# Patient Record
Sex: Female | Born: 1955 | Race: White | Hispanic: No | Marital: Single | Smoking: Current every day smoker
Health system: Southern US, Community
[De-identification: ages and names within clinical notes are randomized; demographics above are authoritative.]

## PROBLEM LIST (undated history)

## (undated) DIAGNOSIS — F32A Depression, unspecified: Secondary | ICD-10-CM

## (undated) DIAGNOSIS — Z5189 Encounter for other specified aftercare: Secondary | ICD-10-CM

## (undated) DIAGNOSIS — F4310 Post-traumatic stress disorder, unspecified: Secondary | ICD-10-CM

## (undated) DIAGNOSIS — C801 Malignant (primary) neoplasm, unspecified: Secondary | ICD-10-CM

## (undated) DIAGNOSIS — G4700 Insomnia, unspecified: Secondary | ICD-10-CM

## (undated) DIAGNOSIS — I739 Peripheral vascular disease, unspecified: Secondary | ICD-10-CM

## (undated) DIAGNOSIS — F419 Anxiety disorder, unspecified: Secondary | ICD-10-CM

## (undated) DIAGNOSIS — D219 Benign neoplasm of connective and other soft tissue, unspecified: Secondary | ICD-10-CM

## (undated) DIAGNOSIS — K219 Gastro-esophageal reflux disease without esophagitis: Secondary | ICD-10-CM

## (undated) DIAGNOSIS — E785 Hyperlipidemia, unspecified: Secondary | ICD-10-CM

## (undated) DIAGNOSIS — IMO0002 Reserved for concepts with insufficient information to code with codable children: Secondary | ICD-10-CM

## (undated) DIAGNOSIS — D649 Anemia, unspecified: Secondary | ICD-10-CM

## (undated) DIAGNOSIS — R7303 Prediabetes: Secondary | ICD-10-CM

## (undated) DIAGNOSIS — F329 Major depressive disorder, single episode, unspecified: Secondary | ICD-10-CM

## (undated) DIAGNOSIS — T7840XA Allergy, unspecified, initial encounter: Secondary | ICD-10-CM

## (undated) DIAGNOSIS — Z9289 Personal history of other medical treatment: Secondary | ICD-10-CM

## (undated) DIAGNOSIS — I10 Essential (primary) hypertension: Secondary | ICD-10-CM

## (undated) HISTORY — DX: Allergy, unspecified, initial encounter: T78.40XA

## (undated) HISTORY — PX: KNEE ARTHROSCOPY: SUR90

## (undated) HISTORY — DX: Anemia, unspecified: D64.9

## (undated) HISTORY — DX: Benign neoplasm of connective and other soft tissue, unspecified: D21.9

## (undated) HISTORY — DX: Prediabetes: R73.03

## (undated) HISTORY — DX: Anxiety disorder, unspecified: F41.9

## (undated) HISTORY — DX: Encounter for other specified aftercare: Z51.89

## (undated) HISTORY — PX: COLONOSCOPY: SHX174

---

## 1980-02-11 DIAGNOSIS — Z5189 Encounter for other specified aftercare: Secondary | ICD-10-CM

## 1980-02-11 HISTORY — DX: Encounter for other specified aftercare: Z51.89

## 1980-02-11 HISTORY — PX: OTHER SURGICAL HISTORY: SHX169

## 1997-06-02 ENCOUNTER — Observation Stay (HOSPITAL_COMMUNITY): Admission: EM | Admit: 1997-06-02 | Discharge: 1997-06-03 | Payer: Self-pay | Admitting: Emergency Medicine

## 1999-09-17 ENCOUNTER — Encounter: Admission: RE | Admit: 1999-09-17 | Discharge: 1999-09-17 | Payer: Self-pay | Admitting: Family Medicine

## 1999-09-17 ENCOUNTER — Encounter: Payer: Self-pay | Admitting: Family Medicine

## 2001-04-11 ENCOUNTER — Encounter: Payer: Self-pay | Admitting: Emergency Medicine

## 2001-04-11 ENCOUNTER — Emergency Department (HOSPITAL_COMMUNITY): Admission: EM | Admit: 2001-04-11 | Discharge: 2001-04-11 | Payer: Self-pay | Admitting: Emergency Medicine

## 2001-06-30 ENCOUNTER — Encounter: Payer: Self-pay | Admitting: Orthopaedic Surgery

## 2001-06-30 ENCOUNTER — Ambulatory Visit (HOSPITAL_COMMUNITY): Admission: RE | Admit: 2001-06-30 | Discharge: 2001-06-30 | Payer: Self-pay | Admitting: Orthopaedic Surgery

## 2001-09-08 ENCOUNTER — Encounter: Payer: Self-pay | Admitting: Internal Medicine

## 2001-09-08 ENCOUNTER — Other Ambulatory Visit: Admission: RE | Admit: 2001-09-08 | Discharge: 2001-09-08 | Payer: Self-pay | Admitting: Internal Medicine

## 2001-09-08 ENCOUNTER — Ambulatory Visit (HOSPITAL_COMMUNITY): Admission: RE | Admit: 2001-09-08 | Discharge: 2001-09-08 | Payer: Self-pay | Admitting: Internal Medicine

## 2001-12-17 ENCOUNTER — Encounter: Admission: RE | Admit: 2001-12-17 | Discharge: 2001-12-17 | Payer: Self-pay | Admitting: Family Medicine

## 2002-02-15 ENCOUNTER — Encounter: Admission: RE | Admit: 2002-02-15 | Discharge: 2002-02-15 | Payer: Self-pay | Admitting: Sports Medicine

## 2002-03-15 ENCOUNTER — Encounter: Admission: RE | Admit: 2002-03-15 | Discharge: 2002-03-15 | Payer: Self-pay | Admitting: Sports Medicine

## 2003-07-18 ENCOUNTER — Encounter: Admission: RE | Admit: 2003-07-18 | Discharge: 2003-07-18 | Payer: Self-pay | Admitting: Sports Medicine

## 2003-12-29 ENCOUNTER — Ambulatory Visit: Payer: Self-pay | Admitting: Sports Medicine

## 2004-03-05 ENCOUNTER — Ambulatory Visit: Payer: Self-pay | Admitting: Sports Medicine

## 2004-09-13 ENCOUNTER — Other Ambulatory Visit: Admission: RE | Admit: 2004-09-13 | Discharge: 2004-09-13 | Payer: Self-pay | Admitting: Internal Medicine

## 2004-11-20 ENCOUNTER — Ambulatory Visit: Payer: Self-pay | Admitting: Sports Medicine

## 2004-11-20 ENCOUNTER — Encounter: Admission: RE | Admit: 2004-11-20 | Discharge: 2004-11-20 | Payer: Self-pay | Admitting: Sports Medicine

## 2004-11-23 ENCOUNTER — Encounter: Admission: RE | Admit: 2004-11-23 | Discharge: 2004-11-23 | Payer: Self-pay | Admitting: Sports Medicine

## 2004-12-03 ENCOUNTER — Ambulatory Visit: Payer: Self-pay | Admitting: Sports Medicine

## 2004-12-06 ENCOUNTER — Emergency Department (HOSPITAL_COMMUNITY): Admission: EM | Admit: 2004-12-06 | Discharge: 2004-12-06 | Payer: Self-pay | Admitting: Emergency Medicine

## 2006-04-07 ENCOUNTER — Ambulatory Visit: Payer: Self-pay | Admitting: Sports Medicine

## 2006-11-05 ENCOUNTER — Other Ambulatory Visit: Admission: RE | Admit: 2006-11-05 | Discharge: 2006-11-05 | Payer: Self-pay | Admitting: Internal Medicine

## 2007-03-31 ENCOUNTER — Ambulatory Visit: Payer: Self-pay | Admitting: Sports Medicine

## 2007-03-31 DIAGNOSIS — S339XXA Sprain of unspecified parts of lumbar spine and pelvis, initial encounter: Secondary | ICD-10-CM | POA: Insufficient documentation

## 2007-03-31 DIAGNOSIS — S335XXA Sprain of ligaments of lumbar spine, initial encounter: Secondary | ICD-10-CM

## 2007-04-23 ENCOUNTER — Telehealth: Payer: Self-pay | Admitting: *Deleted

## 2007-12-07 ENCOUNTER — Ambulatory Visit: Payer: Self-pay | Admitting: Sports Medicine

## 2007-12-07 DIAGNOSIS — IMO0002 Reserved for concepts with insufficient information to code with codable children: Secondary | ICD-10-CM | POA: Insufficient documentation

## 2007-12-30 ENCOUNTER — Ambulatory Visit: Payer: Self-pay | Admitting: Sports Medicine

## 2008-06-28 ENCOUNTER — Ambulatory Visit: Payer: Self-pay | Admitting: Sports Medicine

## 2008-06-28 DIAGNOSIS — M25539 Pain in unspecified wrist: Secondary | ICD-10-CM | POA: Insufficient documentation

## 2009-05-29 ENCOUNTER — Encounter: Admission: RE | Admit: 2009-05-29 | Discharge: 2009-05-29 | Payer: Self-pay | Admitting: Internal Medicine

## 2009-08-21 ENCOUNTER — Ambulatory Visit: Payer: Self-pay | Admitting: Family Medicine

## 2009-08-21 DIAGNOSIS — M5412 Radiculopathy, cervical region: Secondary | ICD-10-CM | POA: Insufficient documentation

## 2009-12-25 ENCOUNTER — Telehealth: Payer: Self-pay | Admitting: Sports Medicine

## 2010-01-29 ENCOUNTER — Ambulatory Visit: Payer: Self-pay | Admitting: Sports Medicine

## 2010-03-14 NOTE — Assessment & Plan Note (Signed)
Summary: [facility]   Vital Signs:  Patient profile:   55 year old female Height:      66 inches Weight:      165 pounds Pulse rate:   70 / minute Resp:     16 per minute  History of Present Illness: pleasant 55 year old patient of Dr. Jettie Booze who has a long-standing history of cervical spine issues with radiculopathy and chronic pain. This is been well controlled with the gabapentin which she ran out of her on, she started having more pain and neuropathic symptoms. She also takes him Vicoprofen intermittently, but typically take about half a tablet of this irregularly.  She also does do some self mechanical traction with 2 different devices and occasionally does some manipulation by chiropractic.  Review of systems as above. No fevers or chills.  GEN: Well-developed,well-nourished,in no acute distress; alert,appropriate and cooperative throughout examination HEENT: Normocephalic and atraumatic without obvious abnormalities. No apparent alopecia or balding. Ears, externally no deformities PULM: Breathing comfortably in no respiratory distress EXT: No clubbing, cyanosis, or edema PSYCH: Normally interactive. Cooperative during the interview. Pleasant. Friendly and conversant. Not anxious or depressed appearing. Normal, full affect.   Range of motion cervical spine is actually fairly good and well preserved, however the patient does have a positive Spurling's maneuver the causes radiculopathy. Some pain and tenderness in the paracervical region around C4. Some trapezius tightness.  Allergies: No Known Drug Allergies  Past History:  Past medical, surgical, family and social histories (including risk factors) reviewed, and no changes noted (except as noted below).  Past Medical History: Reviewed history from 03/31/2007 and no changes required. anxiety on xanax 0.5 mg hs  Family History: Reviewed history and no changes required.  Social History: Reviewed history from  12/30/2007 and no changes required. teaches dance at [employer]   Impression & Recommendations:  Problem # 1:  CERVICAL RADICULOPATHY (ICD-723.4) reasonable to continue with chronic Neurontin, and occasional Vicoprofen use is reasonable as well.  I did review the patient's plain x-rays as well as her cervical spine MRI, there clearly is a large diffuse disc bulge.  Complete Medication List: 1)  Vicoprofen 7.5-200 Mg Tabs (Hydrocodone-ibuprofen) .... Take 1 tablet by mouth four times a day 2)  Neurontin 300 Mg Caps (Gabapentin) .... Take one capsule three times a day 3)  Voltaren 1 % Gel (Diclofenac sodium) .... Apply 4g to affected area qid Prescriptions: NEURONTIN 300 MG  CAPS (GABAPENTIN) take one capsule three times a day  #90 x 11   Entered and Authorized by:   Hannah Beat MD   Signed by:   Hannah Beat MD on 08/21/2009   Method used:   Print then Give to Patient   RxID:   [id number] VICOPROFEN 7.5-200 MG TABS (HYDROCODONE-IBUPROFEN) Take 1 tablet by mouth four times a day  #100 x 0   Entered and Authorized by:   Hannah Beat MD   Signed by:   Hannah Beat MD on 08/21/2009   Method used:   Print then Give to Patient   RxID:   [id number]

## 2010-03-14 NOTE — Progress Notes (Signed)
Summary: hydrocodone  Phone Note Refill Request Message from:  Fax from Pharmacy on December 25, 2009 1:53 PM  Refills Requested: Medication #1:  VICOPROFEN 7.5-200 MG TABS Take 1 tablet by mouth four times a day   Last Refilled: 08/21/2009 Refill request from [facility] [address] [phone].   Initial call taken by: Melody Comas,  December 25, 2009 2:20 PM  Follow-up for Phone Call        Call received at [facility], but patient of Dr. Darrick Penna - ?PCP , will forward to him. Hannah Beat MD  December 25, 2009 2:51 PM   Additional Follow-up for Phone Call Additional follow up Details #1::        Called in per Dr. Darrick Penna. Additional Follow-up by: Rochele Pages RN,  December 25, 2009 5:05 PM    Prescriptions: VICOPROFEN 7.5-200 MG TABS (HYDROCODONE-IBUPROFEN) Take 1 tablet by mouth four times a day  #100 x 1   Entered by:   Rochele Pages RN   Authorized by:   Enid Baas MD   Signed by:   Rochele Pages RN on 12/25/2009   Method used:   Telephoned to ...       [facility] # [phone]* (retail)       [address]       [place], [place]  [address]       Ph: [phone]       Fax: [phone]   RxID:   [id number]

## 2010-04-22 ENCOUNTER — Encounter: Payer: Self-pay | Admitting: *Deleted

## 2010-08-12 ENCOUNTER — Emergency Department (HOSPITAL_BASED_OUTPATIENT_CLINIC_OR_DEPARTMENT_OTHER)
Admission: EM | Admit: 2010-08-12 | Discharge: 2010-08-12 | Disposition: A | Discharge status: Home / Self Care | Payer: BC Managed Care – PPO | Attending: Emergency Medicine | Admitting: Emergency Medicine

## 2010-08-12 DIAGNOSIS — T63461A Toxic effect of venom of wasps, accidental (unintentional), initial encounter: Secondary | ICD-10-CM | POA: Insufficient documentation

## 2010-08-12 DIAGNOSIS — T6391XA Toxic effect of contact with unspecified venomous animal, accidental (unintentional), initial encounter: Secondary | ICD-10-CM | POA: Insufficient documentation

## 2010-08-12 DIAGNOSIS — Z79899 Other long term (current) drug therapy: Secondary | ICD-10-CM | POA: Insufficient documentation

## 2010-08-28 ENCOUNTER — Other Ambulatory Visit: Payer: Self-pay | Admitting: Family Medicine

## 2010-08-28 NOTE — Telephone Encounter (Signed)
Refused Rx.  Notified pharmacy that patient should contact Dr. Darrick Penna for refills.

## 2010-08-28 NOTE — Telephone Encounter (Signed)
I refilled last time, but Dr. Darrick Penna has seen for years. I would rather pass on to [facility].

## 2010-08-29 ENCOUNTER — Other Ambulatory Visit: Payer: Self-pay | Admitting: *Deleted

## 2010-08-29 DIAGNOSIS — M5412 Radiculopathy, cervical region: Secondary | ICD-10-CM

## 2010-08-29 MED ORDER — GABAPENTIN 300 MG PO CAPS: 300.0000 mg | ORAL_CAPSULE | Freq: Three times a day (TID) | ORAL | Status: DC

## 2010-09-12 ENCOUNTER — Other Ambulatory Visit (HOSPITAL_COMMUNITY)
Admission: RE | Admit: 2010-09-12 | Discharge: 2010-09-12 | Disposition: A | Discharge status: Home / Self Care | Payer: BC Managed Care – PPO | Source: Ambulatory Visit | Attending: Internal Medicine | Admitting: Internal Medicine

## 2010-09-12 ENCOUNTER — Encounter: Payer: Self-pay | Admitting: Sports Medicine

## 2010-09-12 ENCOUNTER — Other Ambulatory Visit: Payer: Self-pay | Admitting: Internal Medicine

## 2010-09-12 ENCOUNTER — Ambulatory Visit (INDEPENDENT_AMBULATORY_CARE_PROVIDER_SITE_OTHER): Payer: BC Managed Care – PPO | Admitting: Sports Medicine

## 2010-09-12 VITALS — BP 125/87 | HR 94 | Ht 66.0 in | Wt 166.0 lb

## 2010-09-12 DIAGNOSIS — Z01419 Encounter for gynecological examination (general) (routine) without abnormal findings: Secondary | ICD-10-CM | POA: Insufficient documentation

## 2010-09-12 DIAGNOSIS — S339XXA Sprain of unspecified parts of lumbar spine and pelvis, initial encounter: Secondary | ICD-10-CM

## 2010-09-12 DIAGNOSIS — M5412 Radiculopathy, cervical region: Secondary | ICD-10-CM

## 2010-09-12 DIAGNOSIS — F4323 Adjustment disorder with mixed anxiety and depressed mood: Secondary | ICD-10-CM | POA: Insufficient documentation

## 2010-09-12 NOTE — Progress Notes (Signed)
  Subjective:    Patient ID: Gabriela [name], female    DOB: April 26, 1955, 55 y.o.   MRN: [id number]  HPI Patient enters with several concerns. We have treated her for cervical radiculopathy and also for low back pain in the past. If she does not take her gabapentin she has had more difficulty with cervical radiculopathy primarily but also noticed that he's she gets more low back tightness.  During the past 2 years she's had a significant increase in personal stressors and job stresses. This has culminated in the breakup of long-term marriage. Since the stress has increased in the last 3 months she has had a number of panic attacks. Her primary care physician has done a good job of managing days in helping her titrate and lessen the amount of Xanax that she uses. Patient however has declined using antidepressant medicine but she feels that most of the problems are situational. She has agreed to go to counseling and has a Office manager in [place]. Most recently she has also seen Daiva Eves is an MSW therapist and he works with traumatic stress situations.  Counseling does seem to be helping. However she continues to have some frequent irregular heartbeats symptoms of anxiety and sometimes chest tightness. EKGs and evaluation by her primary care physician and the emergency department on 1 visit have been unremarkable. She now is having more difficulty sleeping through the like without waking up. This increases the musculoskeletal symptoms that she has both in her low back and neck.  On a secondary note she has some abdominal strain while she was working in her garden. She wants some advice regarding this.  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   Review of Systems     Objective:   Physical Exam  Tearful but appropriate and she does show appropriate affect during the course of her  discussion  Coronary exam is repeated today and there are very rare irregular beats but no significant abnormalities noted  Neck motion is good and gross neurological evaluation is unremarkable  Neck motion is good  Abdominal exam shows some tightness and even some mild tenderness along the lateral rectus sheath to the left at the insertion of the oblique muscles      Assessment & Plan:

## 2010-09-12 NOTE — Assessment & Plan Note (Signed)
Her acute injury has resolved but she now has some abdominal strain. Thank urged in a series of abdominal and low back exercises to help prevent these type of injuries.

## 2010-09-12 NOTE — Assessment & Plan Note (Signed)
She seemed to respond very well to gabapentin so I am hoping the increased dose of this will allow her to get better rest. If this is the case the counseling may be enough to get her through the situational problems. She can also try weaning the Xanax if the gabapentin does not help her rest. If she is not responding adequately she should return to her primary care physician for further assessment of this.

## 2010-09-12 NOTE — Patient Instructions (Signed)
Check out DASH diet at [facility] website  Start with Gabapentin 300 in AM and 600 PM for 1 week  If improving Then 300 AM/ 300 Noon and 600 PM for 2 weeks  If needed you can titrate up to 600 three times a day increasing dose by no more than 300 per week  Xanax After the Gabapentin has been stable for 1 week Then Xanax .5 at night alternate with .25 (half tab)  That is for 2 weeks  If stable then Xanax 0.25 nightly for 1 month  Abdominal exercises as described Don't push through soreness Keep up back program

## 2010-09-12 NOTE — Assessment & Plan Note (Signed)
I would like to increase her dose of gabapentin as documented in the patient instructions  If she gets better sleep and more muscle relaxation I think she will have less radicular symptoms

## 2010-09-19 ENCOUNTER — Ambulatory Visit (INDEPENDENT_AMBULATORY_CARE_PROVIDER_SITE_OTHER): Payer: BC Managed Care – PPO | Admitting: Sports Medicine

## 2010-09-19 VITALS — BP 133/83

## 2010-09-19 DIAGNOSIS — S300XXA Contusion of lower back and pelvis, initial encounter: Secondary | ICD-10-CM

## 2010-09-19 DIAGNOSIS — M533 Sacrococcygeal disorders, not elsewhere classified: Secondary | ICD-10-CM

## 2010-09-21 DIAGNOSIS — S300XXA Contusion of lower back and pelvis, initial encounter: Secondary | ICD-10-CM | POA: Insufficient documentation

## 2010-09-21 NOTE — Progress Notes (Signed)
  Subjective:    Patient ID: Gabriela [name], female    DOB: 1955-05-27, 55 y.o.   MRN: [id number]  HPI  Gabriela [name] is a pleasant 55 yo patient coming today complaining of coccyx pain after falling on her bottom while walking down steps in the pool on Monday 09/16/10. 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 Her pain is localized in the coccyx area, 4/10 intensity, improved with rest, ice, motrin  and not putting pressure in her coccyx, worsen by sitting position and by certain movements. No numbness or tingling on her legs.  She is using a shoe design pillow to take pressure from her coccyx while sitting.  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 She starts school in 2 weeks.  No past medical history on file. Current Outpatient Prescriptions on File Prior to Visit  Medication Sig Dispense Refill  . ALPRAZolam (XANAX) 0.5 MG tablet Take 0.5 mg by mouth as needed.      Marland Kitchen EPIPEN 2-PAK 0.3 MG/0.3ML DEVI as needed.      . gabapentin (NEURONTIN) 300 MG capsule Take 1 capsule (300 mg total) by mouth 3 (three) times daily.  90 capsule  0  . HYDROcodone-ibuprofen (VICOPROFEN) 7.5-200 MG per tablet Take 1 tablet by mouth 4 (four) times daily.         Allergies  Allergen Reactions  . Prednisone Other (See Comments)    Gets very manic     Review of Systems  Musculoskeletal: Negative for gait problem.       Coccyx pain with HPI  Skin: Negative for color change, pallor and rash.       Objective:   Physical Exam  Constitutional: She appears well-developed and well-nourished.       BP 133/83   Eyes: EOM are normal.  Pulmonary/Chest: Effort normal.  Musculoskeletal:       Sacral area with no swelling, no hematoma. There is TTP in the coccyx at the level of the tip and second coccyx vertebrae. No crepitus. There is increase anterior curvature of the coccyx. Sensation intact distally. LB w/ no pain,  no TTP. Normal gait  Skin: No rash noted. No erythema. No pallor.  Psychiatric: She has a normal mood and affect. Judgment normal.    MSK U/S: with hypoechoic image in between 1st and 2nd coccyx vertebrae, insinuating post traumatic swelling in that area. Coincide w/ area of TTP       Assessment & Plan:   1. Coccyx contusion   2. Coccyxdynia    Recommended to continue using the shoe horse pillow. Recommended to continue motrin for pain control. Recommended to avoid direct pressure on the sacral area. Physical activity as tolerated. Avoid hihg impact activity F/U in 4 weeks

## 2010-09-26 ENCOUNTER — Encounter: Payer: Self-pay | Admitting: Family Medicine

## 2010-09-26 ENCOUNTER — Ambulatory Visit (INDEPENDENT_AMBULATORY_CARE_PROVIDER_SITE_OTHER): Payer: BC Managed Care – PPO | Admitting: Family Medicine

## 2010-09-26 VITALS — BP 113/77 | HR 73 | Ht 66.0 in | Wt 157.0 lb

## 2010-09-26 DIAGNOSIS — S300XXA Contusion of lower back and pelvis, initial encounter: Secondary | ICD-10-CM

## 2010-09-26 NOTE — Progress Notes (Signed)
  Subjective:    Patient ID: Gabriela [name], female    DOB: 16-Nov-1955, 55 y.o.   MRN: [id number]  HPI Gabriela [name] is coming today to F/U on her coccyx fracture. She had her initial injury on 09/16/10. She states that her pain is the same, dull ache, 4/10, in the coccyx area, worsen by sitting and putting pressure on her coccyx, improved by  Removing pressure from her coccyx. She has been able to do exercises and stretches with mild discomfort. No numbness or tingling. No limping. She is able to defecate.  Patient Active Problem List  Diagnoses  . WRIST PAIN, LEFT  . CERVICAL RADICULOPATHY  . PES ANSERINUS TENDINITIS OR BURSITIS  . LUMBOSACRAL STRAIN, ACUTE  . Adjustment disorder with mixed anxiety and depressed mood  . Coccyx contusion     Current Outpatient Prescriptions on File Prior to Visit  Medication Sig Dispense Refill  . ALPRAZolam (XANAX) 0.5 MG tablet Take 0.5 mg by mouth as needed.      Marland Kitchen EPIPEN 2-PAK 0.3 MG/0.3ML DEVI as needed.      . gabapentin (NEURONTIN) 300 MG capsule Take 1 capsule (300 mg total) by mouth 3 (three) times daily.  90 capsule  0  . HYDROcodone-ibuprofen (VICOPROFEN) 7.5-200 MG per tablet Take 1 tablet by mouth 4 (four) times daily.         Allergies  Allergen Reactions  . Prednisone Other (See Comments)    Gets very manic     Review of Systems  Constitutional: Negative for fever, chills, diaphoresis and fatigue.  Gastrointestinal: Negative for constipation.  Genitourinary: Negative for vaginal bleeding.  Musculoskeletal:       Coccyx pain with hpi  Skin: Negative for color change, pallor and rash.  Neurological: Negative for weakness and numbness.       Objective:   Physical Exam  Constitutional: She appears well-developed and well-nourished.       BP 113/77  Pulse 73  Ht 5\' 6"  (1.676 m)  Wt 157 lb (71.215 kg)  BMI 25.34 kg/m2   Pulmonary/Chest: Breath sounds normal.  Musculoskeletal:       Sacral area with intact skin, no  swelling, no hematomas. TTP at the distal coccyx, improved compare with previous visit exam. Sensation intact distally.   Neurological: She is alert.  Skin: Skin is warm and dry. No rash noted. No erythema. No pallor.  Psychiatric: She has a normal mood and affect. Judgment normal.       Assessment & Plan:   1. Coccyx contusion    Cont. Using pillow to decrease coccyx pressure. Motrin prn pain. Cryotherapy. Physical activity with moderation. expectation resolution by six weeks. F/u in 4 weeks if still symptomatic

## 2010-10-22 ENCOUNTER — Ambulatory Visit: Payer: BC Managed Care – PPO | Admitting: Sports Medicine

## 2010-10-31 ENCOUNTER — Ambulatory Visit (INDEPENDENT_AMBULATORY_CARE_PROVIDER_SITE_OTHER): Payer: BC Managed Care – PPO | Admitting: Sports Medicine

## 2010-10-31 VITALS — BP 110/70

## 2010-10-31 DIAGNOSIS — M533 Sacrococcygeal disorders, not elsewhere classified: Secondary | ICD-10-CM

## 2010-10-31 DIAGNOSIS — G8929 Other chronic pain: Secondary | ICD-10-CM

## 2010-10-31 DIAGNOSIS — S300XXA Contusion of lower back and pelvis, initial encounter: Secondary | ICD-10-CM | POA: Insufficient documentation

## 2010-10-31 NOTE — Progress Notes (Signed)
  Subjective:    Patient ID: Gabriela [name], female    DOB: [birth year], 55 y.o.   MRN: [id number]  HPI Coming to F/u on her coccyx contusion. She is doing 95% better. 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 She has mild pain with one or two activities where she is putting diretc pressure on her coccyx area. She denies any pain other way, walking without a limp. No numbness or tingling distally.  She states that she has been having R SI joint pain in the past and was treated by a chiropractor which she does not see anymore for economic reasons. She has tried her stretches but would like to know what else can she do.  Patient Active Problem List  Diagnoses  . WRIST PAIN, LEFT  . CERVICAL RADICULOPATHY  . PES ANSERINUS TENDINITIS OR BURSITIS  . LUMBOSACRAL STRAIN, ACUTE  . Adjustment disorder with mixed anxiety and depressed mood  . Coccyx contusion  . Coccyx contusion   Current Outpatient Prescriptions on File Prior to Visit  Medication Sig Dispense Refill  . ALPRAZolam (XANAX) 0.5 MG tablet Take 0.5 mg by mouth as needed.      Marland Kitchen EPIPEN 2-PAK 0.3 MG/0.3ML DEVI as needed.      . gabapentin (NEURONTIN) 300 MG capsule Take 1 capsule (300 mg total) by mouth 3 (three) times daily.  90 capsule  0  . HYDROcodone-ibuprofen (VICOPROFEN) 7.5-200 MG per tablet Take 1 tablet by mouth 4 (four) times daily.         Allergies  Allergen Reactions  . Prednisone Other (See Comments)    Gets very manic      Review of Systems  Constitutional: Negative for fever, chills and fatigue.  HENT: Negative for neck pain.   Musculoskeletal: Negative for back pain, joint swelling and gait problem.  Neurological: Negative for tremors, weakness and numbness.       Objective:   Physical Exam  Constitutional: She appears well-developed and well-nourished.       BP 110/70   Eyes: EOM are normal.  Neck: Normal range of motion. Neck supple.  Pulmonary/Chest: Effort normal.    Musculoskeletal:       Coccix no TTP Hips and LB with FROM. SI joint with good mobility, mild positive faber test in the right . Sensation intact distally.   Neurological: She is alert.  Skin: Skin is warm. No rash noted. No erythema. No pallor.  Psychiatric: She has a normal mood and affect.          Assessment & Plan:   1. Coccyx contusion   2. Chronic SI joint pain    SI joint stretches. Low back strengthening  Exercises. Avoid direct pressure on her coccyx. F/U PRN

## 2010-11-12 ENCOUNTER — Other Ambulatory Visit: Payer: Self-pay | Admitting: Sports Medicine

## 2010-12-02 ENCOUNTER — Ambulatory Visit (INDEPENDENT_AMBULATORY_CARE_PROVIDER_SITE_OTHER): Payer: BC Managed Care – PPO | Admitting: Family Medicine

## 2010-12-02 ENCOUNTER — Encounter: Payer: Self-pay | Admitting: Family Medicine

## 2010-12-02 VITALS — BP 114/78 | HR 87

## 2010-12-02 DIAGNOSIS — S2239XA Fracture of one rib, unspecified side, initial encounter for closed fracture: Secondary | ICD-10-CM

## 2010-12-02 NOTE — Patient Instructions (Signed)
You had some right anterior rib fractures. I would recommend ibuprofen. Taking the over-the-counter dose, each pill is 200 mg. The maximum dose she can have in one 24-hour period is 2400 mg. You may take for over-the-counter tablets every 8 hours for he may take 3 over-the-counter tablets every 6 hrs. watch out for stomach upset and if you get that to stop the medication. If you have any problems please feel free to give [place] a call. I suspect it will take about 6 weeks to heal.

## 2010-12-02 NOTE — Progress Notes (Signed)
  Subjective:    Patient ID: Gabriela [name], female    DOB: 1956/01/18, 55 y.o.   MRN: [id number]  HPI  Date of injury 12/01/2010 Patient onto her right ribs last night. Got tangled up with her dog. He right rib pain. No shortness of breath. No loss of consciousness. 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 She did take one Vicoprofen prior to her class. She's quite concerned that she has rib fractures.  Review of Systems Denies hemoptysis, denies shortness of breath. Denies fever.    Objective:   Physical Exam   Vital signs reviewed. GENERAL: Well developed, well nourished, no acute distress LUNGS: Clear to auscultation bilaterally MSK: Tenderness to palpation in the right lower costal margin. SKIN: Ecchymoses right lower costal margin. No deformity.     Assessment & Plan:  Rib contusion/fracture. Long discussion with her regarding x-rays. Eventually talked her out of the need for this. I would treat her as if she has rib fractures. NSAIDs. She continues rib binder for activities. The patient instructions.

## 2011-01-27 ENCOUNTER — Other Ambulatory Visit: Payer: Self-pay | Admitting: Family Medicine

## 2011-01-27 NOTE — Telephone Encounter (Signed)
I saw her for rib fractures in October. Unclear why I would be giving her #100 vicodin--I think she has seen Dr Aggie Cosier multiple times---maybe he has done this in past. Previous rx of vicodin say only "historicalprovider". Please ask Dr Darrick Penna

## 2011-01-27 NOTE — Telephone Encounter (Signed)
This is not a patient at our office °

## 2011-01-27 NOTE — Telephone Encounter (Signed)
Given just saw Dr. Jennette Kettle, will send to her.

## 2011-01-28 ENCOUNTER — Other Ambulatory Visit: Payer: Self-pay | Admitting: *Deleted

## 2011-01-28 MED ORDER — HYDROCODONE-IBUPROFEN 7.5-200 MG PO TABS: 1.0000 | ORAL_TABLET | Freq: Four times a day (QID) | ORAL | Status: DC

## 2011-02-06 ENCOUNTER — Other Ambulatory Visit: Payer: Self-pay | Admitting: Sports Medicine

## 2011-03-09 ENCOUNTER — Emergency Department (HOSPITAL_COMMUNITY)
Admission: EM | Admit: 2011-03-09 | Discharge: 2011-03-09 | Disposition: A | Discharge status: Other Acute Inpatient Hospital | Payer: BC Managed Care – PPO | Attending: Emergency Medicine | Admitting: Emergency Medicine

## 2011-03-09 ENCOUNTER — Inpatient Hospital Stay (HOSPITAL_COMMUNITY)
Admission: RE | Admit: 2011-03-09 | Discharge: 2011-03-12 | DRG: 430 | Disposition: A | Discharge status: Home / Self Care | Payer: BC Managed Care – PPO | Source: Ambulatory Visit | Attending: Psychiatry | Admitting: Psychiatry

## 2011-03-09 ENCOUNTER — Encounter (HOSPITAL_COMMUNITY): Payer: Self-pay | Admitting: *Deleted

## 2011-03-09 ENCOUNTER — Encounter (HOSPITAL_COMMUNITY): Payer: Self-pay | Admitting: Behavioral Health

## 2011-03-09 DIAGNOSIS — F3289 Other specified depressive episodes: Secondary | ICD-10-CM | POA: Insufficient documentation

## 2011-03-09 DIAGNOSIS — T50901A Poisoning by unspecified drugs, medicaments and biological substances, accidental (unintentional), initial encounter: Secondary | ICD-10-CM | POA: Insufficient documentation

## 2011-03-09 DIAGNOSIS — Z79899 Other long term (current) drug therapy: Secondary | ICD-10-CM

## 2011-03-09 DIAGNOSIS — F411 Generalized anxiety disorder: Secondary | ICD-10-CM

## 2011-03-09 DIAGNOSIS — T50992A Poisoning by other drugs, medicaments and biological substances, intentional self-harm, initial encounter: Secondary | ICD-10-CM | POA: Insufficient documentation

## 2011-03-09 DIAGNOSIS — F329 Major depressive disorder, single episode, unspecified: Secondary | ICD-10-CM | POA: Insufficient documentation

## 2011-03-09 DIAGNOSIS — F32A Depression, unspecified: Secondary | ICD-10-CM

## 2011-03-09 DIAGNOSIS — T50902A Poisoning by unspecified drugs, medicaments and biological substances, intentional self-harm, initial encounter: Secondary | ICD-10-CM

## 2011-03-09 DIAGNOSIS — IMO0002 Reserved for concepts with insufficient information to code with codable children: Secondary | ICD-10-CM

## 2011-03-09 DIAGNOSIS — F339 Major depressive disorder, recurrent, unspecified: Principal | ICD-10-CM

## 2011-03-09 DIAGNOSIS — R45851 Suicidal ideations: Secondary | ICD-10-CM | POA: Insufficient documentation

## 2011-03-09 DIAGNOSIS — G4700 Insomnia, unspecified: Secondary | ICD-10-CM

## 2011-03-09 DIAGNOSIS — Z888 Allergy status to other drugs, medicaments and biological substances status: Secondary | ICD-10-CM

## 2011-03-09 DIAGNOSIS — F4310 Post-traumatic stress disorder, unspecified: Secondary | ICD-10-CM

## 2011-03-09 DIAGNOSIS — T50991A Poisoning by other drugs, medicaments and biological substances, accidental (unintentional), initial encounter: Secondary | ICD-10-CM

## 2011-03-09 HISTORY — DX: Post-traumatic stress disorder, unspecified: F43.10

## 2011-03-09 HISTORY — DX: Depression, unspecified: F32.A

## 2011-03-09 HISTORY — DX: Insomnia, unspecified: G47.00

## 2011-03-09 HISTORY — DX: Reserved for concepts with insufficient information to code with codable children: IMO0002

## 2011-03-09 HISTORY — DX: Major depressive disorder, single episode, unspecified: F32.9

## 2011-03-09 LAB — CBC
HCT: 42.3 % (ref 36.0–46.0)
MCH: 31.2 pg (ref 26.0–34.0)
MCHC: 34.3 g/dL (ref 30.0–36.0)
Platelets: 276 10*3/uL (ref 150–400)
RDW: 12.6 % (ref 11.5–15.5)
WBC: 5.9 10*3/uL (ref 4.0–10.5)

## 2011-03-09 LAB — COMPREHENSIVE METABOLIC PANEL
ALT: 20 U/L (ref 0–35)
AST: 22 U/L (ref 0–37)
CO2: 28 mEq/L (ref 19–32)
Calcium: 11 mg/dL — ABNORMAL HIGH (ref 8.4–10.5)
Chloride: 103 mEq/L (ref 96–112)
GFR calc non Af Amer: >90 mL/min (ref >90)
Glucose, Bld: 77 mg/dL (ref 70–99)
Sodium: 137 mEq/L (ref 135–145)
Total Bilirubin: 0.7 mg/dL (ref 0.3–1.2)
Total Protein: 7.7 g/dL (ref 6.0–8.3)

## 2011-03-09 LAB — URINALYSIS, ROUTINE W REFLEX MICROSCOPIC
Ketones, ur: NEGATIVE mg/dL
Nitrite: NEGATIVE
Protein, ur: NEGATIVE mg/dL

## 2011-03-09 LAB — RAPID URINE DRUG SCREEN, HOSP PERFORMED
Cocaine: NOT DETECTED
Tetrahydrocannabinol: NOT DETECTED

## 2011-03-09 LAB — DIFFERENTIAL
Basophils Relative: 1 % (ref 0–1)
Eosinophils Relative: 4 % (ref 0–5)
Lymphocytes Relative: 30 % (ref 12–46)
Neutro Abs: 3.3 10*3/uL (ref 1.7–7.7)
Neutrophils Relative %: 55 % (ref 43–77)

## 2011-03-09 LAB — ACETAMINOPHEN LEVEL: Acetaminophen (Tylenol), Serum: <15 ug/mL (ref 10–30)

## 2011-03-09 MED ORDER — ALUM & MAG HYDROXIDE-SIMETH 200-200-20 MG/5ML PO SUSP: 30.0000 mL | ORAL | Status: DC | PRN

## 2011-03-09 MED ORDER — GABAPENTIN 300 MG PO CAPS
300.0000 mg | ORAL_CAPSULE | Freq: Every day | ORAL | Status: DC
  Filled 2011-03-09: qty 1

## 2011-03-09 MED ORDER — DIAZEPAM 2 MG PO TABS: 4.0000 mg | ORAL_TABLET | Freq: Every day | ORAL | Status: DC

## 2011-03-09 MED ORDER — ZOLPIDEM TARTRATE 5 MG PO TABS: 5.0000 mg | ORAL_TABLET | Freq: Every evening | ORAL | Status: DC | PRN

## 2011-03-09 MED ORDER — MAGNESIUM HYDROXIDE 400 MG/5ML PO SUSP: 30.0000 mL | Freq: Every day | ORAL | Status: DC | PRN

## 2011-03-09 MED ORDER — ACETAMINOPHEN 325 MG PO TABS: 650.0000 mg | ORAL_TABLET | Freq: Four times a day (QID) | ORAL | Status: DC | PRN

## 2011-03-09 MED ORDER — ACETAMINOPHEN 325 MG PO TABS: 650.0000 mg | ORAL_TABLET | ORAL | Status: DC | PRN

## 2011-03-09 MED ORDER — CHLORDIAZEPOXIDE HCL 25 MG PO CAPS
25.0000 mg | ORAL_CAPSULE | Freq: Two times a day (BID) | ORAL | Status: DC | PRN
  Administered 2011-03-10: 25 mg via ORAL
  Filled 2011-03-09: qty 1

## 2011-03-09 MED ORDER — ONDANSETRON HCL 4 MG PO TABS: 4.0000 mg | ORAL_TABLET | Freq: Three times a day (TID) | ORAL | Status: DC | PRN

## 2011-03-09 MED ORDER — NICOTINE 21 MG/24HR TD PT24: 21.0000 mg | MEDICATED_PATCH | Freq: Every day | TRANSDERMAL | Status: DC | PRN

## 2011-03-09 MED ORDER — IBUPROFEN 200 MG PO TABS: 400.0000 mg | ORAL_TABLET | Freq: Three times a day (TID) | ORAL | Status: DC | PRN

## 2011-03-09 MED ORDER — LORAZEPAM 1 MG PO TABS: 1.0000 mg | ORAL_TABLET | Freq: Three times a day (TID) | ORAL | Status: DC | PRN

## 2011-03-09 NOTE — [facility] Counselor (Signed)
Spoke to [name] and she reports the following:  She was partner with pt for 20 yrs and they have been separated for 18 mths Pt wrote an 8 page suicide letter Pt has belongings boxed up at house with individual names on each box of who belongs are willed to when she dies Pt has notes on various items in house noting to whom the items go to when she dies Ex-Partner believes pt to be serious about suicide and reports she has no prior hx of SI attempts Ex-Partner also states pt does not believe in medication for psychiatric needs  Gabriela [name] completed assessment with patient.  Staffed with Tammy Sours after he assessed pt.

## 2011-03-09 NOTE — ED Notes (Signed)
Friend who found pt would like for someone to call her so she can provide information related to the Suicide attempt last night. Idelle Leech   [phone]

## 2011-03-09 NOTE — Progress Notes (Signed)
Pt has been accepted to [facility] by Dr. Elsie Saas to Dr. Orson Aloe to Bed 503-2. Support paperwork signed. EDP and pt's RN notified.

## 2011-03-09 NOTE — Tx Team (Signed)
Initial Interdisciplinary Treatment Plan  PATIENT STRENGTHS: (choose at least two) Average or above average intelligence Communication skills Motivation for treatment/growth  PATIENT STRESSORS: Financial difficulties Health problems Loss of Partner* Substance abuse Traumatic event   PROBLEM LIST: Problem List/Patient Goals Date to be addressed Date deferred Reason deferred Estimated date of resolution  Depression      Anxiety      Suicide Attempt      Substance Abuse                                     DISCHARGE CRITERIA:  Ability to meet basic life and health needs Adequate post-discharge living arrangements Improved stabilization in mood, thinking, and/or behavior Motivation to continue treatment in a less acute level of care Reduction of life-threatening or endangering symptoms to within safe limits Verbal commitment to aftercare and medication compliance   PRELIMINARY DISCHARGE PLAN:   PATIENT/FAMIILY INVOLVEMENT: This treatment plan has been presented to and reviewed with the patient, Gabriela [name], and/or family member, .  The patient and family have been given the opportunity to ask questions and make suggestions.  Rawad Bochicchio Shari Prows 03/09/2011, 11:16 PM

## 2011-03-09 NOTE — ED Provider Notes (Addendum)
  8:47 PM Patient accepted for transfer to behavioral health. Patient stable for transfer and EMTALA completed Gabriela Numbers, MD 03/09/11 1930  Gabriela Numbers, MD 03/09/11 2049

## 2011-03-09 NOTE — ED Notes (Signed)
Pt from home via EMS after former partner found a note on pt's front door and called 911, EMS found other suicide notes scattered around house. Pt reports taking 10 2 mg Valium,10 0.5mg  Alprazolam and 1 glass of wine at about 1130 pm last night, upon [facility] arrival found pt asleep in bed and pt awakened easily per their report.
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
## 2011-03-09 NOTE — ED Notes (Signed)
Pt denies SI/HI at present, suicide notes written yesterday and pills taken last night, pt currently awake and oriented, reports being exhausted and stated "I have been fighting insomnia for 20 years and I just wanted to put myself to sleep last night in a big way".
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
## 2011-03-09 NOTE — BHH Counselor (Signed)
Attempted to call [name] as requested but no response.  ACT left message to simply call ED and that we were returning her call.  No confidential or identifying information was left on the machine.    Ms Gabriela [name] responded.  Discussion will be elaborated on within assessment.

## 2011-03-09 NOTE — ED Notes (Signed)
Gave patient a sandwich and a cup of water
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
## 2011-03-10 ENCOUNTER — Encounter (HOSPITAL_COMMUNITY): Payer: Self-pay | Admitting: Psychiatry

## 2011-03-10 DIAGNOSIS — F329 Major depressive disorder, single episode, unspecified: Secondary | ICD-10-CM

## 2011-03-10 DIAGNOSIS — F339 Major depressive disorder, recurrent, unspecified: Secondary | ICD-10-CM

## 2011-03-10 MED ORDER — CHLORPROMAZINE HCL 25 MG PO TABS: 25.0000 mg | ORAL_TABLET | Freq: Three times a day (TID) | ORAL | Status: DC

## 2011-03-10 MED ORDER — GABAPENTIN 600 MG PO TABS
600.0000 mg | ORAL_TABLET | Freq: Every day | ORAL | Status: DC
  Administered 2011-03-10 – 2011-03-11 (×3): 600 mg via ORAL
  Filled 2011-03-10: qty 1
  Filled 2011-03-10: qty 2
  Filled 2011-03-10 (×2): qty 1
  Filled 2011-03-10: qty 2
  Filled 2011-03-10: qty 1

## 2011-03-10 MED ORDER — CHLORPROMAZINE HCL 10 MG PO TABS
10.0000 mg | ORAL_TABLET | Freq: Three times a day (TID) | ORAL | Status: DC
  Administered 2011-03-10 – 2011-03-11 (×2): 10 mg via ORAL
  Filled 2011-03-10 (×7): qty 1

## 2011-03-10 MED ORDER — TRAZODONE HCL 50 MG PO TABS
50.0000 mg | ORAL_TABLET | Freq: Every day | ORAL | Status: DC
  Administered 2011-03-10 – 2011-03-11 (×2): 50 mg via ORAL
  Filled 2011-03-10 (×3): qty 1

## 2011-03-10 MED ORDER — MUSCLE RUB 10-15 % EX CREA
TOPICAL_CREAM | CUTANEOUS | Status: DC | PRN
  Administered 2011-03-10 – 2011-03-12 (×2): via TOPICAL
  Filled 2011-03-10: qty 85

## 2011-03-10 MED ORDER — TROLAMINE SALICYLATE 10 % EX CREA
TOPICAL_CREAM | CUTANEOUS | Status: DC | PRN
  Filled 2011-03-10: qty 85

## 2011-03-10 NOTE — Tx Team (Signed)
Interdisciplinary Treatment Plan Update (Adult)  Date:  03/10/2011  Time Reviewed:  10:26 AM   Progress in Treatment: Attending groups:   Yes   Participating in groups:  Yes Taking medication as prescribed:  Yes Tolerating medication:  Yes Family/Significant othe contact made: No support system Patient understands diagnosis:  Yes Discussing patient identified problems/goals with staff: Yes Medical problems stabilized or resolved: Yes Denies suicidal/homicidal ideation:Yes Issues/concerns per patient self-inventory:  Other:  New problem(s) identified:  Reason for Continuation of Hospitalization: Depression Medication stabilization Anxiety  Interventions implemented related to continuation of hospitalization:  Medication Management; safety checks q 15 mins  Additional comments:  Estimated length of stay: 1-3 days  Discharge Plan:  Home with outpatient follow up  New goal(s):  Review of initial/current patient goals per problem list:   1.  Goal(s):  Eliminate SI  Met:  Yes  Target date:  d/c  As evidenced by: Lache is denies SI  2.  Goal (s):  Reduce Depression  Met:  Yes  Target date: d/c  As evidenced by:  Depression currently rated at eight/nine -rated at ten on admission  3.  Goal(s):  Stabilize on medications  Met:  No  Target date:  D/c  As evidenced by:  Cornie will report medications are working - less symptomatic  4.  Goal(s):  Refer for outpatient follow up  Met:  No  Target date: d/c  As evidenced by:  Follow up appointment to be in place  Attendees: Patient:     Family:     Physician:  Orson Aloe, MD 03/10/2011 10:26 AM   Nursing:    03/10/2011 10:26 AM   CaseManager:  Juline Patch, LCSW 03/10/2011 10:26 AM   Counselor:  Angus Palms, LCSW 03/10/2011 10:26 AM   Other:  Consuello Bossier, NP 03/10/2011 10:26 AM   Other:  Reyes Ivan, LCSWA 03/10/2011  10:26 AM   Other:     Other:      Scribe for Treatment Team:   Wynn Banker, LCSW,  03/10/2011 10:26 AM

## 2011-03-10 NOTE — [facility] Suicide Risk Assessment (Signed)
Suicide Risk Assessment  Admission Assessment     Demographic factors:  Assessment Details Time of Assessment: Admission Information Obtained From: Patient Current Mental Status:  Current Mental Status:  (pt denies all statements) Loss Factors:  Loss Factors: Loss of significant relationship;Financial problems / change in socioeconomic status Historical Factors:  Historical Factors: Family history of mental illness or substance abuse Risk Reduction Factors:  Risk Reduction Factors: Sense of responsibility to family;Employed  CLINICAL FACTORS:   Severe Anxiety and/or Agitation Depression:   Anhedonia Hopelessness Alcohol/Substance Abuse/Dependencies Previous Psychiatric Diagnoses and Treatments Medical Diagnoses and Treatments/Surgeries  COGNITIVE FEATURES THAT CONTRIBUTE TO RISK:  Closed-mindedness Thought constriction (tunnel vision)    SUICIDE RISK:   Moderate:  Frequent suicidal ideation with limited intensity, and duration, some specificity in terms of plans, no associated intent, good self-control, limited dysphoria/symptomatology, some risk factors present, and identifiable protective factors, including available and accessible social support.  Reason for hospitalization: .Suicidal thoughts after learning that she might lose her house that she remodeled and worked very hard on to make it aesthetically pleasing to her.  Diagnosis:  Axis I: Major Depression, single episode, Post Traumatic Stress Disorder and Substance Induced Mood Disorder  ADL's:  Intact  Sleep: Fair  Appetite:  Fair  Suicidal Ideation:  Had thoughts but sees very few options for herself. Homicidal Ideation:  Denies adamantly any homicidal thoughts.  Mental Status Examination/Evaluation: Objective:  Appearance: Casual  Eye Contact::  Good  Speech:  Clear and Coherent  Volume:  Normal  Mood:  Euthymic  Affect:  Congruent  Thought Process:  Coherent  Orientation:  Full  Thought Content:  WDL    Suicidal Thoughts:  Yes.  without intent/plan  Homicidal Thoughts:  No  Memory:  Immediate;   Good  Judgement:  Fair  Insight:  Shallow  Psychomotor Activity:  Normal  Concentration:  Fair  Recall:  Good  Akathisia:  No  AIMS (if indicated):     Assets:  Communication Skills Desire for Improvement Housing Intimacy Physical Health Talents/Skills Transportation Vocational/Educational  Sleep:  Number of Hours: 5     Vital Signs: Blood pressure 131/77, pulse 90, temperature 99.1 F (37.3 C), temperature source Oral, resp. rate 20, height 5\' 5"  (1.651 m), weight 76.658 kg (169 lb), last menstrual period 03/09/2011, SpO2 98.00%. Current Medications:  Current Facility-Administered Medications  Medication Dose Route Frequency Provider Last Rate Last Dose  . acetaminophen (TYLENOL) tablet 650 mg  650 mg Oral Q6H PRN Nehemiah Settle, MD      . alum & mag hydroxide-simeth (MAALOX/MYLANTA) 200-200-20 MG/5ML suspension 30 mL  30 mL Oral Q4H PRN Nehemiah Settle, MD      . chlordiazePOXIDE (LIBRIUM) capsule 25 mg  25 mg Oral BID PRN Nehemiah Settle, MD   25 mg at 03/10/11 0016  . chlorproMAZINE (THORAZINE) tablet 10 mg  10 mg Oral TID Orson Aloe, MD   10 mg at 03/11/11 0825  . gabapentin (NEURONTIN) tablet 600 mg  600 mg Oral QHS Nehemiah Settle, MD   600 mg at 03/10/11 2213  . magnesium hydroxide (MILK OF MAGNESIA) suspension 30 mL  30 mL Oral Daily PRN Nehemiah Settle, MD      . Muscle Rub CREA   Topical PRN Orson Aloe, MD      . traZODone (DESYREL) tablet 50 mg  50 mg Oral QHS Orson Aloe, MD   50 mg at 03/10/11 2213  . DISCONTD: chlorproMAZINE (THORAZINE) tablet 25 mg  25 mg  Oral TID Orson Aloe, MD      . DISCONTD: trolamine salicylate (ASPERCREME) 10 % cream   Topical PRN Orson Aloe, MD        Lab Results:  Results for orders placed during the hospital encounter of 03/09/11 (from the past 48 hour(s))  URINALYSIS, ROUTINE W  REFLEX MICROSCOPIC     Status: Normal   Collection Time   03/09/11 10:23 AM      Component Value Range Comment   Color, Urine YELLOW  YELLOW     APPearance CLEAR  CLEAR     Specific Gravity, Urine 1.011  1.005 - 1.030     pH 6.5  5.0 - 8.0     Glucose, UA NEGATIVE  NEGATIVE (mg/dL)    Hgb urine dipstick NEGATIVE  NEGATIVE     Bilirubin Urine NEGATIVE  NEGATIVE     Ketones, ur NEGATIVE  NEGATIVE (mg/dL)    Protein, ur NEGATIVE  NEGATIVE (mg/dL)    Urobilinogen, UA 0.2  0.0 - 1.0 (mg/dL)    Nitrite NEGATIVE  NEGATIVE     Leukocytes, UA NEGATIVE  NEGATIVE  MICROSCOPIC NOT DONE ON URINES WITH NEGATIVE PROTEIN, BLOOD, LEUKOCYTES, NITRITE, OR GLUCOSE <1000 mg/dL.  CBC     Status: Normal   Collection Time   03/09/11 10:23 AM      Component Value Range Comment   WBC 5.9  4.0 - 10.5 (K/uL)    RBC 4.65  3.87 - 5.11 (MIL/uL)    Hemoglobin 14.5  12.0 - 15.0 (g/dL)    HCT 16.1  09.6 - 04.5 (%)    MCV 91.0  78.0 - 100.0 (fL)    MCH 31.2  26.0 - 34.0 (pg)    MCHC 34.3  30.0 - 36.0 (g/dL)    RDW 40.9  81.1 - 91.4 (%)    Platelets 276  150 - 400 (K/uL)   DIFFERENTIAL     Status: Normal   Collection Time   03/09/11 10:23 AM      Component Value Range Comment   Neutrophils Relative 55  43 - 77 (%)    Neutro Abs 3.3  1.7 - 7.7 (K/uL)    Lymphocytes Relative 30  12 - 46 (%)    Lymphs Abs 1.8  0.7 - 4.0 (K/uL)    Monocytes Relative 10  3 - 12 (%)    Monocytes Absolute 0.6  0.1 - 1.0 (K/uL)    Eosinophils Relative 4  0 - 5 (%)    Eosinophils Absolute 0.3  0.0 - 0.7 (K/uL)    Basophils Relative 1  0 - 1 (%)    Basophils Absolute 0.0  0.0 - 0.1 (K/uL)   COMPREHENSIVE METABOLIC PANEL     Status: Abnormal   Collection Time   03/09/11 10:23 AM      Component Value Range Comment   Sodium 137  135 - 145 (mEq/L)    Potassium 4.0  3.5 - 5.1 (mEq/L)    Chloride 103  96 - 112 (mEq/L)    CO2 28  19 - 32 (mEq/L)    Glucose, Bld 77  70 - 99 (mg/dL)    BUN 14  6 - 23 (mg/dL)    Creatinine, Ser 7.82  0.50  - 1.10 (mg/dL)    Calcium 95.6 (*) 8.4 - 10.5 (mg/dL)    Total Protein 7.7  6.0 - 8.3 (g/dL)    Albumin 4.3  3.5 - 5.2 (g/dL)    AST 22  0 - 37 (  U/L)    ALT 20  0 - 35 (U/L)    Alkaline Phosphatase 56  39 - 117 (U/L)    Total Bilirubin 0.7  0.3 - 1.2 (mg/dL)    GFR calc non Af Amer >90  >90 (mL/min)    GFR calc Af Amer >90  >90 (mL/min)   ETHANOL     Status: Normal   Collection Time   03/09/11 10:23 AM      Component Value Range Comment   Alcohol, Ethyl (B) <11  0 - 11 (mg/dL)   ACETAMINOPHEN LEVEL     Status: Normal   Collection Time   03/09/11 10:23 AM      Component Value Range Comment   Acetaminophen (Tylenol), Serum <15.0  10 - 30 (ug/mL)   URINE RAPID DRUG SCREEN (HOSP PERFORMED)     Status: Abnormal   Collection Time   03/09/11 10:23 AM      Component Value Range Comment   Opiates NONE DETECTED  NONE DETECTED     Cocaine NONE DETECTED  NONE DETECTED     Benzodiazepines POSITIVE (*) NONE DETECTED     Amphetamines NONE DETECTED  NONE DETECTED     Tetrahydrocannabinol NONE DETECTED  NONE DETECTED     Barbiturates NONE DETECTED  NONE DETECTED    SALICYLATE LEVEL     Status: Abnormal   Collection Time   03/09/11 10:23 AM      Component Value Range Comment   Salicylate Lvl <2.0 (*) 2.8 - 20.0 (mg/dL)    Physical Findings: AIMS:   CIWA:     COWS:      Treatment Plan Summary: Daily contact with patient to assess and evaluate symptoms and progress in treatment Medication management  Risk of harm to self is elevated by her chronic use of benzos for sleep, but she has some coping skills and chose to come into the hospital instead of trying to harm herself.  Risk of harm to others is minimal in that she has not been involved in any fights.  Plan: We will admit the patient for crisis stabilization and treatment. I talked to pt about starting Thorazine for anxiety and Trazodone for insomnia. She is very reluctant to take medications for her mood despite having taken Xanax for  10 years for insomnia.  I explained the risks and benefits of medication in detail.  We will continue on q. 15 checks the unit protocol. At this time there is no clinical indication for one-to-one observation as patient contract for safety and presents little risk to harm themself and others.  We will increase collateral information. I encourage patient to participate in group milieu therapy. Pt will be seen in treatment team meeting soon for further treatment and appropriate discharge planning. Please see history and physical note for more detailed information ELOS: 3 to 5 days.   Gabriela [name] 03/10/2011, 7:44 PM

## 2011-03-10 NOTE — Progress Notes (Signed)
[facility] Group Notes: (Counselor/Nursing/MHT/Case Management/Adjunct) 03/10/2011   @  11:00am   Type of Therapy:  Group Therapy  Participation Level:  Limited  Participation Quality:  Attentive, Sharing  Affect:  Appropriate  Cognitive:  Appropriate  Insight:  Limited  Engagement in Group: Limited  Engagement in Therapy:  Limited  Modes of Intervention:  Support and Exploration  Summary of Progress/Problems: Gabriela [name] was mostly quiet but spoke up at times about relationship issues. She stated that she had made a discovery of infidelity that changed her idea of what wellness looks like for her.   (facilitated by Charlyne Mom, doctoral psych intern)  [name] 03/10/2011 4:10 PM
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
## 2011-03-10 NOTE — Progress Notes (Signed)
Pt at nurses' station after shower around 2400. She is asking for her bedtime meds and is quite anxious when told they were not continued. Pt fears she will not be able to sleep, is tearful, anxious. Asking for benadryl if valium is not an option. MD paged, orders received to increase neurontin to 600mg  nightly. No other orders.  Also offered pt librium per orders and med teaching completed. Pt appreciative stating, "That's good. I will be missing my red wine that I have at night." On reassessment pt is asleep. Denies SI/HI and remains safe. Lawrence Marseilles
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
## 2011-03-10 NOTE — Progress Notes (Signed)
[facility] Group Notes: (Counselor/Nursing/MHT/Case Management/Adjunct) 03/10/2011   @1 :15pm  Type of Therapy:  Group Therapy  Participation Level:  Attentive  Participation Quality:  Attentive, Supportive  Affect:  Blunted  Cognitive:  Appropriate  Insight:  Limited  Engagement in Group: Limited  Engagement in Therapy:    Modes of Intervention:  Support and Exploration  Summary of Progress/Problems: Gabriela [name]  was attentive but not engaged in group process    [name] 03/10/2011 4:09 PM

## 2011-03-11 DIAGNOSIS — F411 Generalized anxiety disorder: Secondary | ICD-10-CM

## 2011-03-11 LAB — VITAMIN D 25 HYDROXY (VIT D DEFICIENCY, FRACTURES): Vit D, 25-Hydroxy: 38 ng/mL (ref 30–89)

## 2011-03-11 MED ORDER — DOCUSATE SODIUM 100 MG PO CAPS
100.0000 mg | ORAL_CAPSULE | Freq: Every day | ORAL | Status: DC
  Administered 2011-03-11: 100 mg via ORAL
  Filled 2011-03-11 (×4): qty 1

## 2011-03-11 MED ORDER — MAGNESIUM CITRATE PO SOLN
1.0000 | Freq: Once | ORAL | Status: AC
  Administered 2011-03-11: 1 via ORAL

## 2011-03-11 MED ORDER — MAGNESIUM CITRATE PO SOLN: 1.0000 | Freq: Once | ORAL | Status: DC

## 2011-03-11 MED ORDER — CHLORPROMAZINE HCL 10 MG PO TABS: 5.0000 mg | ORAL_TABLET | Freq: Three times a day (TID) | ORAL | Status: DC | PRN

## 2011-03-11 NOTE — Progress Notes (Signed)
Patient ID: Gabriela [name], female   DOB: 1955/05/06, 56 y.o.   MRN: [id number] Pt seen in consult room.  She describes that the Thorazine made her feel drugged up and askes if she could reduce the dose to only 5 mg.  Will order that that way for her and change to PRN as she agrees to, giving her more power over the dosing.  She looks brighter tonight and not as hopeless and discouraged as she did last night.  She has a smile on her face and seems to have some contentment.  She still describes totally confusion about her entire world crumbling at her feet.  She was challenged to consider what she could do if she could build her life from this moment.  She started thinking that she could take up for herself as no one else seems to be offering to do that.  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  She said that like a rug her life is on the dance floor. I challenged her to consider that in some cultures rugs are not passive and have a reputation of getting up and flying anywhere they want to go.  She said that she got the analogy of needing making her life what she wanted it to be and not fitting it around the lives of others.  She was reminded of the movie Abritrage where power is the best alibi and how her boss uses his power to be his alibi to do whatever he wants.  And the movie Under the Lelon Mast about how a divorced woman can put her life together any way she wants to.  Movies seemed to speak to her.  Pt then asked when she might be ready to leave.  I turned that back on to her and asked when did she think she would be ready.  She asked if she could leave tonight and I asked her to really focus on how she can put her life back together and discuss that with the case manager during the 0830 meeting in the AM.  She thanked me for the encouraging words.

## 2011-03-11 NOTE — Progress Notes (Signed)
Patient ID: Gabriela [name], female   DOB: 22-Jun-1955, 56 y.o.   MRN: [id number] Patient polite and appropriate this evening. Denies SI/HI/AV. Patient was concerned this evening because of possible interaction between trazodone and neurontin after reading information given to patient about trazodone. Reassured patient that many people on the unit receive these medications together at bedtime but expressed to patient to let staff know if 'things don't feel right.' Patient has been resting quietly in room. Patient given muscle rub this evening for pain in left thigh, states that medication was helpful.
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
## 2011-03-11 NOTE — Progress Notes (Signed)
[facility] Group Notes: (Counselor/Nursing/MHT/Case Management/Adjunct)  03/11/2011 @1 :15pm  Type of Therapy: Group Therapy   Participation Level: Active   Participation Quality: Attentive, Appropriate, Sharing   Affect: Appropriate   Cognitive: Appropriate   Insight: Good   Engagement in Group: Good   Engagement in Therapy: Limited   Modes of Intervention: Support and Exploration   Summary of Progress/Problems: Gabriela [name] participated in mindful deep breathing and progressive muscle relaxation exercises. She stated that the exercises were helpful and relaxing to her, but was called out of group and therefore unable to process her experience fully.   [name] 03/11/2011  3:48 PM
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
## 2011-03-11 NOTE — Progress Notes (Signed)
Patient seen during d/c planning group and individually.  Patient reports that she agreed to taking medication and that she is now feeling foggy and unclear.  She denies SI/HI.  She rates depression and anxiety at eight/nine today due to effects of the medications.  Patient requested that employer be contacted to notify of hospitalization.  Writer spoke with supervisor who advised patient can bring a letter with her at time of her return to work.  Options for outpatient follow up discussed.  Patient agreeable to referral to [facility] for outpatient services due to financial concerns.

## 2011-03-11 NOTE — Progress Notes (Signed)
[facility] Group Notes: (Counselor/Nursing/MHT/Case Management/Adjunct) 03/11/2011   @  11:00am   Type of Therapy:  Group Therapy  Participation Level:  Active  Participation Quality:  Attentive, Appropriate, Sharing  Affect:  Appropriate  Cognitive:  Appropriate  Insight:  Limited  Engagement in Group: Good  Engagement in Therapy:  Good  Modes of Intervention:  Support and Exploration  Summary of Progress/Problems: Gabriela [name] reflected on how much of her time and self she has given to holding onto unhelpful thoughts/feelings/events. She stated that 75% of her time and energy has been spent hiding her sadness and "ugliness" that she does not want others too see by cultivating a cheerful and stable appearance. Gabriela [name] received feedback from others that since she is able to pull off that appearance so well, she must have many of those qualities hidden beneath her sadness. They encouraged her to let go of some of the appearance and work through the sadness.   [name] 03/11/2011 2:35 PM

## 2011-03-11 NOTE — Progress Notes (Signed)
Pt c/o constipation. Received order for mag citrate and pt is to take dose at 1800. Pt c/o feeling "foggy headed" with thorazine. Sent note for MD. Pt refused 1500 medication dose and plans to discuss medication adjustments with MD. Memory Argue support, encouragement and 15 minute checks. Safety maintained on unit.

## 2011-03-12 DIAGNOSIS — F339 Major depressive disorder, recurrent, unspecified: Principal | ICD-10-CM

## 2011-03-12 MED ORDER — BIOTIN 2.5 MG PO TABS: 1.0000 | ORAL_TABLET | Freq: Every day | ORAL | Status: DC

## 2011-03-12 MED ORDER — VITAMIN D 1000 UNITS PO TABS: 1000.0000 [IU] | ORAL_TABLET | Freq: Every day | ORAL | Status: DC

## 2011-03-12 MED ORDER — EPINEPHRINE 0.3 MG/0.3ML IJ DEVI: 0.3000 mg | INTRAMUSCULAR | Status: AC | PRN

## 2011-03-12 MED ORDER — VITAMIN B 12 100 MCG PO LOZG: 100.0000 ug | LOZENGE | Freq: Every day | ORAL | Status: DC

## 2011-03-12 MED ORDER — VITAMIN C 500 MG PO TABS: 500.0000 mg | ORAL_TABLET | Freq: Every day | ORAL | Status: DC

## 2011-03-12 MED ORDER — GABAPENTIN 300 MG PO CAPS: 600.0000 mg | ORAL_CAPSULE | Freq: Every day | ORAL | Status: DC

## 2011-03-12 MED ORDER — FEXOFENADINE HCL 180 MG PO TABS: 180.0000 mg | ORAL_TABLET | Freq: Every day | ORAL | Status: DC

## 2011-03-12 MED ORDER — TRAZODONE HCL 50 MG PO TABS: 50.0000 mg | ORAL_TABLET | Freq: Every day | ORAL | Status: AC

## 2011-03-12 MED ORDER — CHLORPROMAZINE HCL 10 MG PO TABS: 5.0000 mg | ORAL_TABLET | Freq: Three times a day (TID) | ORAL | Status: AC | PRN

## 2011-03-12 MED ORDER — DSS 100 MG PO CAPS: 100.0000 mg | ORAL_CAPSULE | Freq: Every day | ORAL | Status: AC

## 2011-03-12 MED ORDER — ADULT MULTIVITAMIN W/MINERALS CH: 1.0000 | ORAL_TABLET | Freq: Every day | ORAL | Status: DC

## 2011-03-12 NOTE — Progress Notes (Signed)
Pt D/C home. Pt rx and follow-up visits were discussed and pt verbalized understanding. Pt denies SI/HI. Pt belongings were returned.  

## 2011-03-12 NOTE — Progress Notes (Signed)
[facility] Case Management Discharge Plan:  Will you be returning to the same living situation after discharge: Yes,  Laveta will be returning to her home at discahr[place]. At discharge, do you have transportation home?:Yes,  Patient will arrange transportation home. Do you have the ability to pay for your medications: No.  Demitra will be assisted with indigent medications Interagency Information:     Release of information consent forms completed and in the chart;  Patient's signature needed at discharge.  Patient to Follow up at:  Follow-up Information    Follow up with Dr. Garald Balding on 03/25/2011. (Your appointment with Dr. Duncan Dull is scheduled for Tuesday, March 25, 2011 at 1:00 pm.  Please arrive by 12:30)    Contact information:   [address] [place], [place]  [address]  [phone]      [facility] with [facility].   Contact information:   [address] [place], [place]  [address]  [phone]         Patient denies SI/HI:   Yes,  Sanaz denies SI    Safety Planning and Suicide Prevention discussed:  Yes,  Reviewed on 03/11/11  Barrier to discharge identified:Yes,  Limited support system  Summary and Recommendations:   Wynn Banker 03/12/2011, 9:33 AM
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
## 2011-03-12 NOTE — [facility] Suicide Risk Assessment (Signed)
Suicide Risk Assessment  Discharge Assessment     Demographic factors:  Assessment Details Time of Assessment: Admission Information Obtained From: Patient Current Mental Status:  Current Mental Status:  (pt denies all statements) Risk Reduction Factors:  Risk Reduction Factors: Sense of responsibility to family;Employed  CLINICAL FACTORS:   Severe Anxiety and/or Agitation Depression:   Anhedonia Comorbid alcohol abuse/dependence Previous Psychiatric Diagnoses and Treatments Medical Diagnoses and Treatments/Surgeries  COGNITIVE FEATURES THAT CONTRIBUTE TO RISK:  Thought constriction (tunnel vision)    SUICIDE RISK:   Minimal: No identifiable suicidal ideation.  Patients presenting with no risk factors but with morbid ruminations; may be classified as minimal risk based on the severity of the depressive symptoms  Diagnosis:  Axis I: Anxiety Disorder NOS and Mood Disorder NOS  ADL's:  Intact  Sleep: Fair  Appetite:  Good  Suicidal Ideation:  Denies adamantly any suicidal thoughts. Homicidal Ideation:  Denies adamantly any homicidal thoughts.  Mental Status Examination/Evaluation: Objective:  Appearance: Casual  Eye Contact::  Good  Speech:  Clear and Coherent  Volume:  Normal  Mood:  Euthymic  Affect:  Congruent  Thought Process:  Coherent  Orientation:  Full  Thought Content:  WDL  Suicidal Thoughts:  No  Homicidal Thoughts:  No  Memory:  Immediate;   Good Recent;   Good Remote;   Good  Judgement:  Good  Insight:  Good  Psychomotor Activity:  Normal  Concentration:  Good  Recall:  Good  Akathisia:  No  Handed:  Right  AIMS (if indicated):     Assets:  Communication Skills Desire for Improvement Intimacy Leisure Time Physical Health Resilience Social Support Talents/Skills Transportation Vocational/Educational  Sleep:  Number of Hours: 6.25     Vital Signs: Blood pressure 91/58, pulse 79, temperature 98 F (36.7 C), temperature source Oral, resp.  rate 16, height 5\' 5"  (1.651 m), weight 76.658 kg (169 lb), last menstrual period 03/09/2011, SpO2 98.00%. Current Medications:  Current Facility-Administered Medications  Medication Dose Route Frequency Provider Last Rate Last Dose  . acetaminophen (TYLENOL) tablet 650 mg  650 mg Oral Q6H PRN Nehemiah Settle, MD      . alum & mag hydroxide-simeth (MAALOX/MYLANTA) 200-200-20 MG/5ML suspension 30 mL  30 mL Oral Q4H PRN Nehemiah Settle, MD      . chlordiazePOXIDE (LIBRIUM) capsule 25 mg  25 mg Oral BID PRN Nehemiah Settle, MD   25 mg at 03/10/11 0016  . chlorproMAZINE (THORAZINE) tablet 5 mg  5 mg Oral TID PRN Orson Aloe, MD      . docusate sodium (COLACE) capsule 100 mg  100 mg Oral Daily Sanjuana Kava, NP   100 mg at 03/11/11 1058  . gabapentin (NEURONTIN) tablet 600 mg  600 mg Oral QHS Nehemiah Settle, MD   600 mg at 03/11/11 2107  . magnesium citrate solution 1 Bottle  1 Bottle Oral Once Orson Aloe, MD   1 Bottle at 03/11/11 2101  . magnesium hydroxide (MILK OF MAGNESIA) suspension 30 mL  30 mL Oral Daily PRN Nehemiah Settle, MD      . Muscle Rub CREA   Topical PRN Orson Aloe, MD      . traZODone (DESYREL) tablet 50 mg  50 mg Oral QHS Orson Aloe, MD   50 mg at 03/11/11 2103  . DISCONTD: chlorproMAZINE (THORAZINE) tablet 10 mg  10 mg Oral TID Orson Aloe, MD   10 mg at 03/11/11 0825  . DISCONTD: magnesium citrate solution 1  Bottle  1 Bottle Oral Once Sanjuana Kava, NP        Lab Results:  Results for orders placed during the hospital encounter of 03/09/11 (from the past 48 hour(s))  VITAMIN D 25 HYDROXY     Status: Normal   Collection Time   03/11/11  6:42 AM      Component Value Range Comment   Vit D, 25-Hydroxy 38  30 - 89 (ng/mL)    Physical Findings: AIMS:   CIWA:     COWS:      Reports what she has learned from this hospital stay is that she had gone to a very dark place that she never wants to return to, with concrete  planning she can overcome the struggles that have scared her before, and she vows to never to let those things to overwhelm her in the future.  Risk of harm to self is elevated in that she as attempted once in the past, but she now vows to be the victor in her life through concrete planning  Risk of harm to others is minimal in that she is a consciously a nonviolent person.  Plan: Discharge home with follow Continue Medication List  As of 03/12/2011  1:29 PM   STOP taking these medications         ALPRAZolam 0.5 MG tablet      diazepam 2 MG tablet      HYDROcodone-ibuprofen 7.5-200 MG per tablet         TAKE these medications         Biotin 2.5 MG Tabs   Take 1 tablet (2.5 mg total) by mouth daily. For nutritional supplementation.      chlorproMAZINE 10 MG tablet   Commonly known as: THORAZINE   Take 0.5 tablets (5 mg total) by mouth 3 (three) times daily as needed (anxiety).      cholecalciferol 1000 UNITS tablet   Commonly known as: VITAMIN D   Take 1 tablet (1,000 Units total) by mouth daily. For vitamin D replacement and mood      DSS 100 MG Caps   Take 100 mg by mouth daily.      EPINEPHrine 0.3 mg/0.3 mL Devi   Commonly known as: EPI-PEN   Inject 0.3 mLs (0.3 mg total) into the muscle as needed (anaphylaxis).      fexofenadine 180 MG tablet   Commonly known as: ALLEGRA   Take 1 tablet (180 mg total) by mouth daily. For allergies      gabapentin 300 MG capsule   Commonly known as: NEURONTIN   Take 2 capsules (600 mg total) by mouth at bedtime.      mulitivitamin with minerals Tabs   Take 1 tablet by mouth daily. For nutritional supplementation      traZODone 50 MG tablet   Commonly known as: DESYREL   Take 1 tablet (50 mg total) by mouth at bedtime. For insomnia      Vitamin B 12 100 MCG Lozg   Take 100 mcg by mouth daily. For nutritional replacement      vitamin C 500 MG tablet   Commonly known as: ASCORBIC ACID   Take 1 tablet (500 mg total) by mouth  daily. For nutritional supplementation.           Ritesh Opara 03/12/2011, 1:27 PM
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
## 2011-03-12 NOTE — Progress Notes (Signed)
Interdisciplinary Treatment Plan Update (Adult)  Date:  03/12/2011  Time Reviewed:  10:19 AM   Progress in Treatment: Attending groups:   Yes   Participating in groups:  Yes Taking medication as prescribed:  Yes Tolerating medication:  Yes Family/Significant othe contact made: No family involvement Patient understands diagnosis:  Yes Discussing patient identified problems/goals with staff: Yes Medical problems stabilized or resolved: Yes Denies suicidal/homicidal ideation:Yes Issues/concerns per patient self-inventory:  Other:  New problem(s) identified:  Reason for Continuation of Hospitalization: I Additional comments:  Estimated length of stay:  Discharge home today  Discharge Plan:  Home with outpatient follow up  New goal(s):  Review of initial/current patient goals per problem list:    1.  Goal(s):  Eliminate SI  Met:  Yes  Target date: d/c  As evidenced by: Gabriela [name] is no longer endorsing SI  2.  Goal (s):  Reduce/Eliminate depression   Met:  Yes  Target date: d/c  As evidenced by:  Gabriela [name] currently rates depression at zero; rated at ten on admission   3.  Goal(s):  .stabilize on meds   Met:  Yes  Target date:  d/c  As evidenced by:  Patient reports being stable on meds - less symptomatic  4.  Goal(s):  Refer for outpatient follow up  Met:  Yes  Target date: d/c  As evidenced by: Follow up appointments scheduled with [facility]  Attendees: Patient:     Family:     Physician:  Orson Aloe, MD 03/12/2011 10:19 AM   Nursing:    03/12/2011 10:19 AM   CaseManager:  Juline Patch, LCSW 03/12/2011 10:19 AM   Counselor:  Angus Palms, LCSW 03/12/2011 10:19 AM   Other:  Consuello Bossier, NP 03/12/2011 10:19 AM   Other:  Reyes Ivan, LCSWA 03/12/2011  10:19 AM  Other:  Annetta Maw, MSW Intern 03/12/2011  10:20 AM  Other:  Charlyne Mom,  PHD Student 03/12/2011  10:21 AM    Scribe for Treatment Team:   Wynn Banker, LCSW,  03/12/2011 10:19 AM

## 2011-03-13 NOTE — Progress Notes (Signed)
Patient Discharge Instructions:  No available consents for [facility] or [facility].  Wandra Scot, 03/13/2011, 5:54 PM
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
## 2011-05-01 ENCOUNTER — Other Ambulatory Visit: Payer: Self-pay | Admitting: Sports Medicine

## 2011-06-30 ENCOUNTER — Ambulatory Visit (INDEPENDENT_AMBULATORY_CARE_PROVIDER_SITE_OTHER): Payer: BC Managed Care – PPO | Admitting: Sports Medicine

## 2011-06-30 VITALS — BP 128/78

## 2011-06-30 DIAGNOSIS — R109 Unspecified abdominal pain: Secondary | ICD-10-CM

## 2011-06-30 DIAGNOSIS — R1030 Lower abdominal pain, unspecified: Secondary | ICD-10-CM | POA: Insufficient documentation

## 2011-06-30 NOTE — Progress Notes (Signed)
  Subjective:    Patient ID: Precious Gilding, female    DOB: Nov 17, 1955, 56 y.o.   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  Since then she has developed hamstring pain also.  The pain is worse with stretching.  There is no radiating pain down the legs.  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

## 2011-06-30 NOTE — Assessment & Plan Note (Addendum)
She can continue her activity as tolerated using a thigh sleeve.  Hip HEP.    Reck after 2 mos pending response  Keep up meds as before  They are helping her radicular sxs

## 2011-08-11 ENCOUNTER — Other Ambulatory Visit: Payer: Self-pay | Admitting: Sports Medicine

## 2011-09-04 ENCOUNTER — Other Ambulatory Visit: Payer: Self-pay | Admitting: *Deleted

## 2011-09-04 ENCOUNTER — Other Ambulatory Visit: Payer: Self-pay | Admitting: Sports Medicine

## 2011-09-04 MED ORDER — HYDROCODONE-IBUPROFEN 7.5-200 MG PO TABS: 1.0000 | ORAL_TABLET | Freq: Four times a day (QID) | ORAL | Status: AC | PRN

## 2011-10-06 ENCOUNTER — Ambulatory Visit (INDEPENDENT_AMBULATORY_CARE_PROVIDER_SITE_OTHER): Payer: BC Managed Care – PPO | Admitting: Sports Medicine

## 2011-10-06 ENCOUNTER — Encounter: Payer: Self-pay | Admitting: Sports Medicine

## 2011-10-06 VITALS — BP 122/84 | HR 80 | Ht 66.0 in | Wt 164.0 lb

## 2011-10-06 DIAGNOSIS — M5412 Radiculopathy, cervical region: Secondary | ICD-10-CM

## 2011-10-06 MED ORDER — CYCLOBENZAPRINE HCL 5 MG PO TABS: ORAL_TABLET | ORAL | Status: DC

## 2011-10-06 NOTE — Progress Notes (Signed)
  Subjective:    Patient ID: Gabriela [name], female    DOB: [birth year], 56 y.o.   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

## 2011-10-06 NOTE — Assessment & Plan Note (Signed)
No change in regular meds  Add low dose flexeril at night and that has helped  givenmotion exercise for neck  Shakeout exercises for trap  Reck this p 2 mos

## 2011-12-05 ENCOUNTER — Ambulatory Visit (INDEPENDENT_AMBULATORY_CARE_PROVIDER_SITE_OTHER): Payer: BC Managed Care – PPO | Admitting: Family Medicine

## 2011-12-05 ENCOUNTER — Encounter: Payer: Self-pay | Admitting: Family Medicine

## 2011-12-05 VITALS — BP 145/87 | Ht 66.0 in | Wt 162.0 lb

## 2011-12-05 DIAGNOSIS — M67919 Unspecified disorder of synovium and tendon, unspecified shoulder: Secondary | ICD-10-CM

## 2011-12-05 DIAGNOSIS — M7540 Impingement syndrome of unspecified shoulder: Secondary | ICD-10-CM

## 2011-12-05 DIAGNOSIS — M75100 Unspecified rotator cuff tear or rupture of unspecified shoulder, not specified as traumatic: Secondary | ICD-10-CM

## 2011-12-05 DIAGNOSIS — M7520 Bicipital tendinitis, unspecified shoulder: Secondary | ICD-10-CM

## 2011-12-05 DIAGNOSIS — M7550 Bursitis of unspecified shoulder: Secondary | ICD-10-CM

## 2011-12-05 DIAGNOSIS — M5412 Radiculopathy, cervical region: Secondary | ICD-10-CM

## 2011-12-05 DIAGNOSIS — M542 Cervicalgia: Secondary | ICD-10-CM | POA: Insufficient documentation

## 2011-12-05 NOTE — Patient Instructions (Signed)
Very nice to meet you. I think most of the pain is coming from your shoulder. It is good to see that this injection did help. Please remember this will likely be temporary. We're giving you some exercises to do on her own. We will send in a referral to physical therapy for your shoulder. Take ibuprofen 800 mg 3 times a day for 5 days. Then you can use it as needed thereafter. Come back and see me again in 3-4 weeks.

## 2011-12-05 NOTE — Assessment & Plan Note (Signed)
Patient does have MRI findings that could be contributing to her troubles. At this point though I would say that this is not progressing and actually her acute pain secondary to her shoulder impingement syndrome as well as bicep tendinitis.  Patient though will go to physical therapy and work on range of motion exercises to try to help as well as some strengthening exercises of the neck and upper back that could be most helpful. Patient given home exercise program as well today. Patient will continue the current medications that she is taking on a regular basis. Patient did not have OMT today. Do not know if this will be beneficial or not. If this is a herniated disc HVLA is contraindicated. We'll continue to monitor and have patient followup in 4-6 weeks for reevaluation.

## 2011-12-05 NOTE — Assessment & Plan Note (Signed)
Patient today physical exam was more consistent with rotator cuff syndrome as well as bicep tendinitis then actually with her cervical radiculopathy. Patient did have a diagnostic lidocaine/Marcaine injection done today which did show significant improvement in pain. This makes me feel that the pain was likely more of an overuse injury secondary to her job and repetitive motion. Patient given home exercises as well as Thera-Band to try. Patient will also go to formal physical therapy if she has time and her busy schedule. Patient also will do a burst of anti-inflammatories. She will use over the counter ibuprofen. Patient will followup again in 3-4 weeks for further evaluation. If she continues to have pain I would consider doing a ultrasound of her right shoulder to see what other pathology could be contributing. If normal then this could be still be cervical radiculopathy and they will need consider further imaging such as repeat MRI if we feel like this is progressing.

## 2011-12-05 NOTE — Progress Notes (Signed)
Subjective: Patient is a very pleasant 56 year old female with a past medical history significant for cervical radiculopathy for the last 10 years. Patient has been keeping it under control with certain activities but has started a new job which seems to be exacerbating it. Patient states that the pain seems to be radiating to her shoulder and is worse with certain activities such as moving her arm across her chest and raising it above her head. 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 This is a repetitive motion that she does from 4-8 hours a regular basis. Patient states that this pain is somewhat similar to the radicular symptoms she's had previously but a little bit different. Patient has tried some anti-inflammatories as needed and some range of motion exercises with no improvement. Patient is also taking Neurontin at night and a little bit of Flexeril which has been somewhat helpful. Patient was sent here for consultation for potential osteopathic manipulation.   Previous MRI was reviewed today. Patient had one back in the year 2002 that did show C5-C6 right-sided disc protrusion that could be given a radicular symptoms and nerve impingement.  Review of systems: 14 system review is done and unremarkable to the orthopedic problem.  Physical exam Blood pressure 145/87, height 5\' 6"  (1.676 m), weight 162 lb (73.483 kg), last menstrual period 03/09/2011. General: No apparent distress the patient is moderately anxious with some pressured speech Respiratory: Patient is in full sentences and does not appear short of breath Neck: + spurling's on right with C5-6 distribution Full neck range of motion Grip strength and sensation normal in bilateral hands Strength good C4 to T1 distribution No sensory change to C4 to T1at baseline Reflexes normal Shoulder:right Inspection reveals no abnormalities, atrophy or asymmetry. Palpation is normal with no tenderness over AC joint but  moderate TTP over bicipital groove. ROM is full in all planes. Rotator cuff strength normal throughout. + impingement with negative Neer and Hawkin's tests, empty can. Speeds and Yergason's tests POSITIVE No labral pathology noted with negative Obrien's, negative clunk and good stability. Normal scapular function observed. No painful arc and no drop arm sign. No apprehension sign Neuro: Cranial nerves II through XII intact and 2+ deep tendon reflexes in all extremities.  Procedure note After verbal and written consent given pt was prepped with betadine.  3:3 mixture of lidocaine 1% and Marcaine used in right subacromial space.  Pt minimal bleeding dressed with band aid, Pt given red flags to look for pt had better pain control immediatly.

## 2012-01-06 ENCOUNTER — Ambulatory Visit: Payer: BC Managed Care – PPO | Admitting: Family Medicine

## 2012-02-09 ENCOUNTER — Other Ambulatory Visit: Payer: Self-pay | Admitting: Sports Medicine

## 2012-05-03 ENCOUNTER — Other Ambulatory Visit: Payer: Self-pay | Admitting: Sports Medicine

## 2012-05-25 ENCOUNTER — Other Ambulatory Visit: Payer: Self-pay | Admitting: Sports Medicine

## 2012-07-16 ENCOUNTER — Other Ambulatory Visit: Payer: Self-pay | Admitting: Sports Medicine

## 2012-07-16 NOTE — Telephone Encounter (Signed)
Refilled per dr. Darrick Penna.

## 2012-09-14 ENCOUNTER — Ambulatory Visit (INDEPENDENT_AMBULATORY_CARE_PROVIDER_SITE_OTHER): Payer: BC Managed Care – PPO | Admitting: Sports Medicine

## 2012-09-14 VITALS — BP 134/80 | Ht 66.0 in | Wt 154.0 lb

## 2012-09-14 DIAGNOSIS — S2341XA Sprain of ribs, initial encounter: Secondary | ICD-10-CM

## 2012-09-14 NOTE — Assessment & Plan Note (Signed)
Given the acute onset of this with precise mechanism low suspicion for any type of cardiac involvement.  Normal respirations and breath sounds on exam. Recommended for patient to obtain a tight fitting compression shirt were brought help minimize and provide some pressure to the area. Recommended to focus on thoracic extension exercises including flow roller and towel roller.  She is to avoid lifting while bent over for the next 2-3 weeks. Discuss this may take up to 6 weeks to totally heal but that she should build return to activity as long as she is limiting her pain to less than 3-5/10. She has Voltaren Gel that she has not been using but has available and she has been recommended to try this over the area that is sore. She is to continue to ice daily. She is to followup in 4-6 weeks if not having significant improvement in can consider further imaging at that time and potential referral for physical therapy.

## 2012-09-14 NOTE — Progress Notes (Signed)
  [facility]  Patient name: Gabriela [name] MRN [id number]  Date of birth: Aug 24, 1955  CC & HPI:  Gabriela [name] is a 57 y.o. female who is returning to care.   Chief Complaint  Patient presents with  . Chest Injury - sternum Description:  patient reports that she was leaning resting her anterior chest and attempting to lift a heavy object.  When she did that she felt a pop and immediate pain in the lower aspect of her middle chest.  She reported exquisite pain for approximately 30 seconds and had spontaneous improvement following deep inhalation that produced and audible pop.  She reports since that time she has been using ice on her regular basis , taking ibuprofen.  She has been continuing to have pain but currently rates her pain 5+/5. She has not been able to reproduce her initial symptoms but does have tenderness to palpation over the middle of her sternum and with bilateral shoulder internal rotation while trying to hold objects.    Onset/Duration:  yesterday   Illicit ing Event/Injury:  Yes on 09/13/2012   RED FLAGS & ROS:  patient denies any continued numbness or tingling, denies dropping any objects, denies any difficulty with breathing, it does still have some minimal pain with deep inhalation       ROS:  See HPI  Pertinent History Reviewed:  Medical & Surgical Hx:  Reviewed: Significant for major depressive disorder, rotator cuff impingement, chronic right-sided neck pain, chronic cervical radiculopathy Medications: Reviewed & Updated - see associated section, noted for Vicoprofen Social History: Reviewed -  reports that she has never smoked. She has never used smokeless tobacco. 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 In no discomfort; no respiratory distress   PSYCH:  alert and appropriate, good insight   Heart and lungs  RRR, S1/S2 heard, no murmur CTA B, no wheezes, no crackles  NeuroMSK  chest Exam: Appearance:  there no skin lesions, there is no gross deformity  Palpation:  there's a reducible pain with midline palpation of the sternum especially over the right parasternal border at the costochondral junction of ribs 6 through 10. Sternoclavicular joints are nontender without deformity, clavicles are nontender without deformity   ROM: Active Passive  Able to take normal inspiration and expiration      Neurovascular:   upper extremity myotomes 5+/5  MSK Testing:  normal strength with bilateral shoulder internal rotation that a neutral position without pain when testing individually, however when tested together does reproduce patient's pain.  Not able to reproduce pain with external rotation of either shoulder         Assessment & Plan:  See problem associated charting

## 2012-09-14 NOTE — Patient Instructions (Addendum)
It was nice to meet you.  Please get a compression shirt or bra to help with you pain. Avoid lifting while bent over. Avoid doing activities that cause pain greater than 3-5 out of 10. It is okay to use the Voltaren gel you have. If you are continuing to have pain follow up in 6 weeks

## 2012-10-04 ENCOUNTER — Encounter: Payer: Self-pay | Admitting: Internal Medicine

## 2012-11-17 ENCOUNTER — Ambulatory Visit (AMBULATORY_SURGERY_CENTER): Payer: Self-pay | Admitting: *Deleted

## 2012-11-17 VITALS — Ht 66.5 in | Wt 155.0 lb

## 2012-11-17 DIAGNOSIS — Z1211 Encounter for screening for malignant neoplasm of colon: Secondary | ICD-10-CM

## 2012-11-17 MED ORDER — PEG-KCL-NACL-NASULF-NA ASC-C 100 G PO SOLR: ORAL | Status: DC

## 2012-11-17 NOTE — Progress Notes (Signed)
No egg or soy allergy 

## 2012-11-18 ENCOUNTER — Encounter: Payer: Self-pay | Admitting: Internal Medicine

## 2012-11-29 ENCOUNTER — Other Ambulatory Visit: Payer: Self-pay | Admitting: Sports Medicine

## 2012-12-02 ENCOUNTER — Encounter: Payer: Self-pay | Admitting: Internal Medicine

## 2012-12-02 ENCOUNTER — Ambulatory Visit (AMBULATORY_SURGERY_CENTER): Payer: BC Managed Care – PPO | Admitting: Internal Medicine

## 2012-12-02 VITALS — BP 113/72 | HR 51 | Temp 97.0°F | Resp 22 | Ht 66.5 in | Wt 155.0 lb

## 2012-12-02 DIAGNOSIS — D126 Benign neoplasm of colon, unspecified: Secondary | ICD-10-CM

## 2012-12-02 DIAGNOSIS — Z1211 Encounter for screening for malignant neoplasm of colon: Secondary | ICD-10-CM

## 2012-12-02 MED ORDER — SODIUM CHLORIDE 0.9 % IV SOLN: 500.0000 mL | INTRAVENOUS | Status: DC

## 2012-12-02 NOTE — Progress Notes (Signed)
Bufford Spikes, RN helped the pt to dress.  No complaints noted in the recovery room.  The pt was very happy with the way her procedure went today and the care she received. Maw

## 2012-12-02 NOTE — Progress Notes (Signed)
Called to room to assist during endoscopic procedure.  Patient ID and intended procedure confirmed with present staff. Received instructions for my participation in the procedure from the performing physician.  

## 2012-12-02 NOTE — Patient Instructions (Signed)
YOU HAD AN ENDOSCOPIC PROCEDURE TODAY AT THE [facility]: Refer to the procedure report that was given to you for any specific questions about what was found during the examination.  If the procedure report does not answer your questions, please call your gastroenterologist to clarify.  If you requested that your care partner not be given the details of your procedure findings, then the procedure report has been included in a sealed envelope for you to review at your convenience later.  YOU SHOULD EXPECT: Some feelings of bloating in the abdomen. Passage of more gas than usual.  Walking can help get rid of the air that was put into your GI tract during the procedure and reduce the bloating. If you had a lower endoscopy (such as a colonoscopy or flexible sigmoidoscopy) you may notice spotting of blood in your stool or on the toilet paper. If you underwent a bowel prep for your procedure, then you may not have a normal bowel movement for a few days.  DIET: Your first meal following the procedure should be a light meal and then it is ok to progress to your normal diet.  A half-sandwich or bowl of soup is an example of a good first meal.  Heavy or fried foods are harder to digest and may make you feel nauseous or bloated.  Likewise meals heavy in dairy and vegetables can cause extra gas to form and this can also increase the bloating.  Drink plenty of fluids but you should avoid alcoholic beverages for 24 hours.  ACTIVITY: Your care partner should take you home directly after the procedure.  You should plan to take it easy, moving slowly for the rest of the day.  You can resume normal activity the day after the procedure however you should NOT DRIVE or use heavy machinery for 24 hours (because of the sedation medicines used during the test).    SYMPTOMS TO REPORT IMMEDIATELY: A gastroenterologist can be reached at any hour.  During normal business hours, 8:30 AM to 5:00 PM Monday through Friday,  call [phone].  After hours and on weekends, please call the GI answering service at [phone] who will take a message and have the physician on call contact you.   Following lower endoscopy (colonoscopy or flexible sigmoidoscopy):  Excessive amounts of blood in the stool  Significant tenderness or worsening of abdominal pains  Swelling of the abdomen that is new, acute  Fever of 100F or higher    FOLLOW UP: If any biopsies were taken you will be contacted by phone or by letter within the next 1-3 weeks.  Call your gastroenterologist if you have not heard about the biopsies in 3 weeks.  Our staff will call the home number listed on your records the next business day following your procedure to check on you and address any questions or concerns that you may have at that time regarding the information given to you following your procedure. This is a courtesy call and so if there is no answer at the home number and we have not heard from you through the emergency physician on call, we will assume that you have returned to your regular daily activities without incident.  SIGNATURES/CONFIDENTIALITY: You and/or your care partner have signed paperwork which will be entered into your electronic medical record.  These signatures attest to the fact that that the information above on your After Visit Summary has been reviewed and is understood.  Full responsibility of the confidentiality   of this discharge information lies with you and/or your care-partner.   Handout was sealed in envelope for polyps. You may resume your current medications today. Please call if any questions or concerns.

## 2012-12-02 NOTE — Op Note (Signed)
[facility] [address] [place], [address]   COLONOSCOPY PROCEDURE REPORT  PATIENT: Gabriela [name], Gabriela [name]  MR#: [id number] BIRTHDATE: Aug 09, 1955 , 57  yrs. old GENDER: Female ENDOSCOPIST: Roxy Cedar, MD REFERRED XB:[id number] Steffanie Dunn, P.A. PROCEDURE DATE:  12/02/2012 PROCEDURE:   Colonoscopy with snare polypectomyx 1 First Screening Colonoscopy - Avg.  risk and is 50 yrs.  old or older Yes.  Prior Negative Screening - Now for repeat screening. N/A  History of Adenoma - Now for follow-up colonoscopy & has been > or = to 3 yrs.  N/A  Polyps Removed Today? Yes. ASA CLASS:   Class II INDICATIONS:average risk screening. MEDICATIONS: MAC sedation, administered by CRNA and propofol (Diprivan) 350mg  IV  DESCRIPTION OF PROCEDURE:   After the risks benefits and alternatives of the procedure were thoroughly explained, informed consent was obtained.  A digital rectal exam revealed no abnormalities of the rectum.   The LB [id number]  endoscope was introduced through the anus and advanced to the cecum, which was identified by both the appendix and ileocecal valve. No adverse events experienced.   The quality of the prep was excellent, using MoviPrep  The instrument was then slowly withdrawn as the colon was fully examined.    COLON FINDINGS: A sessile polyp measuring 6 mm in size was found in the ascending colon.  A polypectomy was performed with a cold snare.  The resection was complete and the polyp tissue was completely retrieved.   The colon was otherwise normal.  There was no diverticulosis, inflammation, other polyps or cancers . Retroflexed views revealed internal hemorrhoids. The time to cecum=4 minutes 15 seconds.  Withdrawal time=12 minutes 08 seconds. The scope was withdrawn and the procedure completed. COMPLICATIONS: There were no complications.  ENDOSCOPIC IMPRESSION: 1.   Sessile polyp measuring 6 mm in size was found in the ascending colon;  polypectomy was performed with a cold snare 2.   The colon was otherwise normal  RECOMMENDATIONS: 1. Repeat colonoscopy in 5 years if polyp adenomatous; otherwise 10 years   eSigned:  Roxy Cedar, MD 12/02/2012 10:53 AM   cc: Quentin Mulling, PA-C and The Patient

## 2012-12-02 NOTE — Progress Notes (Signed)
Pt has a driving service with her today.  I went over discharge instructions with the pt and she signed the form.  I answered questions for the pt and explained to her everything was written on the discharge instructions and to read them today and to call if any questions or concerns. Maw

## 2012-12-03 ENCOUNTER — Telehealth: Payer: Self-pay | Admitting: *Deleted

## 2012-12-03 NOTE — Telephone Encounter (Signed)
286 Follow up Call-  Call back number 12/02/2012  Post procedure Call Back phone  # [phone]  Permission to leave phone message Yes     Patient questions:  Do you have a fever, pain , or abdominal swelling? no Pain Score  0 *  Have you tolerated food without any problems? yes  Have you been able to return to your normal activities? yes  Do you have any questions about your discharge instructions: Diet   no Medications  no Follow up visit  no  Do you have questions or concerns about your Care? no  Actions: * If pain score is 4 or above: No action needed, pain <4.

## 2012-12-08 ENCOUNTER — Encounter: Payer: Self-pay | Admitting: Internal Medicine

## 2013-01-10 ENCOUNTER — Other Ambulatory Visit: Payer: Self-pay | Admitting: *Deleted

## 2013-01-10 MED ORDER — HYDROCODONE-IBUPROFEN 7.5-200 MG PO TABS: 1.0000 | ORAL_TABLET | ORAL | Status: DC | PRN

## 2013-01-12 ENCOUNTER — Telehealth: Payer: Self-pay | Admitting: *Deleted

## 2013-01-12 ENCOUNTER — Other Ambulatory Visit: Payer: Self-pay | Admitting: Physician Assistant

## 2013-01-12 MED ORDER — DIAZEPAM 2 MG PO TABS: ORAL_TABLET | ORAL | Status: DC

## 2013-01-12 NOTE — Telephone Encounter (Signed)
RX Diazepam called in

## 2013-01-12 NOTE — Telephone Encounter (Signed)
REFILL = Needs a call about her Diazepam says pharmacy has not received the refill auth. Please contact pt

## 2013-01-20 ENCOUNTER — Other Ambulatory Visit: Payer: Self-pay | Admitting: Sports Medicine

## 2013-02-18 ENCOUNTER — Other Ambulatory Visit: Payer: Self-pay | Admitting: Physician Assistant

## 2013-02-18 NOTE — Telephone Encounter (Signed)
Called refill in to pharmacy    

## 2013-02-21 ENCOUNTER — Ambulatory Visit (INDEPENDENT_AMBULATORY_CARE_PROVIDER_SITE_OTHER): Payer: BC Managed Care – PPO | Admitting: Sports Medicine

## 2013-02-21 ENCOUNTER — Encounter: Payer: Self-pay | Admitting: Sports Medicine

## 2013-02-21 VITALS — BP 125/87 | HR 87 | Ht 66.5 in | Wt 162.0 lb

## 2013-02-21 DIAGNOSIS — M7710 Lateral epicondylitis, unspecified elbow: Secondary | ICD-10-CM

## 2013-02-21 DIAGNOSIS — M7712 Lateral epicondylitis, left elbow: Secondary | ICD-10-CM

## 2013-02-21 MED ORDER — IBUPROFEN 600 MG PO TABS: ORAL_TABLET | ORAL | Status: DC

## 2013-02-21 NOTE — Progress Notes (Signed)
   Subjective:    Patient ID: Gabriela [name], female    DOB: 04-Mar-1955, 58 y.o.   MRN: [id number]  HPI chief complaint: Left elbow pain  Very pleasant right-hand-dominant female comes in today complaining of lateral left elbow pain for the past 3 weeks. No specific injury that she can recall but gradual onset of pain that is most noticeable with repetitive activity. She describes an aching discomfort with occasional radiating pain into her forearm. She works for the [employer] and notices that repetitive activity such as what she has to do at work causes her discomfort. No numbness and tingling. She has been icing and using over-the-counter Advil with some relief in symptoms. Also takes an occasional Vicoprofen if the pain is severe. She denies any significant problems with this elbow in the past. She is getting similar symptoms in the right elbow as well but not as severe. No prior elbow surgeries. She has not noticed any swelling.  Her medical history is reviewed Medications are reviewed She is allergic to prednisone     Review of Systems     Objective:   Physical Exam Well-developed, well-nourished. No acute distress. Sitting comfortable in exam room.  Left elbow: Full range of motion. There is pain with full extension. No effusion. No soft tissue swelling. There is tenderness to palpation directly over the lateral epicondyle and reproducible pain with ECRB testing. No tenderness to palpation along the medial epicondyle. Slightly decreased grip strength secondary to pain. Good radial and ulnar pulses.  MSK ultrasound of the lateral left elbow was performed. There is an area of hypoechogenicity at the insertion of the common extensor tendon onto the lateral epicondyle. Findings are consistent with edema in this area suggesting lateral epicondylitis. Small spur off the lateral epicondyle is also seen. Underlying joint space appears unremarkable.       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 She will start an eccentric home exercise program. Luckily, she will be traveling to [facility] for the next 5 days and will be able to rest her elbow during this time. I've asked her to experiment with both counterforce bracing and a cockup wrist brace to see if she find either one to be beneficial. I've asked that she continue with icing as well. Followup with me in 3 weeks for clinical reevaluation and repeat ultrasound. We discussed the possibility of a cortisone injection at that time but we will need to clarify her allergy to prednisone before administering this.

## 2013-03-07 ENCOUNTER — Other Ambulatory Visit: Payer: Self-pay | Admitting: Sports Medicine

## 2013-03-15 ENCOUNTER — Ambulatory Visit (INDEPENDENT_AMBULATORY_CARE_PROVIDER_SITE_OTHER): Payer: BC Managed Care – PPO | Admitting: Sports Medicine

## 2013-03-15 ENCOUNTER — Encounter: Payer: Self-pay | Admitting: Sports Medicine

## 2013-03-15 VITALS — BP 131/87 | HR 83 | Ht 66.5 in | Wt 162.0 lb

## 2013-03-15 DIAGNOSIS — S46019A Strain of muscle(s) and tendon(s) of the rotator cuff of unspecified shoulder, initial encounter: Secondary | ICD-10-CM

## 2013-03-15 DIAGNOSIS — M7710 Lateral epicondylitis, unspecified elbow: Secondary | ICD-10-CM

## 2013-03-15 DIAGNOSIS — S43429A Sprain of unspecified rotator cuff capsule, initial encounter: Secondary | ICD-10-CM

## 2013-03-15 MED ORDER — VITAMIN B 12 100 MCG PO LOZG: 100.0000 ug | LOZENGE | Freq: Every day | ORAL | Status: DC

## 2013-03-15 MED ORDER — MELOXICAM 15 MG PO TABS: 15.0000 mg | ORAL_TABLET | Freq: Every day | ORAL | Status: DC

## 2013-03-16 NOTE — Progress Notes (Signed)
   Subjective:    Patient ID: Gabriela [name], female    DOB: 28-Feb-1955, 58 y.o.   MRN: [id number]  HPI Patient comes in today for followup on left elbow lateral epicondylitis. Overall, she has noticed some improvement. She states that she is about 30% improved. This is despite being noncompliant with her home exercises. She has purchased a counterforce brace but has yet to use it. She has been taking ibuprofen which does seem to be helpful. She continues to localize her pain to the lateral elbow. It is most noticeable with wrist related activity such as picking up heavy objects. She is also complaining of some diffuse right shoulder pain. She twisted the right shoulder a couple of weeks ago when a suitcase rolled over. She has had problems with her rotator cuff in the past. Feels like her symptoms are similar. No numbness or tingling.    Review of Systems     Objective:   Physical Exam Well-developed, well-nourished. No acute distress. Awake alert and oriented x3. Vital signs are reviewed.  Left elbow: Full range of motion. No effusion. No soft tissue swelling. There is still tenderness to palpation directly over the lateral epicondyle but only mild pain with resisted ECRB testing. No tenderness to palpation over the medial epicondyle. Neurovascularly intact distally.  Right shoulder: Full range of motion. Slightly positive painful ARC. No tenderness over the clavicle or a.c. joint. No tenderness over the bicipital groove. Rotator cuff strength is 5/5 and slightly reproducible of pain with resisted supraspinatus. Neurovascularly intact distally.       Assessment & Plan:  1. Left elbow pain secondary to lateral epicondylitis 2. Right shoulder pain secondary to rotator cuff strain  For both her left elbow and her right shoulder I have sent her to physical therapy. I've asked her to try some iontophoresis for the left elbow. Patient is not interested in a cortisone injection. I would  like to change her Motrin to meloxicam 15 mg daily for 7 days then when necessary. I've encouraged her to try either a counterforce brace or a wrist brace while working. Followup with me again in 4 weeks.

## 2013-03-30 ENCOUNTER — Telehealth: Payer: Self-pay

## 2013-03-30 ENCOUNTER — Other Ambulatory Visit: Payer: Self-pay | Admitting: Physician Assistant

## 2013-03-30 NOTE — Telephone Encounter (Signed)
Per MS, pt is overdue for OV and needs to sched appt before Rx runs out. Pt will not be given any refills until she has OV. lmom to pt.

## 2013-04-17 ENCOUNTER — Other Ambulatory Visit: Payer: Self-pay | Admitting: Physician Assistant

## 2013-04-18 ENCOUNTER — Other Ambulatory Visit: Payer: Self-pay | Admitting: Sports Medicine

## 2013-04-18 ENCOUNTER — Other Ambulatory Visit: Payer: Self-pay | Admitting: Emergency Medicine

## 2013-04-18 MED ORDER — DIAZEPAM 2 MG PO TABS: 2.0000 mg | ORAL_TABLET | Freq: Every day | ORAL | Status: DC

## 2013-04-19 ENCOUNTER — Telehealth: Payer: Self-pay | Admitting: *Deleted

## 2013-04-19 ENCOUNTER — Other Ambulatory Visit: Payer: Self-pay | Admitting: *Deleted

## 2013-04-19 NOTE — Telephone Encounter (Signed)
Patient called requesting refill on valium Rx. Per Kelby Aline, PA-C orders, patient was told 03/30/13, at last refill that she needs to schedule follow up for additional refills to be authorized.  Patient states she called and spoke with front staff and informed them that she works 2 full time jobs and not able to schedule until she gets March work schedule.  Patient now requesting refill and per [facility] patient was advised she needs office visit and will only authorize #10 more until visit.  Patient became ery upset by that and wanted to speak with Vicie Mutters, PA-C, states she knows why patient needs Rx TID.  Estill Bamberg authorized patient to have #20 until 04/26/13 office visit with her and will discuss in detail at that visit.  Estill Bamberg saw patient 09/2012 and advised her at that time that she needs to decrease dose from TID to BID.  #20 called into [facility] [place] and [place]. Until office visit.

## 2013-04-25 DIAGNOSIS — F329 Major depressive disorder, single episode, unspecified: Secondary | ICD-10-CM | POA: Insufficient documentation

## 2013-04-25 DIAGNOSIS — F32A Depression, unspecified: Secondary | ICD-10-CM | POA: Insufficient documentation

## 2013-04-25 DIAGNOSIS — G4700 Insomnia, unspecified: Secondary | ICD-10-CM | POA: Insufficient documentation

## 2013-04-25 DIAGNOSIS — T7840XA Allergy, unspecified, initial encounter: Secondary | ICD-10-CM | POA: Insufficient documentation

## 2013-04-25 DIAGNOSIS — F419 Anxiety disorder, unspecified: Secondary | ICD-10-CM | POA: Insufficient documentation

## 2013-04-26 ENCOUNTER — Ambulatory Visit (INDEPENDENT_AMBULATORY_CARE_PROVIDER_SITE_OTHER): Payer: BC Managed Care – PPO | Admitting: Physician Assistant

## 2013-04-26 ENCOUNTER — Encounter: Payer: Self-pay | Admitting: Physician Assistant

## 2013-04-26 VITALS — BP 142/82 | HR 72 | Temp 98.1°F | Resp 16 | Ht 66.5 in | Wt 165.0 lb

## 2013-04-26 DIAGNOSIS — F329 Major depressive disorder, single episode, unspecified: Secondary | ICD-10-CM

## 2013-04-26 DIAGNOSIS — F411 Generalized anxiety disorder: Secondary | ICD-10-CM

## 2013-04-26 DIAGNOSIS — Z1331 Encounter for screening for depression: Secondary | ICD-10-CM

## 2013-04-26 DIAGNOSIS — F32A Depression, unspecified: Secondary | ICD-10-CM

## 2013-04-26 DIAGNOSIS — G4700 Insomnia, unspecified: Secondary | ICD-10-CM

## 2013-04-26 MED ORDER — BUPROPION HCL ER (XL) 150 MG PO TB24: 150.0000 mg | ORAL_TABLET | ORAL | Status: DC

## 2013-04-26 MED ORDER — DIAZEPAM 2 MG PO TABS: 2.0000 mg | ORAL_TABLET | Freq: Three times a day (TID) | ORAL | Status: DC | PRN
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
## 2013-04-26 NOTE — Patient Instructions (Signed)
Please start on Wellbutrin 150XL in the morning with food. This should not interfere with your sleep. Sometimes please can get nausea or "snappy" with it, are two most common side effects I hear, if this happens call and let me know. Take it in the morning but if you would like you can try around 10 AM the days you work later to see if this helps with energy.   If you do not tolerate this medication, please stop and call me. I will then call in Celexa 20mg . Start on 10mg  in the morning with food for one week, then you can go up to the 20 mg. Two most common AEs with this is nausea and headache, if this happens please stop it.   Insomnia Insomnia is frequent trouble falling and/or staying asleep. Insomnia can be a long term problem or a short term problem. Both are common. Insomnia can be a short term problem when the wakefulness is related to a certain stress or worry. Long term insomnia is often related to ongoing stress during waking hours and/or poor sleeping habits. Overtime, sleep deprivation itself can make the problem worse. Every little thing feels more severe because you are overtired and your ability to cope is decreased. CAUSES   Stress, anxiety, and depression.  Poor sleeping habits.  Distractions such as TV in the bedroom.  Naps close to bedtime.  Engaging in emotionally charged conversations before bed.  Technical reading before sleep.  Alcohol and other sedatives. They may make the problem worse. They can hurt normal sleep patterns and normal dream activity.  Stimulants such as caffeine for several hours prior to bedtime.  Pain syndromes and shortness of breath can cause insomnia.  Exercise late at night.  Changing time zones may cause sleeping problems (jet lag). It is sometimes helpful to have someone observe your sleeping patterns. They should look for periods of not breathing during the night (sleep apnea). They should also look to see how long those periods last. If  you live alone or observers are uncertain, you can also be observed at a sleep clinic where your sleep patterns will be professionally monitored. Sleep apnea requires a checkup and treatment. Give your caregivers your medical history. Give your caregivers observations your family has made about your sleep.  SYMPTOMS   Not feeling rested in the morning.  Anxiety and restlessness at bedtime.  Difficulty falling and staying asleep. TREATMENT   Your caregiver may prescribe treatment for an underlying medical disorders. Your caregiver can give advice or help if you are using alcohol or other drugs for self-medication. Treatment of underlying problems will usually eliminate insomnia problems.  Medications can be prescribed for short time use. They are generally not recommended for lengthy use.  Over-the-counter sleep medicines are not recommended for lengthy use. They can be habit forming.  You can promote easier sleeping by making lifestyle changes such as:  Using relaxation techniques that help with breathing and reduce muscle tension.  Exercising earlier in the day.  Changing your diet and the time of your last meal. No night time snacks.  Establish a regular time to go to bed.  Counseling can help with stressful problems and worry.  Soothing music and white noise may be helpful if there are background noises you cannot remove.  Stop tedious detailed work at least one hour before bedtime. HOME CARE INSTRUCTIONS   Keep a diary. Inform your caregiver about your progress. This includes any medication side effects. See your caregiver regularly.  Take note of:  Times when you are asleep.  Times when you are awake during the night.  The quality of your sleep.  How you feel the next day. This information will help your caregiver care for you.  Get out of bed if you are still awake after 15 minutes. Read or do some quiet activity. Keep the lights down. Wait until you feel sleepy and  go back to bed.  Keep regular sleeping and waking hours. Avoid naps.  Exercise regularly.  Avoid distractions at bedtime. Distractions include watching television or engaging in any intense or detailed activity like attempting to balance the household checkbook.  Develop a bedtime ritual. Keep a familiar routine of bathing, brushing your teeth, climbing into bed at the same time each night, listening to soothing music. Routines increase the success of falling to sleep faster.  Use relaxation techniques. This can be using breathing and muscle tension release routines. It can also include visualizing peaceful scenes. You can also help control troubling or intruding thoughts by keeping your mind occupied with boring or repetitive thoughts like the old concept of counting sheep. You can make it more creative like imagining planting one beautiful flower after another in your backyard garden.  During your day, work to eliminate stress. When this is not possible use some of the previous suggestions to help reduce the anxiety that accompanies stressful situations. MAKE SURE YOU:   Understand these instructions.  Will watch your condition.  Will get help right away if you are not doing well or get worse. Document Released: 01/25/2000 Document Revised: 04/21/2011 Document Reviewed: 02/24/2007 [facility] Patient Information 2014 [place].

## 2013-06-06 ENCOUNTER — Other Ambulatory Visit: Payer: Self-pay | Admitting: Physician Assistant

## 2013-07-15 ENCOUNTER — Other Ambulatory Visit: Payer: Self-pay | Admitting: Sports Medicine

## 2013-08-04 ENCOUNTER — Telehealth: Payer: Self-pay

## 2013-08-04 NOTE — Telephone Encounter (Signed)
Patient scheduled appointment tomorrow morning with Estill Bamberg.

## 2013-08-04 NOTE — Telephone Encounter (Signed)
Requesting to speak with you for personal reasons. Please call patient at earliest convenience.

## 2013-08-05 ENCOUNTER — Ambulatory Visit (INDEPENDENT_AMBULATORY_CARE_PROVIDER_SITE_OTHER): Payer: BC Managed Care – PPO | Admitting: Physician Assistant

## 2013-08-05 ENCOUNTER — Encounter: Payer: Self-pay | Admitting: Physician Assistant

## 2013-08-05 ENCOUNTER — Encounter (INDEPENDENT_AMBULATORY_CARE_PROVIDER_SITE_OTHER): Payer: Self-pay

## 2013-08-05 VITALS — BP 128/78 | HR 76 | Temp 98.0°F | Resp 16 | Wt 162.0 lb

## 2013-08-05 DIAGNOSIS — G4700 Insomnia, unspecified: Secondary | ICD-10-CM

## 2013-08-05 DIAGNOSIS — F411 Generalized anxiety disorder: Secondary | ICD-10-CM

## 2013-08-05 MED ORDER — SULFAMETHOXAZOLE-TMP DS 800-160 MG PO TABS
1.0000 | ORAL_TABLET | Freq: Two times a day (BID) | ORAL | Status: DC
Start: 2013-08-05 — End: 2013-10-19

## 2013-08-05 MED ORDER — DIAZEPAM 5 MG PO TABS: 5.0000 mg | ORAL_TABLET | Freq: Three times a day (TID) | ORAL | Status: DC | PRN
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
## 2013-08-05 NOTE — Patient Instructions (Signed)
Stress and Stress Management Stress is a normal reaction to life events. It is what you feel when life demands more than you are used to or more than you can handle. Some stress can be useful. For example, the stress reaction can help you catch the last bus of the day, study for a test, or meet a deadline at work. But stress that occurs too often or for too long can cause problems. It can affect your emotional health and interfere with relationships and normal daily activities. Too much stress can weaken your immune system and increase your risk for physical illness. If you already have a medical problem, stress can make it worse. CAUSES  All sorts of life events may cause stress. An event that causes stress for one person may not be stressful for another person. Major life events commonly cause stress. These may be positive or negative. Examples include losing your job, moving into a new home, getting married, having a baby, or losing a loved one. Less obvious life events may also cause stress, especially if they occur day after day or in combination. Examples include working long hours, driving in traffic, caring for children, being in debt, or being in a difficult relationship. SIGNS AND SYMPTOMS Stress may cause emotional symptoms including, the following:  Anxiety--This is feeling worried, afraid, on edge, overwhelmed, or out of control.  Anger--This is feeling irritated or impatient.  Depression--This is feeling sad, down, helpless, or guilty.  Difficulty focusing, remembering, or making decisions. Stress may cause physical symptoms, including the following:   Aches and pains--These may affect your head, neck, back, stomach, or other areas of your body.  Tight muscles or clenched jaw.  Low energy or trouble sleeping. Stress may cause unhealthy behaviors, including the following:   Eating to feel better (overeating) or skipping meals.  Sleeping too little, too much, or both.  Working  too much or putting off tasks (procrastination).  Smoking, drinking alcohol, or using drugs to feel better. DIAGNOSIS  Stress is diagnosed through an assessment by your health care Ishan Sanroman. Your health care Detrick Dani will ask questions about your symptoms and any stressful life events.Your health care Dannie Woolen will also ask about your medical history and may order blood tests or other tests. Certain medical conditions and medicine can cause physical symptoms similar to stress. Mental illness can cause emotional symptoms and unhealthy behaviors similar to stress. Your health care Azazel Franze may refer you to a mental health professional for further evaluation.  TREATMENT  Stress management is the recommended treatment for stress.The goals of stress management are reducing stressful life events and coping with stress in healthy ways.  Techniques for reducing stressful life events include the following:  Stress identification--Self-monitor for stress and identify what causes stress for you. These skills may help you to avoid some stressful events.  Time management--Set your priorities, keep a calendar of events, and learn to say "no." These tools can help you avoid making too many commitments. Techniques for coping with stress include the following:  Rethinking the problem--Try to think realistically about stressful events rather than ignoring them or overreacting. Try to find the positives in a stressful situation rather than focusing on the negatives.  Exercise--Physical exercise can release both physical and emotional tension. The key is to find a form of exercise you enjoy and do it regularly.  Relaxation techniques. These relax the body and mind. Examples include yoga, meditation, tai chi, biofeedback, deep breathing, progressive muscle relaxation, listening to music, being   out in nature, journaling, and other hobbies. Again, the key is to find one or more that you enjoy and can do  regularly.  Healthy lifestyle--Eat a balanced diet, get plenty of sleep, and do not smoke. Avoid using alcohol or drugs to relax.  Strong support network--Spend time with family, friends, or other people you enjoy being around.Express your feelings and talk things over with someone you trust. Counseling or talktherapy with a mental health professional may be helpful if you are having difficulty managing stress on your own. Medicine is typically not recommended for the treatment of stress.Talk to your health care Olive Motyka if you think you need medicine for symptoms of stress. HOME CARE INSTRUCTIONS:  Keep all follow up appointments with your health care Talina Pleitez.  Only take any prescribed medicines as directed by your health care Dorri Ozturk.  Talk to your health care Haze Antillon before starting any new prescription or over-the-counter medicines. SEEK MEDICAL CARE IF:  Your symptoms get worse or you start having new symptoms.  You feel overwhelmed by your problems and can no longer manage them on your own. SEEK IMMEDIATE MEDICAL CARE IF:  You feel like hurting yourself or someone else. Document Released: 07/23/2000 Document Revised: 02/01/2013 Document Reviewed: 09/21/2012 [facility] Patient Information 2015 [place], [place]. This information is not intended to replace advice given to you by your health care Jetson Pickrel. Make sure you discuss any questions you have with your health care Jaymi Tinner. Insomnia Insomnia is frequent trouble falling and/or staying asleep. Insomnia can be a long term problem or a short term problem. Both are common. Insomnia can be a short term problem when the wakefulness is related to a certain stress or worry. Long term insomnia is often related to ongoing stress during waking hours and/or poor sleeping habits. Overtime, sleep deprivation itself can make the problem worse. Every little thing feels more severe because you are overtired and your ability to cope is  decreased. CAUSES   Stress, anxiety, and depression.  Poor sleeping habits.  Distractions such as TV in the bedroom.  Naps close to bedtime.  Engaging in emotionally charged conversations before bed.  Technical reading before sleep.  Alcohol and other sedatives. They may make the problem worse. They can hurt normal sleep patterns and normal dream activity.  Stimulants such as caffeine for several hours prior to bedtime.  Pain syndromes and shortness of breath can cause insomnia.  Exercise late at night.  Changing time zones may cause sleeping problems (jet lag). It is sometimes helpful to have someone observe your sleeping patterns. They should look for periods of not breathing during the night (sleep apnea). They should also look to see how long those periods last. If you live alone or observers are uncertain, you can also be observed at a sleep clinic where your sleep patterns will be professionally monitored. Sleep apnea requires a checkup and treatment. Give your caregivers your medical history. Give your caregivers observations your family has made about your sleep.  SYMPTOMS   Not feeling rested in the morning.  Anxiety and restlessness at bedtime.  Difficulty falling and staying asleep. TREATMENT   Your caregiver may prescribe treatment for an underlying medical disorders. Your caregiver can give advice or help if you are using alcohol or other drugs for self-medication. Treatment of underlying problems will usually eliminate insomnia problems.  Medications can be prescribed for short time use. They are generally not recommended for lengthy use.  Over-the-counter sleep medicines are not recommended for lengthy use. They can  be habit forming.  You can promote easier sleeping by making lifestyle changes such as:  Using relaxation techniques that help with breathing and reduce muscle tension.  Exercising earlier in the day.  Changing your diet and the time of your  last meal. No night time snacks.  Establish a regular time to go to bed.  Counseling can help with stressful problems and worry.  Soothing music and white noise may be helpful if there are background noises you cannot remove.  Stop tedious detailed work at least one hour before bedtime. HOME CARE INSTRUCTIONS   Keep a diary. Inform your caregiver about your progress. This includes any medication side effects. See your caregiver regularly. Take note of:  Times when you are asleep.  Times when you are awake during the night.  The quality of your sleep.  How you feel the next day. This information will help your caregiver care for you.  Get out of bed if you are still awake after 15 minutes. Read or do some quiet activity. Keep the lights down. Wait until you feel sleepy and go back to bed.  Keep regular sleeping and waking hours. Avoid naps.  Exercise regularly.  Avoid distractions at bedtime. Distractions include watching television or engaging in any intense or detailed activity like attempting to balance the household checkbook.  Develop a bedtime ritual. Keep a familiar routine of bathing, brushing your teeth, climbing into bed at the same time each night, listening to soothing music. Routines increase the success of falling to sleep faster.  Use relaxation techniques. This can be using breathing and muscle tension release routines. It can also include visualizing peaceful scenes. You can also help control troubling or intruding thoughts by keeping your mind occupied with boring or repetitive thoughts like the old concept of counting sheep. You can make it more creative like imagining planting one beautiful flower after another in your backyard garden.  During your day, work to eliminate stress. When this is not possible use some of the previous suggestions to help reduce the anxiety that accompanies stressful situations. MAKE SURE YOU:   Understand these instructions.  Will  watch your condition.  Will get help right away if you are not doing well or get worse. Document Released: 01/25/2000 Document Revised: 04/21/2011 Document Reviewed: 02/24/2007 [facility] Patient Information 2015 [place], [place]. This information is not intended to replace advice given to you by your health care Amon Costilla. Make sure you discuss any questions you have with your health care Kayode Petion.

## 2013-08-11 ENCOUNTER — Other Ambulatory Visit: Payer: Self-pay | Admitting: Physician Assistant

## 2013-08-21 ENCOUNTER — Other Ambulatory Visit: Payer: Self-pay | Admitting: Sports Medicine

## 2013-08-30 ENCOUNTER — Other Ambulatory Visit: Payer: Self-pay | Admitting: *Deleted

## 2013-08-30 MED ORDER — HYDROCODONE-IBUPROFEN 7.5-200 MG PO TABS: 1.0000 | ORAL_TABLET | ORAL | Status: DC | PRN

## 2013-09-30 ENCOUNTER — Encounter: Payer: Self-pay | Admitting: Physician Assistant

## 2013-09-30 ENCOUNTER — Ambulatory Visit (INDEPENDENT_AMBULATORY_CARE_PROVIDER_SITE_OTHER): Payer: BC Managed Care – PPO | Admitting: Physician Assistant

## 2013-09-30 VITALS — BP 122/80 | HR 80 | Temp 97.9°F | Resp 16 | Wt 163.0 lb

## 2013-09-30 DIAGNOSIS — G4700 Insomnia, unspecified: Secondary | ICD-10-CM

## 2013-09-30 DIAGNOSIS — H0019 Chalazion unspecified eye, unspecified eyelid: Secondary | ICD-10-CM

## 2013-09-30 DIAGNOSIS — L710 Perioral dermatitis: Secondary | ICD-10-CM

## 2013-09-30 DIAGNOSIS — L719 Rosacea, unspecified: Secondary | ICD-10-CM

## 2013-09-30 DIAGNOSIS — H0011 Chalazion right upper eyelid: Secondary | ICD-10-CM

## 2013-09-30 MED ORDER — SUVOREXANT 20 MG PO TABS: 20.0000 | ORAL_TABLET | Freq: Every day | ORAL | Status: DC

## 2013-09-30 MED ORDER — ERYTHROMYCIN 2 % EX GEL: Freq: Every day | CUTANEOUS | Status: DC

## 2013-09-30 MED ORDER — SUVOREXANT 10 MG PO TABS: 10.0000 | ORAL_TABLET | Freq: Every day | ORAL | Status: DC

## 2013-09-30 MED ORDER — NEOMYCIN-POLYMYXIN-HC 3.5-10000-1 OP SUSP: 3.0000 [drp] | Freq: Four times a day (QID) | OPHTHALMIC | Status: DC

## 2013-09-30 MED ORDER — ACYCLOVIR 5 % EX OINT: TOPICAL_OINTMENT | CUTANEOUS | Status: DC

## 2013-09-30 NOTE — Progress Notes (Signed)
   Subjective:    Patient ID: Gabriela [name], female    DOB: 12-Aug-1955, 58 y.o.   MRN: [id number]  HPI 58 y.o. female under increasing stress due to family/job situations with a history of HSV1 presents with a puritis bump in the corner of her left eye that has gotten bigger.  She states that she has been having lip chapping/pain, and having a cold sore of her bottom lip. She continues to have some redness around her lips, especially around her bottom lip.   Continues to have insomnia while on valium, can not tolerate ambien.  Review of Systems  Constitutional: Negative.   HENT: Positive for facial swelling (lips). Negative for congestion, dental problem, drooling, ear discharge, ear pain, hearing loss, mouth sores, nosebleeds, postnasal drip, rhinorrhea, sinus pressure, sneezing, sore throat, tinnitus, trouble swallowing and voice change.   Eyes: Positive for itching. Negative for photophobia, pain, discharge, redness and visual disturbance.  Respiratory: Negative.   Cardiovascular: Negative.        Objective:   Physical Exam  Constitutional: She appears well-developed and well-nourished.  HENT:  Head: Normocephalic and atraumatic.  Right Ear: External ear normal.  Left Ear: External ear normal.  Nose: Nose normal.  Mouth/Throat: Uvula is midline, oropharynx is clear and moist and mucous membranes are normal.  Bilateral corners with some erythema/scaling, and perioral erythema/tenderness  Eyes: Conjunctivae and EOM are normal. Pupils are equal, round, and reactive to light. Right eye exhibits no discharge. Left eye exhibits no discharge.    Neck: Normal range of motion. Neck supple.  Cardiovascular: Normal rate and regular rhythm.   Pulmonary/Chest: Effort normal and breath sounds normal.  Lymphadenopathy:    She has no cervical adenopathy.      Assessment & Plan:  Chalazion versus hordeolum- try warm wet compresses and if it gets worse can do drops Perioral dermatitis-  erythromycin gel Insomnia- good sleep hygiene discussed, increase day time activity, try belsomra

## 2013-09-30 NOTE — Patient Instructions (Signed)
Insomnia Insomnia is frequent trouble falling and/or staying asleep. Insomnia can be a long term problem or a short term problem. Both are common. Insomnia can be a short term problem when the wakefulness is related to a certain stress or worry. Long term insomnia is often related to ongoing stress during waking hours and/or poor sleeping habits. Overtime, sleep deprivation itself can make the problem worse. Every little thing feels more severe because you are overtired and your ability to cope is decreased. CAUSES   Stress, anxiety, and depression.  Poor sleeping habits.  Distractions such as TV in the bedroom.  Naps close to bedtime.  Engaging in emotionally charged conversations before bed.  Technical reading before sleep.  Alcohol and other sedatives. They may make the problem worse. They can hurt normal sleep patterns and normal dream activity.  Stimulants such as caffeine for several hours prior to bedtime.  Pain syndromes and shortness of breath can cause insomnia.  Exercise late at night.  Changing time zones may cause sleeping problems (jet lag). It is sometimes helpful to have someone observe your sleeping patterns. They should look for periods of not breathing during the night (sleep apnea). They should also look to see how long those periods last. If you live alone or observers are uncertain, you can also be observed at a sleep clinic where your sleep patterns will be professionally monitored. Sleep apnea requires a checkup and treatment. Give your caregivers your medical history. Give your caregivers observations your family has made about your sleep.  SYMPTOMS   Not feeling rested in the morning.  Anxiety and restlessness at bedtime.  Difficulty falling and staying asleep. TREATMENT   Your caregiver may prescribe treatment for an underlying medical disorders. Your caregiver can give advice or help if you are using alcohol or other drugs for self-medication. Treatment  of underlying problems will usually eliminate insomnia problems.  Medications can be prescribed for short time use. They are generally not recommended for lengthy use.  Over-the-counter sleep medicines are not recommended for lengthy use. They can be habit forming.  You can promote easier sleeping by making lifestyle changes such as:  Using relaxation techniques that help with breathing and reduce muscle tension.  Exercising earlier in the day.  Changing your diet and the time of your last meal. No night time snacks.  Establish a regular time to go to bed.  Counseling can help with stressful problems and worry.  Soothing music and white noise may be helpful if there are background noises you cannot remove.  Stop tedious detailed work at least one hour before bedtime. HOME CARE INSTRUCTIONS   Keep a diary. Inform your caregiver about your progress. This includes any medication side effects. See your caregiver regularly. Take note of:  Times when you are asleep.  Times when you are awake during the night.  The quality of your sleep.  How you feel the next day. This information will help your caregiver care for you.  Get out of bed if you are still awake after 15 minutes. Read or do some quiet activity. Keep the lights down. Wait until you feel sleepy and go back to bed.  Keep regular sleeping and waking hours. Avoid naps.  Exercise regularly.  Avoid distractions at bedtime. Distractions include watching television or engaging in any intense or detailed activity like attempting to balance the household checkbook.  Develop a bedtime ritual. Keep a familiar routine of bathing, brushing your teeth, climbing into bed at the same   time each night, listening to soothing music. Routines increase the success of falling to sleep faster.  Use relaxation techniques. This can be using breathing and muscle tension release routines. It can also include visualizing peaceful scenes. You can  also help control troubling or intruding thoughts by keeping your mind occupied with boring or repetitive thoughts like the old concept of counting sheep. You can make it more creative like imagining planting one beautiful flower after another in your backyard garden.  During your day, work to eliminate stress. When this is not possible use some of the previous suggestions to help reduce the anxiety that accompanies stressful situations. MAKE SURE YOU:   Understand these instructions.  Will watch your condition.  Will get help right away if you are not doing well or get worse. Document Released: 01/25/2000 Document Revised: 04/21/2011 Document Reviewed: 02/24/2007 Exit[employer] Patient Information 2015 [employer]. This information is not intended to replace advice given to you by your health care provider. Make sure you discuss any questions you have with your health care provider.  

## 2013-10-03 ENCOUNTER — Encounter: Payer: Self-pay | Admitting: Physician Assistant

## 2013-10-13 ENCOUNTER — Other Ambulatory Visit: Payer: Self-pay | Admitting: Sports Medicine

## 2013-10-13 ENCOUNTER — Other Ambulatory Visit (INDEPENDENT_AMBULATORY_CARE_PROVIDER_SITE_OTHER): Payer: BC Managed Care – PPO

## 2013-10-13 DIAGNOSIS — Z0000 Encounter for general adult medical examination without abnormal findings: Secondary | ICD-10-CM

## 2013-10-13 LAB — CBC WITH DIFFERENTIAL/PLATELET
Basophils Absolute: 0 10*3/uL (ref 0.0–0.1)
Basophils Relative: 1 % (ref 0–1)
Eosinophils Absolute: 0.3 10*3/uL (ref 0.0–0.7)
Eosinophils Relative: 7 % — ABNORMAL HIGH (ref 0–5)
HCT: 39 % (ref 36.0–46.0)
Hemoglobin: 13.2 g/dL (ref 12.0–15.0)
LYMPHS ABS: 1.2 10*3/uL (ref 0.7–4.0)
Lymphocytes Relative: 30 % (ref 12–46)
MCH: 30.3 pg (ref 26.0–34.0)
MCHC: 33.8 g/dL (ref 30.0–36.0)
MCV: 89.4 fL (ref 78.0–100.0)
Monocytes Absolute: 0.4 10*3/uL (ref 0.1–1.0)
Monocytes Relative: 10 % (ref 3–12)
NEUTROS ABS: 2.1 10*3/uL (ref 1.7–7.7)
NEUTROS PCT: 52 % (ref 43–77)
PLATELETS: 266 10*3/uL (ref 150–400)
RBC: 4.36 MIL/uL (ref 3.87–5.11)
RDW: 13.2 % (ref 11.5–15.5)
WBC: 4 10*3/uL (ref 4.0–10.5)

## 2013-10-13 LAB — HEMOGLOBIN A1C
Hgb A1c MFr Bld: 5.8 % — ABNORMAL HIGH (ref <5.7)
Mean Plasma Glucose: 120 mg/dL — ABNORMAL HIGH (ref <117)

## 2013-10-14 LAB — BASIC METABOLIC PANEL WITH GFR
BUN: 17 mg/dL (ref 6–23)
CHLORIDE: 102 meq/L (ref 96–112)
CO2: 28 meq/L (ref 19–32)
Calcium: 10 mg/dL (ref 8.4–10.5)
Creat: 0.69 mg/dL (ref 0.50–1.10)
GFR, Est African American: >89 mL/min
Glucose, Bld: 99 mg/dL (ref 70–99)
POTASSIUM: 4.4 meq/L (ref 3.5–5.3)
SODIUM: 137 meq/L (ref 135–145)

## 2013-10-14 LAB — FERRITIN: FERRITIN: 192 ng/mL (ref 10–291)

## 2013-10-14 LAB — IRON AND TIBC
%SAT: 31 % (ref 20–55)
Iron: 99 ug/dL (ref 42–145)
TIBC: 315 ug/dL (ref 250–470)
UIBC: 216 ug/dL (ref 125–400)

## 2013-10-14 LAB — URINALYSIS, ROUTINE W REFLEX MICROSCOPIC
Bilirubin Urine: NEGATIVE
Glucose, UA: NEGATIVE mg/dL
Hgb urine dipstick: NEGATIVE
Ketones, ur: NEGATIVE mg/dL
LEUKOCYTES UA: NEGATIVE
NITRITE: NEGATIVE
PH: 7.5 (ref 5.0–8.0)
Protein, ur: NEGATIVE mg/dL
Urobilinogen, UA: 0.2 mg/dL (ref 0.0–1.0)

## 2013-10-14 LAB — HEPATIC FUNCTION PANEL
ALK PHOS: 48 U/L (ref 39–117)
ALT: 18 U/L (ref 0–35)
AST: 20 U/L (ref 0–37)
Albumin: 4.3 g/dL (ref 3.5–5.2)
BILIRUBIN DIRECT: 0.1 mg/dL (ref 0.0–0.3)
BILIRUBIN TOTAL: 0.4 mg/dL (ref 0.2–1.2)
Indirect Bilirubin: 0.3 mg/dL (ref 0.2–1.2)
Total Protein: 6.7 g/dL (ref 6.0–8.3)

## 2013-10-14 LAB — LIPID PANEL
CHOL/HDL RATIO: 2.8 ratio
Cholesterol: 196 mg/dL (ref 0–200)
HDL: 71 mg/dL (ref >39)
LDL Cholesterol: 111 mg/dL — ABNORMAL HIGH (ref 0–99)
Triglycerides: 71 mg/dL (ref <150)
VLDL: 14 mg/dL (ref 0–40)

## 2013-10-14 LAB — INSULIN, FASTING: Insulin fasting, serum: 3.9 u[IU]/mL (ref 2.0–19.6)

## 2013-10-14 LAB — MICROALBUMIN / CREATININE URINE RATIO
Creatinine, Urine: 18 mg/dL
MICROALB UR: 0.5 mg/dL (ref 0.00–1.89)
MICROALB/CREAT RATIO: 27.8 mg/g (ref 0.0–30.0)

## 2013-10-14 LAB — TSH: TSH: 1.156 u[IU]/mL (ref 0.350–4.500)

## 2013-10-14 LAB — MAGNESIUM: Magnesium: 2.1 mg/dL (ref 1.5–2.5)

## 2013-10-14 LAB — VITAMIN D 25 HYDROXY (VIT D DEFICIENCY, FRACTURES): VIT D 25 HYDROXY: 34 ng/mL (ref 30–89)

## 2013-10-14 LAB — VITAMIN B12: Vitamin B-12: 543 pg/mL (ref 211–911)

## 2013-10-19 ENCOUNTER — Encounter: Payer: Self-pay | Admitting: Physician Assistant

## 2013-10-19 ENCOUNTER — Ambulatory Visit (INDEPENDENT_AMBULATORY_CARE_PROVIDER_SITE_OTHER): Payer: BC Managed Care – PPO | Admitting: Physician Assistant

## 2013-10-19 VITALS — BP 120/68 | HR 68 | Temp 98.1°F | Resp 16 | Ht 66.5 in | Wt 165.0 lb

## 2013-10-19 DIAGNOSIS — Z0000 Encounter for general adult medical examination without abnormal findings: Secondary | ICD-10-CM

## 2013-10-19 DIAGNOSIS — M791 Myalgia, unspecified site: Secondary | ICD-10-CM

## 2013-10-19 DIAGNOSIS — IMO0001 Reserved for inherently not codable concepts without codable children: Secondary | ICD-10-CM

## 2013-10-19 LAB — RHEUMATOID FACTOR: Rhuematoid fact SerPl-aCnc: <10 IU/mL (ref <=14)

## 2013-10-19 LAB — CK: CK TOTAL: 181 U/L — AB (ref 7–177)

## 2013-10-19 LAB — SEDIMENTATION RATE: Sed Rate: 4 mm/hr (ref 0–22)

## 2013-10-19 NOTE — Patient Instructions (Signed)
Your AIC is in prediabetic range which is between 5.7 and 6.4, it was 5.8. This is a warning sign for diabetes. Your A1C is a measure of your sugar over the past 3 months and is not affected by what you have eaten over the past few days. Diabetes increases your chances of stroke and heart attack over 300 % and is the leading cause of blindness and kidney failure in the [place]. Please make sure you decrease bad carbs like white bread, white rice, potatoes, corn, soft drinks, pasta, cereals, refined sugars, sweet tea, dried fruits, alcohol, and fruit juice. Good carbs are okay to eat in moderation like sweet potatoes, brown rice, whole grain pasta/bread, most fruit (except dried fruit) and you can eat as many veggies as you want.    Your Vitamin D is not in range, it was 34, we want that around 60-80. Please make sure that you are taking your Vitamin D as directed. It is very important as a natural antiinflammatory helping hair, skin, and nails, as well as reducing stroke and heart attack risk. It helps your bones and helps with mood. It also decreases numerous cancer risks so please take it as directed, take 5000 IU daily.
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
## 2013-10-20 LAB — ANTI-DNA ANTIBODY, DOUBLE-STRANDED: ds DNA Ab: 1 IU/mL

## 2013-10-20 LAB — ANA: Anti Nuclear Antibody(ANA): NEGATIVE

## 2013-11-17 ENCOUNTER — Other Ambulatory Visit: Payer: Self-pay | Admitting: Sports Medicine

## 2013-12-06 ENCOUNTER — Encounter: Payer: Self-pay | Admitting: Sports Medicine

## 2013-12-06 ENCOUNTER — Ambulatory Visit (INDEPENDENT_AMBULATORY_CARE_PROVIDER_SITE_OTHER): Payer: BC Managed Care – PPO | Admitting: Sports Medicine

## 2013-12-06 VITALS — BP 122/73

## 2013-12-06 DIAGNOSIS — M12812 Other specific arthropathies, not elsewhere classified, left shoulder: Secondary | ICD-10-CM | POA: Insufficient documentation

## 2013-12-06 DIAGNOSIS — M75102 Unspecified rotator cuff tear or rupture of left shoulder, not specified as traumatic: Secondary | ICD-10-CM | POA: Diagnosis not present

## 2013-12-06 DIAGNOSIS — M67929 Unspecified disorder of synovium and tendon, unspecified upper arm: Secondary | ICD-10-CM | POA: Insufficient documentation

## 2013-12-06 DIAGNOSIS — M67921 Unspecified disorder of synovium and tendon, right upper arm: Secondary | ICD-10-CM | POA: Diagnosis not present

## 2013-12-06 DIAGNOSIS — M7542 Impingement syndrome of left shoulder: Secondary | ICD-10-CM

## 2013-12-06 MED ORDER — NITROGLYCERIN 0.2 MG/HR TD PT24: MEDICATED_PATCH | TRANSDERMAL | Status: DC

## 2013-12-06 NOTE — Assessment & Plan Note (Addendum)
-  Start NTG protocol -Therapeutic exercises:  1. Spokes of the wheel with a 1-2 lb wt. Do 8-10 times x 3 repeats 1-2 times daily, both arms. 2. Right elbow bend to 90 degrees, hold 1-2 lb wt.. Turn Palm up and down x 10 times x 3 repeats, 1-2 times daily. 3. Lawn mower pulls with a 1-2 lb wt. Do 8-10 times x 3 repeats, 1-2 times daily, both arms. -20lb lifting restriction and no above shoulder lifting note provided for her employer -follow-up in 4-6 weeks for repeat US

## 2013-12-06 NOTE — Progress Notes (Signed)
Subjective:    Patient ID: Gabriela [name], female    DOB: [birth year], 58 y.o.   MRN: [id number]  HPI Ms. Auzenne is a 58 year old right-hand-dominant female who presents with left shoulder pain. Onset of symptoms was approximately one month ago, without any known acute injury. 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 This aggravates her symptoms. Location of pain is primarily superior and lateral left shoulder. She denies any significant radiation of her symptoms. She denies any associated neck pain, numbness, or tingling. She says that she feels that her left shoulder is also weak. Subjectively she feels that there is a catching sensation around her shoulder. She denies noticing any rash or bruising. She denies any sensation of shoulder instability. She has tried ibuprofen and Vicoprofen. The pain will frequently wake her up at night.   Medical & Surgical Hx: Reviewed: Significant for major depressive disorder, rotator cuff impingement, chronic right-sided neck pain, chronic cervical radiculopathy  Medications: Reviewed & Updated - see associated section, noted for Vicoprofen  Social History: Reviewed - reports that she has never smoked. She has never used smokeless tobacco.   Past medical history, social history, medications, and allergies were reviewed and are up to date in the chart.  Review of Systems 7 point review of systems was performed and was otherwise negative unless noted in the history of present illness.    Objective:   Physical Exam BP 122/73  LMP 03/09/2011 GEN: The patient is well-developed well-nourished female and in no acute distress.  She is awake alert and oriented x3. SKIN: warm and well-perfused, no rash  Neuro: Strength 5/5 globally. Sensation intact throughout. DTRs 2/4 bilaterally. No focal deficits. Vasc: +2 bilateral distal pulses. No edema.  MSK:  Atrophy: none   The right biceps long head appears to  detach, and she has a "Popeye deformity".  Cervical ROM Full  Shoulder ROM: Right <---> Left   Forward flexion 180<--->180 ER at side 60<--->60 Abd ER 90<--->90 Abd IR 60<--->60 IR up back T6<--->T6  TTP:  AC joint:  + left  Supraspinatus insertion:  +  Subsca/biceps:  -  Periscapular:  -  Trap:  -  Cuff: Impingement/cuff: Hawkins +   Jobes: +   FF strength:5/5   ER strength:5/5   Abdominal compression test:- Lag signs:None  Laxity/instabilty:  -  Yergason:  +           Speeds:  -    Crank:  -                     Active Compression: -  Neurovascular: Normal sensation to light touch in median ulnar and radial nerve distribution with good strength in hand intrinsics grip and EPL, 2+ radial pulse.  Limited musculoskeletal ultrasound: Long and short axis views were obtained of the left and right shoulder. Examination of left shoulder reveals that the biceps tendon appears intact. The subscapularis, infraspinatus, and teres minor appear normal. There is a 10-15% partial-thickness tearing seen in the articulation of the supraspinatus tendon in the midportion that correlates with where the patient has pain. She has some spurring and irregularity of her humeral head, which is mild. Her a.c. joint has some calcification seen in the joint, with minimal surrounding fluid edema. Examination of the right biceps tendon reveals an area of significant tendon thinning, with surrounding hypoechoic density suggestive of fluid     Assessment & Plan:  Please see problem  based assessment and plan in the problem list.

## 2013-12-06 NOTE — Patient Instructions (Addendum)
Nitroglycerin Protocol   Apply 1/4 nitroglycerin patch to affected area daily.  Change position of patch within the affected area every 24 hours.  You may experience a headache during the first 1-2 weeks of using the patch, these should subside.  If you experience headaches after beginning nitroglycerin patch treatment, you may take your preferred over the counter pain reliever.  Another side effect of the nitroglycerin patch is skin irritation or rash related to patch adhesive.  Please notify our office if you develop more severe headaches or rash, and stop the patch.  Tendon healing with nitroglycerin patch may require 12 to 24 weeks depending on the extent of injury.  Men should not use if taking Viagra, Cialis, or Levitra.   Do not use if you have migraines or rosacea.   1. Spokes of the wheel with a 1-2 lb wt. Do 8-10 times x 3 repeats 1-2 times daily, both arms. 2. Right elbow bend to 90 degrees, hold 1-2 lb wt.. Turn Palm up and down x 10 times x 3 repeats, 1-2 times daily. 3. Lawn mower pulls with a 1-2 lb wt. Do 8-10 times x 3 repeats, 1-2 times daily, both arms.

## 2013-12-06 NOTE — Assessment & Plan Note (Signed)
-   Start the following therapeutic exercises 1. Spokes of the wheel with a 1-2 lb wt. Do 8-10 times x 3 repeats 1-2 times daily, both arms. 2. Right elbow bend to 90 degrees, hold 1-2 lb wt.. Turn Palm up and down x 10 times x 3 repeats, 1-2 times daily. 3. Lawn mower pulls with a 1-2 lb wt. Do 8-10 times x 3 repeats, 1-2 times daily, both arms. -Note given for work for 20lb lifting and no above shoulder lifting restriction -follow-up in 4-6 weeks for re-eval and repeat US

## 2014-01-10 ENCOUNTER — Other Ambulatory Visit: Payer: Self-pay | Admitting: Physician Assistant

## 2014-01-10 ENCOUNTER — Other Ambulatory Visit: Payer: Self-pay | Admitting: Sports Medicine

## 2014-01-27 ENCOUNTER — Encounter: Payer: Self-pay | Admitting: *Deleted

## 2014-01-31 ENCOUNTER — Other Ambulatory Visit: Payer: Self-pay | Admitting: *Deleted

## 2014-01-31 MED ORDER — HYDROCODONE-IBUPROFEN 7.5-200 MG PO TABS: 1.0000 | ORAL_TABLET | ORAL | Status: DC | PRN

## 2014-01-31 NOTE — Telephone Encounter (Signed)
Dr. Oneida Alar says ok to refill this but pt will need to make a follow up appt. Pt aware and will pickup rx.

## 2014-03-01 ENCOUNTER — Other Ambulatory Visit: Payer: Self-pay | Admitting: Sports Medicine

## 2014-03-01 ENCOUNTER — Encounter: Payer: Self-pay | Admitting: Physician Assistant

## 2014-03-01 ENCOUNTER — Other Ambulatory Visit: Payer: Self-pay | Admitting: *Deleted

## 2014-03-01 ENCOUNTER — Other Ambulatory Visit: Payer: Self-pay | Admitting: Physician Assistant

## 2014-03-01 MED ORDER — DIAZEPAM 5 MG PO TABS: 5.0000 mg | ORAL_TABLET | Freq: Three times a day (TID) | ORAL | Status: DC | PRN

## 2014-03-01 MED ORDER — GABAPENTIN 300 MG PO CAPS: 300.0000 mg | ORAL_CAPSULE | Freq: Three times a day (TID) | ORAL | Status: DC

## 2014-04-14 ENCOUNTER — Other Ambulatory Visit: Payer: Self-pay | Admitting: Sports Medicine

## 2014-04-18 ENCOUNTER — Encounter: Payer: Self-pay | Admitting: Physician Assistant

## 2014-04-18 ENCOUNTER — Ambulatory Visit (INDEPENDENT_AMBULATORY_CARE_PROVIDER_SITE_OTHER): Payer: BLUE CROSS/BLUE SHIELD | Admitting: Physician Assistant

## 2014-04-18 VITALS — BP 128/70 | HR 84 | Temp 97.7°F | Resp 16 | Ht 66.5 in | Wt 176.0 lb

## 2014-04-18 DIAGNOSIS — R7303 Prediabetes: Secondary | ICD-10-CM

## 2014-04-18 DIAGNOSIS — E559 Vitamin D deficiency, unspecified: Secondary | ICD-10-CM

## 2014-04-18 DIAGNOSIS — R7309 Other abnormal glucose: Secondary | ICD-10-CM

## 2014-04-18 DIAGNOSIS — R5383 Other fatigue: Secondary | ICD-10-CM

## 2014-04-18 DIAGNOSIS — F329 Major depressive disorder, single episode, unspecified: Secondary | ICD-10-CM

## 2014-04-18 DIAGNOSIS — G4700 Insomnia, unspecified: Secondary | ICD-10-CM

## 2014-04-18 DIAGNOSIS — Z79899 Other long term (current) drug therapy: Secondary | ICD-10-CM

## 2014-04-18 DIAGNOSIS — F32A Depression, unspecified: Secondary | ICD-10-CM

## 2014-04-18 DIAGNOSIS — F419 Anxiety disorder, unspecified: Secondary | ICD-10-CM

## 2014-04-18 LAB — CBC WITH DIFFERENTIAL/PLATELET
BASOS ABS: 0 10*3/uL (ref 0.0–0.1)
Basophils Relative: 0 % (ref 0–1)
Eosinophils Absolute: 0.1 10*3/uL (ref 0.0–0.7)
Eosinophils Relative: 2 % (ref 0–5)
HCT: 39.3 % (ref 36.0–46.0)
Hemoglobin: 13.2 g/dL (ref 12.0–15.0)
Lymphocytes Relative: 29 % (ref 12–46)
Lymphs Abs: 1.5 10*3/uL (ref 0.7–4.0)
MCH: 30.2 pg (ref 26.0–34.0)
MCHC: 33.6 g/dL (ref 30.0–36.0)
MCV: 89.9 fL (ref 78.0–100.0)
MPV: 9.1 fL (ref 8.6–12.4)
Monocytes Absolute: 0.6 10*3/uL (ref 0.1–1.0)
Monocytes Relative: 11 % (ref 3–12)
NEUTROS PCT: 58 % (ref 43–77)
Neutro Abs: 3 10*3/uL (ref 1.7–7.7)
PLATELETS: 287 10*3/uL (ref 150–400)
RBC: 4.37 MIL/uL (ref 3.87–5.11)
RDW: 13.2 % (ref 11.5–15.5)
WBC: 5.1 10*3/uL (ref 4.0–10.5)

## 2014-04-18 MED ORDER — DIAZEPAM 5 MG PO TABS: 5.0000 mg | ORAL_TABLET | Freq: Three times a day (TID) | ORAL | Status: DC | PRN

## 2014-04-18 MED ORDER — METFORMIN HCL ER 500 MG PO TB24: 500.0000 mg | ORAL_TABLET | Freq: Two times a day (BID) | ORAL | Status: DC

## 2014-04-18 NOTE — Patient Instructions (Addendum)
Your A1C is a measure of your sugar over the past 3 months and is not affected by what you have eaten over the past few days. Diabetes increases your chances of stroke and heart attack over 300 % and is the leading cause of blindness and kidney failure in the [place]. Please make sure you decrease bad carbs like white bread, white rice, potatoes, corn, soft drinks, pasta, cereals, refined sugars, sweet tea, dried fruits, and fruit juice. Good carbs are okay to eat in moderation like sweet potatoes, brown rice, whole grain pasta/bread, most fruit (except dried fruit) and you can eat as many veggies as you want.   Greater than 6.5 is considered diabetic. Between 6.4 and 5.7 is prediabetic If your A1C is less than 5.7 you are NOT diabetic.  Targets for Glucose Readings: Time of Check Target for patients WITHOUT Diabetes Target for DIABETICS  Before Meals Less than 100  less than 150  Two hours after meals Less than 200  Less than 250   We are starting you on Metformin to prevent or treat diabetes. Metformin does not cause low blood sugars. In order to create energy your cells need insulin and sugar but sometime your cells do not accept the insulin and this can cause increased sugars and decreased energy. The Metformin helps your cells accept insulin and the sugar to give you more energy.   The two most common side effects are nausea and diarrhea, follow these rules to avoid it! You can take imodium per box instructions when starting metformin if needed.   Rules of metformin: 1) start out slow with only one pill daily. Our goal for you is 4 pills a day or 2000mg  total.  2) take with your largest meal. 3) Take with least amount of carbs.   Call if you have any problems.    Diabetes is a very complicated disease...lets simplify it.  An easy way to look at it to understand the complications is if you think of the extra sugar floating in your blood stream as glass shards floating through your  blood stream.    Diabetes affects your small vessels first: 1) The glass shards (sugar) scraps down the tiny blood vessels in your eyes and lead to diabetic retinopathy, the leading cause of blindness in the [place]. Diabetes is the leading cause of newly diagnosed adult (94 to 57 years of age) blindness in the [place].  2) The glass shards scratches down the tiny vessels of your legs leading to nerve damage called neuropathy and can lead to amputations of your feet. More than 60% of all non-traumatic amputations of lower limbs occur in people with diabetes.  3) Over time the small vessels in your brain are shredded and closed off, individually this does not cause any problems but over a long period of time many of the small vessels being blocked can lead to Vascular Dementia.   4) Your kidney's are a filter system and have a "net" that keeps certain things in the body and lets bad things out. Sugar shreds this net and leads to kidney damage and eventually failure. Decreasing the sugar that is destroying the net and certain blood pressure medications can help stop or decrease progression of kidney disease. Diabetes was the primary cause of kidney failure in 44 percent of all new cases in 2011.  5) Diabetes also destroys the small vessels in your penis that lead to erectile dysfunction. Eventually the vessels are so damaged that you  may not be responsive to cialis or viagra.   Diabetes and your large vessels: Your larger vessels consist of your coronary arteries in your heart and the carotid vessels to your brain. Diabetes or even increased sugars put you at 300% increased risk of heart attack and stroke and this is why.. The sugar scrapes down your large blood vessels and your body sees this as an internal injury and tries to repair itself. Just like you get a scab on your skin, your platelets will stick to the blood vessel wall trying to heal it. This is why we have diabetics on low dose aspirin  daily, this prevents the platelets from sticking and can prevent plaque formation. In addition, your body takes cholesterol and tries to shove it into the open wound. This is why we want your LDL, or bad cholesterol, below 70.   The combination of platelets and cholesterol over 5-10 years forms plaque that can break off and cause a heart attack or stroke.   PLEASE REMEMBER:  Diabetes is preventable! Up to 2 percent of complications and morbidities among individuals with type 2 diabetes can be prevented, delayed, or effectively treated and minimized with regular visits to a health professional, appropriate monitoring and medication, and a healthy diet and lifestyle.  Before you even begin to attack a weight-loss plan, it pays to remember this: You are not fat. You have fat. Losing weight isn't about blame or shame; it's simply another achievement to accomplish. Dieting is like any other skill-you have to buckle down and work at it. As long as you act in a smart, reasonable way, you'll ultimately get where you want to be. Here are some weight loss pearls for you.  1. It's Not a Diet. It's a Lifestyle Thinking of a diet as something you're on and suffering through only for the short term doesn't work. To shed weight and keep it off, you need to make permanent changes to the way you eat. It's OK to indulge occasionally, of course, but if you cut calories temporarily and then revert to your old way of eating, you'll gain back the weight quicker than you can say yo-yo. Use it to lose it. Research shows that one of the best predictors of long-term weight loss is how many pounds you drop in the first month. For that reason, nutritionists often suggest being stricter for the first two weeks of your new eating strategy to build momentum. Cut out added sugar and alcohol and avoid unrefined carbs. After that, figure out how you can reincorporate them in a way that's healthy and maintainable.  2. There's a Right Way  to Exercise Working out burns calories and fat and boosts your metabolism by building muscle. But those trying to lose weight are notorious for overestimating the number of calories they burn and underestimating the amount they take in. Unfortunately, your system is biologically programmed to hold on to extra pounds and that means when you start exercising, your body senses the deficit and ramps up its hunger signals. If you're not diligent, you'll eat everything you burn and then some. Use it to lose it. Cardio gets all the exercise glory, but strength and interval training are the real heroes. They help you build lean muscle, which in turn increases your metabolism and calorie-burning ability 3. Don't Overreact to Mild Hunger Some people have a hard time losing weight because of hunger anxiety. To them, being hungry is bad-something to be avoided at all costs-so they carry  snacks with them and eat when they don't need to. Others eat because they're stressed out or bored. While you never want to get to the point of being ravenous (that's when bingeing is likely to happen), a hunger pang, a craving, or the fact that it's 3:00 p.m. should not send you racing for the vending machine or obsessing about the energy bar in your purse. Ideally, you should put off eating until your stomach is growling and it's difficult to concentrate.  Use it to lose it. When you feel the urge to eat, use the HALT method. Ask yourself, Am I really hungry? Or am I angry or anxious, lonely or bored, or tired? If you're still not certain, try the apple test. If you're truly hungry, an apple should seem delicious; if it doesn't, something else is going on. Or you can try drinking water and making yourself busy, if you are still hungry try a healthy snack.  4. Not All Calories Are Created Equal The mechanics of weight loss are pretty simple: Take in fewer calories than you use for energy. But the kind of food you eat makes all the  difference. Processed food that's high in saturated fat and refined starch or sugar can cause inflammation that disrupts the hormone signals that tell your brain you're full. The result: You eat a lot more.  Use it to lose it. Clean up your diet. Swap in whole, unprocessed foods, including vegetables, lean protein, and healthy fats that will fill you up and give you the biggest nutritional bang for your calorie buck. In a few weeks, as your brain starts receiving regular hunger and fullness signals once again, you'll notice that you feel less hungry overall and naturally start cutting back on the amount you eat.  5. Protein, Produce, and Plant-Based Fats Are Your Weight-Loss [name] Here's why eating the three Ps regularly will help you drop pounds. Protein fills you up. You need it to build lean muscle, which keeps your metabolism humming so that you can torch more fat. People in a weight-loss program who ate double the recommended daily allowance for protein (about 110 grams for a 150-pound woman) lost 70 percent of their weight from fat, while people who ate the RDA lost only about 40 percent, one study found. Produce is packed with filling fiber. "It's very difficult to consume too many calories if you're eating a lot of vegetables. Example: Three cups of broccoli is a lot of food, yet only 93 calories. (Fruit is another story. It can be easy to overeat and can contain a lot of calories from sugar, so be sure to monitor your intake.) Plant-based fats like olive oil and those in avocados and nuts are healthy and extra satiating.  Use it to lose it. Aim to incorporate each of the three Ps into every meal and snack. People who eat protein throughout the day are able to keep weight off, according to a study in the [place] of Clinical Nutrition. In addition to meat, poultry and seafood, good sources are beans, lentils, eggs, tofu, and yogurt. As for fat, keep portion sizes in check by measuring out  salad dressing, oil, and nut butters (shoot for one to two tablespoons). Finally, eat veggies or a little fruit at every meal. People who did that consumed 308 fewer calories but didn't feel any hungrier than when they didn't eat more produce.  7. How You Eat Is As Important As What You Eat In order for your brain  to register that you're full, you need to focus on what you're eating. Sit down whenever you eat, preferably at a table. Turn off the TV or computer, put down your phone, and look at your food. Smell it. Chew slowly, and don't put another bite on your fork until you swallow. When women ate lunch this attentively, they consumed 30 percent less when snacking later than those who listened to an audiobook at lunchtime, according to a study in the [place] of Nutrition. 8. Weighing Yourself Really Works The scale provides the best evidence about whether your efforts are paying off. Seeing the numbers tick up or down or stagnate is motivation to keep going-or to rethink your approach. A 2015 study at [facility] found that daily weigh-ins helped people lose more weight, keep it off, and maintain that loss, even after two years. Use it to lose it. Step on the scale at the same time every day for the best results. If your weight shoots up several pounds from one weigh-in to the next, don't freak out. Eating a lot of salt the night before or having your period is the likely culprit. The number should return to normal in a day or two. It's a steady climb that you need to do something about. 9. Too Much Stress and Too Little Sleep Are Your Enemies When you're tired and frazzled, your body cranks up the production of cortisol, the stress hormone that can cause carb cravings. Not getting enough sleep also boosts your levels of ghrelin, a hormone associated with hunger, while suppressing leptin, a hormone that signals fullness and satiety. People on a diet who slept only five and a half hours a  night for two weeks lost 55 percent less fat and were hungrier than those who slept eight and a half hours, according to a study in the [place]. Use it to lose it. Prioritize sleep, aiming for seven hours or more a night, which research shows helps lower stress. And make sure you're getting quality zzz's. If a snoring spouse or a fidgety cat wakes you up frequently throughout the night, you may end up getting the equivalent of just four hours of sleep, according to a study from [facility]. Keep pets out of the bedroom, and use a white-noise app to drown out snoring. 10. You Will Hit a plateau-And You Can Bust Through It As you slim down, your body releases much less leptin, the fullness hormone.  If you're not strength training, start right now. Building muscle can raise your metabolism to help you overcome a plateau. To keep your body challenged and burning calories, incorporate new moves and more intense intervals into your workouts or add another sweat session to your weekly routine. Alternatively, cut an extra 100 calories or so a day from your diet. Now that you've lost weight, your body simply doesn't need as much fuel.  I think it is possible that you have sleep apnea. It can cause interrupted sleep, headaches, frequent awakenings, fatigue, dry mouth, fast/slow heart beats, memory issues, anxiety/depression, swelling, numbness tingling hands/feet, weight gain, shortness of breath, and the list goes on. Sleep apnea needs to be ruled out because if it is left untreated it does eventually lead to abnormal heart beats, lung failure or heart failure as well as increasing the risk of heart attack and stroke. There are masks you can wear OR a mouth piece that I can give you information about. Often times though people feel  MUCH better after getting treatment.   Sleep Apnea  Sleep apnea is a sleep disorder characterized by abnormal pauses in breathing while you sleep. When  your breathing pauses, the level of oxygen in your blood decreases. This causes you to move out of deep sleep and into light sleep. As a result, your quality of sleep is poor, and the system that carries your blood throughout your body (cardiovascular system) experiences stress. If sleep apnea remains untreated, the following conditions can develop:  High blood pressure (hypertension).  Coronary artery disease.  Inability to achieve or maintain an erection (impotence).  Impairment of your thought process (cognitive dysfunction). There are three types of sleep apnea: 1. Obstructive sleep apnea--Pauses in breathing during sleep because of a blocked airway. 2. Central sleep apnea--Pauses in breathing during sleep because the area of the brain that controls your breathing does not send the correct signals to the muscles that control breathing. 3. Mixed sleep apnea--A combination of both obstructive and central sleep apnea.  RISK FACTORS The following risk factors can increase your risk of developing sleep apnea:  Being overweight.  Smoking.  Having narrow passages in your nose and throat.  Being of older age.  Being female.  Alcohol use.  Sedative and tranquilizer use.  Ethnicity. Among individuals younger than 35 years, African Americans are at increased risk of sleep apnea. SYMPTOMS   Difficulty staying asleep.  Daytime sleepiness and fatigue.  Loss of energy.  Irritability.  Loud, heavy snoring.  Morning headaches.  Trouble concentrating.  Forgetfulness.  Decreased interest in sex. DIAGNOSIS  In order to diagnose sleep apnea, your caregiver will perform a physical examination. Your caregiver may suggest that you take a home sleep test. Your caregiver may also recommend that you spend the night in a sleep lab. In the sleep lab, several monitors record information about your heart, lungs, and brain while you sleep. Your leg and arm movements and blood oxygen level are  also recorded. TREATMENT The following actions may help to resolve mild sleep apnea:  Sleeping on your side.   Using a decongestant if you have nasal congestion.   Avoiding the use of depressants, including alcohol, sedatives, and narcotics.   Losing weight and modifying your diet if you are overweight. There also are devices and treatments to help open your airway:  Oral appliances. These are custom-made mouthpieces that shift your lower jaw forward and slightly open your bite. This opens your airway.  Devices that create positive airway pressure. This positive pressure "splints" your airway open to help you breathe better during sleep. The following devices create positive airway pressure:  Continuous positive airway pressure (CPAP) device. The CPAP device creates a continuous level of air pressure with an air pump. The air is delivered to your airway through a mask while you sleep. This continuous pressure keeps your airway open.  Nasal expiratory positive airway pressure (EPAP) device. The EPAP device creates positive air pressure as you exhale. The device consists of single-use valves, which are inserted into each nostril and held in place by adhesive. The valves create very little resistance when you inhale but create much more resistance when you exhale. That increased resistance creates the positive airway pressure. This positive pressure while you exhale keeps your airway open, making it easier to breath when you inhale again.  Bilevel positive airway pressure (BPAP) device. The BPAP device is used mainly in patients with central sleep apnea. This device is similar to the CPAP device because it also  uses an air pump to deliver continuous air pressure through a mask. However, with the BPAP machine, the pressure is set at two different levels. The pressure when you exhale is lower than the pressure when you inhale.  Surgery. Typically, surgery is only done if you cannot comply with  less invasive treatments or if the less invasive treatments do not improve your condition. Surgery involves removing excess tissue in your airway to create a wider passage way. Document Released: 01/17/2002 Document Revised: 05/24/2012 Document Reviewed: 06/05/2011 [facility] Patient Information 2015 [place], [place]. This information is not intended to replace advice given to you by your health care provider. Make sure you discuss any questions you have with your health care provider.   Can call Dr. Toy Cookey to get evaluated for dental sleep appliance. # [phone]  OR you can try Dr. Clearence Ped in [facility] # [phone]  We will send notes. Call and get price quote on both.
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
## 2014-04-19 LAB — BASIC METABOLIC PANEL WITH GFR
BUN: 14 mg/dL (ref 6–23)
CALCIUM: 10.4 mg/dL (ref 8.4–10.5)
CO2: 28 mEq/L (ref 19–32)
Chloride: 106 mEq/L (ref 96–112)
Creat: 0.72 mg/dL (ref 0.50–1.10)
Glucose, Bld: 94 mg/dL (ref 70–99)
Potassium: 4.2 mEq/L (ref 3.5–5.3)
Sodium: 142 mEq/L (ref 135–145)

## 2014-04-19 LAB — LIPID PANEL
CHOL/HDL RATIO: 3 ratio
Cholesterol: 198 mg/dL (ref 0–200)
HDL: 67 mg/dL (ref >=46)
LDL CALC: 108 mg/dL — AB (ref 0–99)
Triglycerides: 114 mg/dL (ref <150)
VLDL: 23 mg/dL (ref 0–40)

## 2014-04-19 LAB — VITAMIN D 25 HYDROXY (VIT D DEFICIENCY, FRACTURES): Vit D, 25-Hydroxy: 52 ng/mL (ref 30–100)

## 2014-04-19 LAB — HEPATIC FUNCTION PANEL
ALBUMIN: 4.6 g/dL (ref 3.5–5.2)
ALT: 19 U/L (ref 0–35)
AST: 18 U/L (ref 0–37)
Alkaline Phosphatase: 46 U/L (ref 39–117)
BILIRUBIN DIRECT: 0.1 mg/dL (ref 0.0–0.3)
BILIRUBIN TOTAL: 0.4 mg/dL (ref 0.2–1.2)
Indirect Bilirubin: 0.3 mg/dL (ref 0.2–1.2)
Total Protein: 7 g/dL (ref 6.0–8.3)

## 2014-04-19 LAB — HEMOGLOBIN A1C
Hgb A1c MFr Bld: 5.6 % (ref <5.7)
Mean Plasma Glucose: 114 mg/dL (ref <117)

## 2014-04-19 LAB — MAGNESIUM: Magnesium: 2 mg/dL (ref 1.5–2.5)

## 2014-04-19 LAB — INSULIN, FASTING: INSULIN FASTING, SERUM: 13.3 u[IU]/mL (ref 2.0–19.6)

## 2014-04-19 LAB — TSH: TSH: 1.14 u[IU]/mL (ref 0.350–4.500)

## 2014-04-26 ENCOUNTER — Ambulatory Visit (INDEPENDENT_AMBULATORY_CARE_PROVIDER_SITE_OTHER): Payer: BLUE CROSS/BLUE SHIELD | Admitting: Sports Medicine

## 2014-04-26 ENCOUNTER — Encounter: Payer: Self-pay | Admitting: Sports Medicine

## 2014-04-26 VITALS — BP 121/76 | HR 84 | Ht 66.0 in | Wt 171.0 lb

## 2014-04-26 DIAGNOSIS — M67921 Unspecified disorder of synovium and tendon, right upper arm: Secondary | ICD-10-CM

## 2014-04-26 DIAGNOSIS — M7542 Impingement syndrome of left shoulder: Secondary | ICD-10-CM

## 2014-04-26 DIAGNOSIS — S29011A Strain of muscle and tendon of front wall of thorax, initial encounter: Secondary | ICD-10-CM | POA: Insufficient documentation

## 2014-04-26 DIAGNOSIS — M75102 Unspecified rotator cuff tear or rupture of left shoulder, not specified as traumatic: Secondary | ICD-10-CM

## 2014-04-26 DIAGNOSIS — S29091A Other injury of muscle and tendon of front wall of thorax, initial encounter: Secondary | ICD-10-CM

## 2014-04-26 DIAGNOSIS — M75101 Unspecified rotator cuff tear or rupture of right shoulder, not specified as traumatic: Secondary | ICD-10-CM

## 2014-04-26 DIAGNOSIS — M7541 Impingement syndrome of right shoulder: Secondary | ICD-10-CM

## 2014-04-26 MED ORDER — HYDROCODONE-IBUPROFEN 7.5-200 MG PO TABS: 1.0000 | ORAL_TABLET | ORAL | Status: DC | PRN

## 2014-04-26 NOTE — Assessment & Plan Note (Signed)
R sided Provided with therapeutic exercises:  1. Straight arm raises at 45 degrees with a 1-2 lb wt. Do 8-10 times x 3 repeats 1-2 times daily, both arms. 2. Light biceps curls 3. Pec strengthening exercises 4. Resisted external and internal rotation Continue PO pain medication Continue with job adjustments RTC 2-3 mo

## 2014-04-26 NOTE — Progress Notes (Signed)
Subjective:    Patient ID: Gabriela [name], female    DOB: 1955-06-25, 59 y.o.   MRN: [id number]  HPI Gabriela [name] is a 59 year old right-hand-dominant female who is here for follow up of bilateral shoulder pain, now R > L. Onset of symptoms without any known acute injury. 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 This aggravates her symptoms. Location of pain is primarily superior and lateral left shoulder. She denies any significant radiation of her symptoms. She denies any associated neck pain, numbness, or tingling.  She denies noticing any rash or bruising. She denies any sensation of shoulder instability. She takes  ibuprofen and Vicoprofen ( 1 total tab each day cut in 4). The pain will frequently wake her up at night.  She did start using a nitro patch on her L shoulder after last visit but started using another 1/4 on her R and experienced a senstation of rattling in her head when she awoke from sleep so she stopped using the patch. She had taken gabapentin and valium that night.    She also has not been able to do her home physical therapy exercises. Primairly general fatigue. Work and financial worries.   Medical & Surgical Hx: Reviewed: Significant for major depressive disorder, rotator cuff impingement, chronic right-sided neck pain, chronic cervical radiculopathy  Hx of depression. Medications: Reviewed & Updated - see associated section, noted for Vicoprofen  Social History: Reviewed - reports that she has never smoked. She has never used smokeless tobacco.   Past medical history, social history, medications, and allergies were reviewed and are up to date in the chart.  Review of Systems 7 point review of systems was performed and was otherwise negative unless noted in the history of present illness.    Objective:   Physical Exam BP 121/76 mmHg  Pulse 84  Ht 5\' 6"  (1.676 m)  Wt 171 lb (77.565 kg)  BMI 27.61 kg/m2  LMP  03/09/2011 GEN: The patient is well-developed well-nourished female and in no acute distress.  She is awake alert and oriented x3. SKIN: warm and well-perfused, no rash  Neuro: Strength 5/5 globally. Sensation intact throughout. DTRs 2/4 bilaterally. No focal deficits. Vasc: +2 bilateral distal pulses. No edema.  MSK:  Atrophy: none    Cervical ROM Full  Shoulder ROM: Right <---> Left   Forward flexion 180<--->180 ER at side 60<--->60 Abd ER 75<--->90 Abd IR 60<--->60 IR up back T6<--->T6  TTP:  R AC joint, L biceps  Cuff: Impingement/cuff: Hawkins +    FF strength:4/5   ER strength:4/5     Speeds: negative bilaterally   Neurovascular: Normal sensation to light touch in median ulnar and radial nerve distribution with good strength in hand intrinsics grip and EPL, 2+ radial pulse.  Limited musculoskeletal ultrasound: Long and short axis views were obtained of the left and right shoulder. Examination of left shoulder reveals that the biceps tendon appears intact, however there is fluid at the intersection of pec and biceps, suggesting separation. The subscapularis, infraspinatus, and teres minor appear normal. No evidence of tear of supraspinatus, just mild calcification and possible thinning.  She has some spurring and irregularity of her humeral head, which is mild.  Examination of right shoulder reveals that the biceps tendon appears intact with no surrounding fluid. The subscapularis, infraspinatus, and teres minor appear normal. No evidence of tear of supraspinatus. She has some spurring and irregularity of her humeral head, which is  mild.    Assessment & Plan:   Problem List Items Addressed This Visit    Rotator cuff impingement syndrome    R sided Provided with therapeutic exercises:  1. Straight arm raises at 45 degrees with a 1-2 lb wt. Do 8-10 times x 3 repeats 1-2 times daily, both arms. 2. Light biceps curls 3. Pec strengthening exercises 4. Resisted external and  internal rotation Continue PO pain medication Continue with job adjustments RTC 2-3 mo        Pectoralis muscle strain - Primary    L sided US shows fluid at intersection of pec and biceps Will focus on pec exercise and mild biceps exercise Continue PO pain medication RTC 2-3 mo

## 2014-04-26 NOTE — Assessment & Plan Note (Signed)
L sided US shows fluid at intersection of pec and biceps Will focus on pec exercise and mild biceps exercise Continue PO pain medication RTC 2-3 mo

## 2014-04-26 NOTE — Assessment & Plan Note (Signed)
This looks to have resolved on today's [place]

## 2014-04-26 NOTE — Assessment & Plan Note (Signed)
See above  On this visit the Left RC looks good but some separation at the pec/BT intersection at ant humerus

## 2014-05-26 ENCOUNTER — Other Ambulatory Visit: Payer: Self-pay | Admitting: Sports Medicine

## 2014-06-27 ENCOUNTER — Encounter: Payer: Self-pay | Admitting: Physician Assistant

## 2014-06-28 ENCOUNTER — Other Ambulatory Visit: Payer: BLUE CROSS/BLUE SHIELD | Admitting: Sports Medicine

## 2014-07-11 ENCOUNTER — Other Ambulatory Visit: Payer: Self-pay | Admitting: *Deleted

## 2014-07-11 MED ORDER — HYDROCODONE-IBUPROFEN 7.5-200 MG PO TABS: 1.0000 | ORAL_TABLET | ORAL | Status: DC | PRN

## 2014-07-11 MED ORDER — GABAPENTIN 300 MG PO CAPS: 300.0000 mg | ORAL_CAPSULE | Freq: Three times a day (TID) | ORAL | Status: DC

## 2014-07-26 ENCOUNTER — Encounter: Payer: Self-pay | Admitting: Sports Medicine

## 2014-07-26 ENCOUNTER — Ambulatory Visit (INDEPENDENT_AMBULATORY_CARE_PROVIDER_SITE_OTHER): Payer: BLUE CROSS/BLUE SHIELD | Admitting: Sports Medicine

## 2014-07-26 VITALS — BP 144/77 | HR 74 | Ht 66.0 in | Wt 175.0 lb

## 2014-07-26 DIAGNOSIS — M67929 Unspecified disorder of synovium and tendon, unspecified upper arm: Secondary | ICD-10-CM | POA: Diagnosis not present

## 2014-07-26 DIAGNOSIS — M7540 Impingement syndrome of unspecified shoulder: Secondary | ICD-10-CM

## 2014-07-26 DIAGNOSIS — M75100 Unspecified rotator cuff tear or rupture of unspecified shoulder, not specified as traumatic: Secondary | ICD-10-CM | POA: Diagnosis not present

## 2014-07-26 NOTE — Assessment & Plan Note (Signed)
She has had some chronic pain more on RT but both shoulders Started with RC injury in 2013  Continue some of exercises as before  Cautiously retry NTG during day if desired  Reck 3 mo

## 2014-07-26 NOTE — Progress Notes (Signed)
Subjective: Gabriela [name] is a 59 y.o. female with a PMH of PTSD, anxiety, major depressive disorder, and reported cervical pathology, presenting today for followup of her right shoulder pain. She states that she has regular pain on her right shoulder around the insertion of the pectoralis muscles, as well as posteriorly, superior to the scapular spine. She occasionally has pain that radiates down the length of her right arm all the way to the 4th and 5th fingers. She is controlling her symptoms now vicoprofen and ibuprofen, with incomplete relief. She says her pain is worst after a long day at work. It sometimes interferes with her sleep because she prefers to sleep on her side. She endorses clicking and popping, but denies any weakness or instability.  She does do repetitive UE movement in work  Objective: Gen: Pleasant female in no acute distress BP 144/77 mmHg  Pulse 74  Ht 5\' 6"  (1.676 m)  Wt 175 lb (79.379 kg)  BMI 28.26 kg/m2  LMP 03/09/2011  Right shoulder -- Inspection: Symmetric with left, and without any obvious atrophy or deformity Palpation: Nontender throughout Range of motion: Decreased internal rotation versus the left side; wrist gets to mid-thoracic versus upper thoracic on the contralateral side. Range of motion is otherwise intact. No pain through range of motion. Strength: Intact and symmetric throughout. Special tests: Hawkin's positive, empty can positive, internal rotation lag sign positive due to pain, negative arm drop test.  Ultrasonography: Right shoulder: A sizeable pseudobursae is appreciated surrounding the biceps tendon. There is also a small separation of the pectoralis insertion. Left shoulder: A sizeable pseudobursae is appreciated surrounding the biceps tendon, very similar to on the opposite side.  Assessment and Plan: Gabriela [name] is a 59 y.o. female with a PMH of PTSD, anxiety, major depressive disorder, and reported cervical pathology,  presenting today for followup of her right shoulder pain. Her symptoms are consistent with biceps tendinopathy and some pectoralis tendinopathy as well, both confirmed on ultrasonography. I have advised her to do biceps curls and wrist rotation exercises regularly. I have asked her to try putting 1/4 patch nitroglycerin over the painful area of her right shoulder, and continue her antiinflammatory medications. I also advised menthol-containing gels or creams as necessary, with icing after days of heavy activity. I have asked her to follow up with me in 3-4 months as needed so that we can continue to work on this.

## 2014-07-26 NOTE — Assessment & Plan Note (Signed)
Biceps specific exercise  Will not restrict her too much as this slowly recovers

## 2014-08-10 ENCOUNTER — Ambulatory Visit (INDEPENDENT_AMBULATORY_CARE_PROVIDER_SITE_OTHER): Payer: BLUE CROSS/BLUE SHIELD

## 2014-08-10 ENCOUNTER — Ambulatory Visit (INDEPENDENT_AMBULATORY_CARE_PROVIDER_SITE_OTHER): Payer: BLUE CROSS/BLUE SHIELD | Admitting: Physician Assistant

## 2014-08-10 VITALS — BP 118/80 | HR 81 | Temp 98.4°F | Resp 16 | Ht 66.0 in | Wt 174.6 lb

## 2014-08-10 DIAGNOSIS — M79672 Pain in left foot: Secondary | ICD-10-CM

## 2014-08-10 NOTE — Patient Instructions (Signed)
Your xray was normal and did not show a fracture.  Rest, ice, elevation, and tylenol every 4-5 hours will help. Please let us know if you're not improved in 4-5 days.

## 2014-08-10 NOTE — Progress Notes (Signed)
   Subjective:    Patient ID: Gabriela [name], female    DOB: [birth year], 57 y.o.   MRN: [id number]  Chief Complaint  Patient presents with  . Foot Injury    vacuum cleaner slammed on top of foot last night x left foot   Medications, allergies, past medical history, surgical history, family history, social history and problem list reviewed and updated.  HPI  39 yof presents after injury to dorsum left foot.   Dropped vacuum cleaner on dorsum foot last evening. Able to walk today but with some pain. Wearing shoe into clinic.   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   Review of Systems No fevers, chills.     Objective:   Physical Exam  Constitutional: She is oriented to person, place, and time. She appears well-developed and well-nourished.  Non-toxic appearance. She does not have a sickly appearance. She does not appear ill. No distress.  BP 118/80 mmHg  Pulse 81  Temp(Src) 98.4 F (36.9 C) (Oral)  Resp 16  Ht 5\' 6"  (1.676 m)  Wt 174 lb 9.6 oz (79.198 kg)  BMI 28.19 kg/m2  SpO2 98%  LMP 03/09/2011   Musculoskeletal:       Right ankle: Normal. Achilles tendon normal.       Right foot: There is tenderness and bony tenderness. There is normal range of motion, normal capillary refill, no deformity and no laceration.       Feet:  TTP over dorsum foot. Normal rom, normal strength, normal sensation. Normal position sense. 2+ DP pulse. Normal cap refill.   Neurological: She is alert and oriented to person, place, and time.   [facility] reading (PRIMARY) by  Dr. Marin Comment. Left foot findings: Normal.      Assessment & Plan:   34 yof presents after injury to dorsum left foot.   Acute foot pain, left - Plan: DG Foot Complete Left --normal xray, no signs fx --rest, elevate, ice, tylenol q4-6 hrs --rtc if no improvement 4-5 days  Julieta Gutting, PA-C Physician  [facility]  08/10/2014 1:49 PM

## 2014-08-31 ENCOUNTER — Other Ambulatory Visit: Payer: Self-pay

## 2014-09-01 ENCOUNTER — Other Ambulatory Visit: Payer: Self-pay | Admitting: Physician Assistant

## 2014-09-01 MED ORDER — TRIAMCINOLONE ACETONIDE 55 MCG/ACT NA AERO
2.0000 | INHALATION_SPRAY | Freq: Every day | NASAL | Status: DC
Start: 2014-09-01 — End: 2019-03-29

## 2014-09-07 ENCOUNTER — Other Ambulatory Visit: Payer: Self-pay | Admitting: Sports Medicine

## 2014-09-07 ENCOUNTER — Other Ambulatory Visit: Payer: Self-pay | Admitting: *Deleted

## 2014-09-07 MED ORDER — GABAPENTIN 300 MG PO CAPS: 300.0000 mg | ORAL_CAPSULE | Freq: Three times a day (TID) | ORAL | Status: DC

## 2014-10-05 ENCOUNTER — Other Ambulatory Visit: Payer: Self-pay | Admitting: Physician Assistant

## 2014-10-09 NOTE — Telephone Encounter (Signed)
Called Rx into [facility].  

## 2014-10-17 ENCOUNTER — Telehealth: Payer: Self-pay | Admitting: Sports Medicine

## 2014-10-18 NOTE — Telephone Encounter (Signed)
Is this ok to refill?  

## 2014-10-20 ENCOUNTER — Other Ambulatory Visit: Payer: Self-pay | Admitting: *Deleted

## 2014-10-20 MED ORDER — HYDROCODONE-IBUPROFEN 7.5-200 MG PO TABS: 1.0000 | ORAL_TABLET | Freq: Four times a day (QID) | ORAL | Status: DC | PRN

## 2014-10-24 ENCOUNTER — Ambulatory Visit (INDEPENDENT_AMBULATORY_CARE_PROVIDER_SITE_OTHER): Payer: BLUE CROSS/BLUE SHIELD | Admitting: Physician Assistant

## 2014-10-24 ENCOUNTER — Other Ambulatory Visit (HOSPITAL_COMMUNITY)
Admission: RE | Admit: 2014-10-24 | Discharge: 2014-10-24 | Disposition: A | Discharge status: Home / Self Care | Payer: BLUE CROSS/BLUE SHIELD | Source: Ambulatory Visit | Attending: Physician Assistant | Admitting: Physician Assistant

## 2014-10-24 ENCOUNTER — Encounter: Payer: Self-pay | Admitting: Physician Assistant

## 2014-10-24 VITALS — BP 120/66 | HR 68 | Temp 97.2°F | Resp 16 | Ht 66.5 in | Wt 173.6 lb

## 2014-10-24 DIAGNOSIS — G4700 Insomnia, unspecified: Secondary | ICD-10-CM

## 2014-10-24 DIAGNOSIS — E559 Vitamin D deficiency, unspecified: Secondary | ICD-10-CM

## 2014-10-24 DIAGNOSIS — Z0000 Encounter for general adult medical examination without abnormal findings: Secondary | ICD-10-CM | POA: Diagnosis not present

## 2014-10-24 DIAGNOSIS — Z1151 Encounter for screening for human papillomavirus (HPV): Secondary | ICD-10-CM | POA: Insufficient documentation

## 2014-10-24 DIAGNOSIS — Z124 Encounter for screening for malignant neoplasm of cervix: Secondary | ICD-10-CM | POA: Diagnosis not present

## 2014-10-24 DIAGNOSIS — Z79899 Other long term (current) drug therapy: Secondary | ICD-10-CM | POA: Diagnosis not present

## 2014-10-24 DIAGNOSIS — M5412 Radiculopathy, cervical region: Secondary | ICD-10-CM

## 2014-10-24 DIAGNOSIS — I10 Essential (primary) hypertension: Secondary | ICD-10-CM | POA: Diagnosis not present

## 2014-10-24 DIAGNOSIS — F32A Depression, unspecified: Secondary | ICD-10-CM

## 2014-10-24 DIAGNOSIS — Z0001 Encounter for general adult medical examination with abnormal findings: Secondary | ICD-10-CM

## 2014-10-24 DIAGNOSIS — Z01419 Encounter for gynecological examination (general) (routine) without abnormal findings: Secondary | ICD-10-CM | POA: Diagnosis present

## 2014-10-24 DIAGNOSIS — F419 Anxiety disorder, unspecified: Secondary | ICD-10-CM

## 2014-10-24 DIAGNOSIS — Z13220 Encounter for screening for lipoid disorders: Secondary | ICD-10-CM

## 2014-10-24 DIAGNOSIS — D649 Anemia, unspecified: Secondary | ICD-10-CM

## 2014-10-24 DIAGNOSIS — R7303 Prediabetes: Secondary | ICD-10-CM

## 2014-10-24 DIAGNOSIS — F329 Major depressive disorder, single episode, unspecified: Secondary | ICD-10-CM

## 2014-10-24 LAB — CBC WITH DIFFERENTIAL/PLATELET
BASOS ABS: 0.1 10*3/uL (ref 0.0–0.1)
BASOS PCT: 1 % (ref 0–1)
Eosinophils Absolute: 0.3 10*3/uL (ref 0.0–0.7)
Eosinophils Relative: 5 % (ref 0–5)
HEMATOCRIT: 39.2 % (ref 36.0–46.0)
Hemoglobin: 12.8 g/dL (ref 12.0–15.0)
LYMPHS PCT: 30 % (ref 12–46)
Lymphs Abs: 1.7 10*3/uL (ref 0.7–4.0)
MCH: 29.4 pg (ref 26.0–34.0)
MCHC: 32.7 g/dL (ref 30.0–36.0)
MCV: 89.9 fL (ref 78.0–100.0)
MPV: 9.3 fL (ref 8.6–12.4)
Monocytes Absolute: 0.6 10*3/uL (ref 0.1–1.0)
Monocytes Relative: 10 % (ref 3–12)
NEUTROS ABS: 3.1 10*3/uL (ref 1.7–7.7)
NEUTROS PCT: 54 % (ref 43–77)
Platelets: 308 10*3/uL (ref 150–400)
RBC: 4.36 MIL/uL (ref 3.87–5.11)
RDW: 13.2 % (ref 11.5–15.5)
WBC: 5.7 10*3/uL (ref 4.0–10.5)

## 2014-10-24 MED ORDER — METFORMIN HCL ER 500 MG PO TB24: 500.0000 mg | ORAL_TABLET | Freq: Two times a day (BID) | ORAL | Status: DC

## 2014-10-24 MED ORDER — DULOXETINE HCL 30 MG PO CPEP: 30.0000 mg | ORAL_CAPSULE | Freq: Every day | ORAL | Status: DC

## 2014-10-24 MED ORDER — DIAZEPAM 5 MG PO TABS: 5.0000 mg | ORAL_TABLET | Freq: Two times a day (BID) | ORAL | Status: DC | PRN

## 2014-10-24 MED ORDER — CYCLOBENZAPRINE HCL 5 MG PO TABS: ORAL_TABLET | ORAL | Status: DC

## 2014-10-24 NOTE — Patient Instructions (Addendum)
Trigger Point Dry Needling   What is Trigger Point Dry Needling (DN)?   1. DN is a physical therapy technique used to treat muscle pain and     Dysfunction.  Specifically, DN helps deactivate muscle trigger    points (Muscle Knots).   2. A thin filiform needle is used to penetrate the skin and stimulate    the underlying trigger point.  The goal is for a local twitch     response (LTR) to occur and for the trigger point to relax.  No    medication of any kind is injected during the procedure.   What Does Trigger Point Dry Needling Feel Like?   1. The procedures feels different for each individual patient.   Some    patients report that they do not actually feel the      needle enter the skin and overall the process is not  painful.  Very    mild bleeding may occur.  However, many patients feel a deep    cramping in the muscle in which the needle was inserted. This is t   he local twitch response.    How Will I Feel After The Treatment?   1. Soreness is normal, and the onset of soreness may not occur for    a few hours.  Typically this soreness does not last longer than two    days.   2. Bruising is uncommon, however; ice can be used to decrease any    possible bruising.   3. In rare cases feeling tired or nauseous after the treatment is    normal.  In addition, your symptoms may get worse before they    get better, this period will typically not last longer than 24 hours.   What Can I do After My Treatment?   1.  Increase your hydration by drinking more water for the next 24    hours.   2.  You may place ice or heat on the areas treated that have become    sore, however don not use heat on inflamed or bruised areas.     Heat often brings more relief post needling.   3. You can continue your regular activities, but vigorous activity is    not recommended initially after the treatment for 24 hours.   4. DN is best combined with other physical therapy such as     strengthening, stretching, and  other therapies.     We are starting you on Metformin to prevent or treat diabetes. Metformin does not cause low blood sugars. In order to create energy your cells need insulin and sugar but sometime your cells do not accept the insulin and this can cause increased sugars and decreased energy. The Metformin helps your cells accept insulin and the sugar to give you more energy.   The two most common side effects are nausea and diarrhea, follow these rules to avoid it! You can take imodium per box instructions when starting metformin if needed.   Rules of metformin: 1) start out slow with only one pill daily. Our goal for you is 4 pills a day or 2000mg  total.  2) take with your largest meal. 3) Take with least amount of carbs.   Call if you have any problems.   We want weight loss that will last so you should lose 1-2 pounds a week.  THAT IS IT! Please pick THREE things a month to change. Once it is a habit check  off the item. Then pick another three items off the list to become habits.  If you are already doing a habit on the list GREAT!  Cross that item off! o Don't drink your calories. Ie, alcohol, soda, fruit juice, and sweet tea.  o Drink more water. Drink a glass when you feel hungry or before each meal.  o Eat breakfast - Complex carb and protein (likeDannon light and fit yogurt, oatmeal, fruit, eggs, [place] bacon). o Measure your cereal.  Eat no more than one cup a day. (ie [place]) o Eat an apple a day. o Add a vegetable a day. o Try a new vegetable a month. o Use Pam! Stop using oil or butter to cook. o Don't finish your plate or use smaller plates. o Share your dessert. o Eat sugar free Jello for dessert or frozen grapes. o Don't eat 2-3 hours before bed. o Switch to whole wheat bread, pasta, and brown rice. o Make healthier choices when you eat out. No fries! o Pick baked chicken, NOT fried. o Don't forget to SLOW DOWN when you eat. It is not going anywhere.  o Take the  stairs. o Park far away in the parking lot o [employer] (or weights) for 10 minutes while watching TV. o Walk at work for 10 minutes during break. o Walk outside 1 time a week with your friend, kids, dog, or significant other. o Start a walking group at [place] the mall as much as you can tolerate.  o Keep a food diary. o Weigh yourself daily. o Walk for 15 minutes 3 days per week. o Cook at home more often and eat out less.  If life happens and you go back to old habits, it is okay.  Just start over. You can do it!   If you experience chest pain, get short of breath, or tired during the exercise, please stop immediately and inform your doctor.
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
## 2014-10-25 ENCOUNTER — Encounter: Payer: Self-pay | Admitting: Physician Assistant

## 2014-10-25 LAB — HEPATIC FUNCTION PANEL
ALK PHOS: 48 U/L (ref 33–130)
ALT: 20 U/L (ref 6–29)
AST: 20 U/L (ref 10–35)
Albumin: 4.2 g/dL (ref 3.6–5.1)
BILIRUBIN DIRECT: 0.1 mg/dL (ref <=0.2)
BILIRUBIN INDIRECT: 0.4 mg/dL (ref 0.2–1.2)
Total Bilirubin: 0.5 mg/dL (ref 0.2–1.2)
Total Protein: 6.6 g/dL (ref 6.1–8.1)

## 2014-10-25 LAB — VITAMIN B12: Vitamin B-12: 469 pg/mL (ref 211–911)

## 2014-10-25 LAB — BASIC METABOLIC PANEL WITH GFR
BUN: 17 mg/dL (ref 7–25)
CALCIUM: 10.3 mg/dL (ref 8.6–10.4)
CHLORIDE: 105 mmol/L (ref 98–110)
CO2: 28 mmol/L (ref 20–31)
Creat: 0.74 mg/dL (ref 0.50–1.05)
GFR, Est African American: >89 mL/min (ref >=60)
GLUCOSE: 93 mg/dL (ref 65–99)
POTASSIUM: 4.3 mmol/L (ref 3.5–5.3)
SODIUM: 139 mmol/L (ref 135–146)

## 2014-10-25 LAB — URINALYSIS, ROUTINE W REFLEX MICROSCOPIC
Bilirubin Urine: NEGATIVE
Glucose, UA: NEGATIVE
Hgb urine dipstick: NEGATIVE
Ketones, ur: NEGATIVE
LEUKOCYTES UA: NEGATIVE
Nitrite: NEGATIVE
PROTEIN: NEGATIVE
SPECIFIC GRAVITY, URINE: 1.007 (ref 1.001–1.035)
pH: 7 (ref 5.0–8.0)

## 2014-10-25 LAB — HEMOGLOBIN A1C
HEMOGLOBIN A1C: 5.5 % (ref <5.7)
MEAN PLASMA GLUCOSE: 111 mg/dL (ref <117)

## 2014-10-25 LAB — LIPID PANEL
CHOL/HDL RATIO: 3.2 ratio (ref <=5.0)
Cholesterol: 211 mg/dL — ABNORMAL HIGH (ref 125–200)
HDL: 66 mg/dL (ref >=46)
LDL CALC: 120 mg/dL (ref <130)
Triglycerides: 125 mg/dL (ref <150)
VLDL: 25 mg/dL (ref <30)

## 2014-10-25 LAB — MICROALBUMIN / CREATININE URINE RATIO: Creatinine, Urine: 22.4 mg/dL

## 2014-10-25 LAB — INSULIN, FASTING: Insulin fasting, serum: 3.3 u[IU]/mL (ref 2.0–19.6)

## 2014-10-25 LAB — VITAMIN D 25 HYDROXY (VIT D DEFICIENCY, FRACTURES): VIT D 25 HYDROXY: 58 ng/mL (ref 30–100)

## 2014-10-25 LAB — TSH: TSH: 1.499 u[IU]/mL (ref 0.350–4.500)

## 2014-10-25 LAB — IRON AND TIBC
%SAT: 39 % (ref 11–50)
IRON: 123 ug/dL (ref 45–160)
TIBC: 312 ug/dL (ref 250–450)
UIBC: 189 ug/dL (ref 125–400)

## 2014-10-25 LAB — MAGNESIUM: Magnesium: 2.2 mg/dL (ref 1.5–2.5)

## 2014-10-25 LAB — FERRITIN: Ferritin: 180 ng/mL (ref 10–291)

## 2014-10-26 LAB — CYTOLOGY - PAP

## 2014-11-06 ENCOUNTER — Ambulatory Visit
Admission: RE | Admit: 2014-11-06 | Discharge: 2014-11-06 | Disposition: A | Discharge status: Home / Self Care | Payer: BLUE CROSS/BLUE SHIELD | Source: Ambulatory Visit | Attending: Sports Medicine | Admitting: Sports Medicine

## 2014-11-06 ENCOUNTER — Ambulatory Visit (INDEPENDENT_AMBULATORY_CARE_PROVIDER_SITE_OTHER): Payer: BLUE CROSS/BLUE SHIELD | Admitting: Sports Medicine

## 2014-11-06 ENCOUNTER — Encounter: Payer: Self-pay | Admitting: Sports Medicine

## 2014-11-06 ENCOUNTER — Other Ambulatory Visit: Payer: Self-pay | Admitting: Sports Medicine

## 2014-11-06 VITALS — Ht 66.0 in | Wt 170.0 lb

## 2014-11-06 DIAGNOSIS — M545 Low back pain: Secondary | ICD-10-CM

## 2014-11-06 DIAGNOSIS — M5441 Lumbago with sciatica, right side: Secondary | ICD-10-CM | POA: Diagnosis not present

## 2014-11-06 DIAGNOSIS — M5124 Other intervertebral disc displacement, thoracic region: Secondary | ICD-10-CM | POA: Insufficient documentation

## 2014-11-06 NOTE — Progress Notes (Signed)
HPI  CC: low back pain Patient is a 59 year old female who presents today with a new complaint of low back pain. Patient reports that pain has been ongoing for the past 10 days. Pain is localized to the lumbar spine and paraspinal muscles. She initially reported no radiation of pain however later stated that occasionally she did have some pain/discomfort that extended down the right leg to her heel. Patient states that recently at work she has been caring heavy boxes of liquor bottles. She denies any specific injury but states that a week and half ago the demands to move these boxes have increased. Patient states that she has been icing this region which has not been very beneficial. She has been taking some ibuprofen for this pain which has helped some.  ROS: Denies fever, chills, weight loss, weakness, numbness, paresthesias, saddle anesthesia, or bladder/bowel incontinence.  Objective: Ht 5\' 6"  (1.676 m)  Wt 170 lb (77.111 kg)  BMI 27.45 kg/m2  LMP 03/09/2011 Gen: NAD, alert, cooperative, and pleasant. Back Exam: - Inspection: No erythema, ecchymoses, or bony deformities.  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 - FABER: Negative bilaterally - Sensory change: Negative bilaterally - Reflex change: Diminished right patellar when compared to left (+1). +1 Achilles bilaterally Strength at foot - Plantar-flexion: 5 / 5    Dorsi-flexion: 5 / 5    Eversion: 5 / 5   Inversion: 5 / 5 Leg strength - Quad: 5 / 5   Hamstring: 5 / 5   Hip flexor: 4/5 right and 5/5 left;  Hip abductors: 5 / 5 Gait - Walking: Normal      Heels: Normal     Toes: Normal        Tandem: Normal Neuro: Alert and oriented, Speech clear, No gross deficits  Assessment and plan:  Back pain Symptoms are currently of unknown etiology. Cannot rule out radicular causes. Straight leg raise was negative, however right-sided hip flexors and patellar reflexes were diminished compared to left. No sensation losses. Tenderness to palpation would indicate muscle strain/irritation (b/c her symptoms would indicate lower than L1-2 involvement), with possible SI joint irritation. However, patient has a history of C-spine disc herniation and her concerns for lumbar disc herniation are appropriate. No red flag symptoms. - Two-view lumbothoracic x-ray - Lumbar MRI - Work restriction note provided; is not to lift more than 5 pounds. - Formal physical therapy prescription provided - Increase gabapentin as needed to 900 mg total daily - Ice and heat liberally - Follow-up in 3 weeks    Orders Placed This Encounter  Procedures  . MR Lumbar Spine Wo Contrast    Rule out herniated disk 170 LBS/NO CLAUS/NO METAL IN EYES OR BODY/NO IMPLANTS/NO SURGERY/NO NEEDS [facility] AND PT WITH EPIC ORDER    Standing Status: Future     Number of Occurrences:      Standing Expiration Date: 01/06/2016    Order Specific Question:  Reason for Exam (SYMPTOM  OR DIAGNOSIS REQUIRED)    Answer:  low back pain    Order Specific  Question:  Preferred imaging location?    Answer:  [id number]    Order Specific Question:  Does the patient have a pacemaker or implanted devices?    Answer:  No    Order Specific Question:  What is the patient's sedation requirement?    Answer:  No Sedation  . DG Lumbar Spine 2-3 Views    Standing Status: Future     Number of Occurrences: 1     Standing Expiration Date: 01/06/2016    Order Specific Question:  Reason for Exam (SYMPTOM  OR DIAGNOSIS REQUIRED)    Answer:  low back pain    Order Specific Question:  Is the patient  pregnant?    Answer:  No    Order Specific Question:  Preferred imaging location?    Answer:  [facility]    Elberta Leatherwood, MD,MS,  PGY2 11/06/2014 1:56 PM   Patient seen and evaluated with the above-named resident. I agree with the plan of care. There is concern about possible lumbar disc herniation so I'll go ahead and proceed with an MRI scan to evaluate further. X-rays of her lumbar spine shows some mild multilevel degenerative changes but nothing acute. X-rays of her thoracic spine are unremarkable. I do think that it is appropriate to restrict her from any heavy lifting at work. I also would like to start some physical therapy and have her return to the office in 3-4 weeks.

## 2014-11-06 NOTE — Assessment & Plan Note (Addendum)
Symptoms are currently of unknown etiology. Cannot rule out radicular causes. Straight leg raise was negative, however right-sided hip flexors and patellar reflexes were diminished compared to left. No sensation losses. Tenderness to palpation would indicate muscle strain/irritation (b/c her symptoms would indicate lower than L1-2 involvement), with possible SI joint irritation. However, patient has a history of C-spine disc herniation and her concerns for lumbar disc herniation are appropriate. No red flag symptoms. - Two-view lumbothoracic x-ray - Lumbar MRI - Work restriction note provided; is not to lift more than 5 pounds. - Formal physical therapy prescription provided - Increase gabapentin as needed to 900 mg total daily - Ice and heat liberally - Follow-up in 3 weeks

## 2014-11-06 NOTE — Patient Instructions (Signed)
It was a pleasure seeing you today in our clinic. Today we discussed your back pain. Here is the treatment plan we have discussed and agreed upon together:  - We have placed an order to have you get x-rays and an MRI of your lumbar spine.  - You can continue to take Ibuprofen (Advil, motrin) for this pain. We would recommend 600mg  3 times a day. Take this with food. We strongly encourage you to not take more than this.  - We have handed you an order for formal physical therapy. Please call that office to schedule an initial visit. - We have given you a note for work with work restrictions of lifting no more than 5 lbs. -- We ask that you make sure to continue this practice outside of work as well. - Follow up with [place] in 3 weeks  - Follow up sooner or report to the ED if you experience leg/foot numbness, weakness, or bladder/bowel incontinence.

## 2014-11-07 ENCOUNTER — Telehealth: Payer: Self-pay | Admitting: Sports Medicine

## 2014-11-07 NOTE — Telephone Encounter (Signed)
Patient will be notified via telephone of x-ray results. Thoracic spine x-rays unremarkable. Mild degenerative changes of the lumbar spine. Patient is concerned about lumbar disc herniation so we will proceed with MRI of the lumbar spine. She is asking about the possibility of an MRI of the thoracic spine but I do not believe that that is necessary. Follow-up with me as scheduled and we will discuss MRI findings at that time.

## 2014-11-08 ENCOUNTER — Ambulatory Visit
Admission: RE | Admit: 2014-11-08 | Discharge: 2014-11-08 | Disposition: A | Discharge status: Home / Self Care | Payer: BLUE CROSS/BLUE SHIELD | Source: Ambulatory Visit | Attending: Sports Medicine | Admitting: Sports Medicine

## 2014-11-08 ENCOUNTER — Telehealth: Payer: Self-pay | Admitting: Sports Medicine

## 2014-11-08 DIAGNOSIS — M5441 Lumbago with sciatica, right side: Secondary | ICD-10-CM

## 2014-11-08 NOTE — Telephone Encounter (Signed)
Spoke with the patient on the phone today after reviewing the MRI scan of her lumbar spine. Patient does have a moderately large central disc protrusion at T11-T12 causing mild spinal stenosis at this area. There is also an incidental finding of a large cyst in the left pelvis most likely originating from the left ovary. Neoplasm could not be excluded.  Regarding the T11-T12 disc herniation I think we need to try to treat this conservatively. I have explained the importance of physical therapy in this process. Patient understands. I will also order a pelvic ultrasound to better evaluate the cyst seen in her left pelvis. I will follow-up with her via telephone with those results once available. Patient will also keep her follow-up appointment with me as scheduled.

## 2014-11-14 ENCOUNTER — Other Ambulatory Visit: Payer: Self-pay | Admitting: *Deleted

## 2014-11-14 DIAGNOSIS — IMO0002 Reserved for concepts with insufficient information to code with codable children: Secondary | ICD-10-CM

## 2014-11-15 ENCOUNTER — Encounter: Payer: Self-pay | Admitting: Physician Assistant

## 2014-11-16 ENCOUNTER — Other Ambulatory Visit: Payer: Self-pay | Admitting: *Deleted

## 2014-11-16 DIAGNOSIS — IMO0002 Reserved for concepts with insufficient information to code with codable children: Secondary | ICD-10-CM

## 2014-11-20 ENCOUNTER — Ambulatory Visit
Admission: RE | Admit: 2014-11-20 | Discharge: 2014-11-20 | Disposition: A | Discharge status: Home / Self Care | Payer: BLUE CROSS/BLUE SHIELD | Source: Ambulatory Visit | Attending: Sports Medicine | Admitting: Sports Medicine

## 2014-11-20 ENCOUNTER — Telehealth: Payer: Self-pay | Admitting: Sports Medicine

## 2014-11-20 ENCOUNTER — Encounter: Payer: Self-pay | Admitting: Physician Assistant

## 2014-11-20 DIAGNOSIS — IMO0002 Reserved for concepts with insufficient information to code with codable children: Secondary | ICD-10-CM

## 2014-11-20 NOTE — Telephone Encounter (Signed)
I spoke with the patient today after reviewing the pelvic ultrasound. The radiologist comments that there is a rather large simple appearing ovarian cyst arising from the left ovary. The recommendation from the radiologist is for either an MRI of the pelvis or surgical consultation. Therefore, I've decided to refer the patient to gynecology for further workup and treatment. I did explain to the patient the concern about the possibility of ovarian cancer. She understands. I will defer further workup and treatment of this ovarian cyst to the gynecologist.

## 2014-11-23 ENCOUNTER — Other Ambulatory Visit: Payer: Self-pay | Admitting: *Deleted

## 2014-11-23 DIAGNOSIS — IMO0002 Reserved for concepts with insufficient information to code with codable children: Secondary | ICD-10-CM

## 2014-11-27 ENCOUNTER — Telehealth: Payer: Self-pay

## 2014-11-27 NOTE — Telephone Encounter (Signed)
Patient has an open referral sent to [place] to follow up with a pelvic cyst. Called patient in an attempt to schedule an appointment, no answer, left voicemail.

## 2014-12-04 ENCOUNTER — Encounter: Payer: Self-pay | Admitting: Sports Medicine

## 2014-12-04 ENCOUNTER — Ambulatory Visit (INDEPENDENT_AMBULATORY_CARE_PROVIDER_SITE_OTHER): Payer: BLUE CROSS/BLUE SHIELD | Admitting: Sports Medicine

## 2014-12-04 VITALS — BP 139/86 | HR 96 | Ht 66.0 in | Wt 173.0 lb

## 2014-12-04 DIAGNOSIS — M545 Low back pain: Secondary | ICD-10-CM

## 2014-12-04 NOTE — Progress Notes (Signed)
Patient ID: Gabriela [name], female   DOB: 18-May-1955, 59 y.o.   MRN: [id number]  Patient comes in today for follow-up on a T11-T12 thoracic disc herniation. Her pain has improved but only slightly. She does feel like physical therapy has been beneficial. She has an appointment with her gynecologist to have an incidental ovarian cyst seen on the MRI evaluated further. She does feel like the work restriction of no lifting greater than 5 pounds has been helpful but her job also requires repetitive bending which is aggravating her pain.  Physical exam was not repeated. We simply discussed her MRI findings. The disc herniation at T11-T12 is moderately large and does cause some mild flattening of the cord. Therefore, I would like to go ahead and elicit the input of one of the neurosurgeons. I have reassured her that I think that this will continue to be treated conservatively and I want her to continue with physical therapy. She will continue with her current work restrictions of no lifting greater than 5 pounds and I have also added that she avoid repetitive bending. At this point I think that it is best to defer further workup and treatment to the discretion of the neurosurgeons. I also reinforced the importance of seeing gynecology for further workup and treatment of her ovarian cyst. Follow-up with me as needed.  Total time spent with the patient was 15 minutes with 100% of the time spent in face-to-face consultation discussing her MRI, diagnosis, and treatment.

## 2014-12-12 ENCOUNTER — Encounter: Payer: Self-pay | Admitting: Family Medicine

## 2014-12-12 ENCOUNTER — Ambulatory Visit (INDEPENDENT_AMBULATORY_CARE_PROVIDER_SITE_OTHER): Payer: BLUE CROSS/BLUE SHIELD | Admitting: Family Medicine

## 2014-12-12 VITALS — BP 129/90 | HR 91 | Resp 18 | Ht 66.0 in | Wt 169.0 lb

## 2014-12-12 DIAGNOSIS — N83209 Unspecified ovarian cyst, unspecified side: Secondary | ICD-10-CM | POA: Insufficient documentation

## 2014-12-12 DIAGNOSIS — N83202 Unspecified ovarian cyst, left side: Secondary | ICD-10-CM

## 2014-12-12 NOTE — Progress Notes (Signed)
    Subjective:    Patient ID: Gabriela [name] is a 59 y.o. female presenting with No chief complaint on file.  on 12/12/2014  HPI: New patient referred for evaluation of an ovarian cyst.  Found as part of work up for herniated disks and found on MRI. Pelvic sono showed 7.7 cm left ovarian simple cyst.  Review of Systems  Constitutional: Negative for fever and chills.  Respiratory: Negative for shortness of breath.   Cardiovascular: Negative for chest pain.  Gastrointestinal: Negative for nausea, vomiting and abdominal pain.  Genitourinary: Negative for dysuria.  Skin: Negative for rash.      Objective:    LMP 03/09/2011 Physical Exam  Constitutional: She is oriented to person, place, and time. She appears well-developed and well-nourished. No distress.  HENT:  Head: Normocephalic and atraumatic.  Eyes: No scleral icterus.  Neck: Neck supple.  Cardiovascular: Normal rate.   Pulmonary/Chest: Effort normal.  Abdominal: Soft.  Neurological: She is alert and oriented to person, place, and time.  Skin: Skin is warm and dry.  Psychiatric: She has a normal mood and affect.        Assessment & Plan:   Problem List Items Addressed This Visit      Unprioritized   Ovarian cyst - Primary    Given that it is simple in nature and < 10 cm, will check CA125. Options discussed with patient, including immediate removal, serial u/s for stability. Risk of torsion discussed at length. Patient understands, recommendations will change if cyst increases in size or if CA125 is worrisome. Laparoscopic oophorectomy would be best choice for removal if needed.       Relevant Orders   CA 125   US Pelvis Complete   US Transvaginal Non-OB      Total face-to-face time with patient: 20 minutes. Over 50% of encounter was spent on counseling and coordination of care. Return in about 3 months (around 03/14/2015).  Glendia Olshefski S 12/12/2014 11:11 AM

## 2014-12-12 NOTE — Patient Instructions (Signed)
Ovarian Cyst An ovarian cyst is a fluid-filled sac that forms on an ovary. The ovaries are small organs that produce eggs in women. Various types of cysts can form on the ovaries. Most are not cancerous. Many do not cause problems, and they often go away on their own. Some may cause symptoms and require treatment. Common types of ovarian cysts include:  Functional cysts--These cysts may occur every month during the menstrual cycle. This is normal. The cysts usually go away with the next menstrual cycle if the woman does not get pregnant. Usually, there are no symptoms with a functional cyst.  Endometrioma cysts--These cysts form from the tissue that lines the uterus. They are also called "chocolate cysts" because they become filled with blood that turns brown. This type of cyst can cause pain in the lower abdomen during intercourse and with your menstrual period.  Cystadenoma cysts--This type develops from the cells on the outside of the ovary. These cysts can get very big and cause lower abdomen pain and pain with intercourse. This type of cyst can twist on itself, cut off its blood supply, and cause severe pain. It can also easily rupture and cause a lot of pain.  Dermoid cysts--This type of cyst is sometimes found in both ovaries. These cysts may contain different kinds of body tissue, such as skin, teeth, hair, or cartilage. They usually do not cause symptoms unless they get very big.  Theca lutein cysts--These cysts occur when too much of a certain hormone (human chorionic gonadotropin) is produced and overstimulates the ovaries to produce an egg. This is most common after procedures used to assist with the conception of a baby (in vitro fertilization). CAUSES   Fertility drugs can cause a condition in which multiple large cysts are formed on the ovaries. This is called ovarian hyperstimulation syndrome.  A condition called polycystic ovary syndrome can cause hormonal imbalances that can lead to  nonfunctional ovarian cysts. SIGNS AND SYMPTOMS  Many ovarian cysts do not cause symptoms. If symptoms are present, they may include:  Pelvic pain or pressure.  Pain in the lower abdomen.  Pain during sexual intercourse.  Increasing girth (swelling) of the abdomen.  Abnormal menstrual periods.  Increasing pain with menstrual periods.  Stopping having menstrual periods without being pregnant. DIAGNOSIS  These cysts are commonly found during a routine or annual pelvic exam. Tests may be ordered to find out more about the cyst. These tests may include:  Ultrasound.  X-ray of the pelvis.  CT scan.  MRI.  Blood tests. TREATMENT  Many ovarian cysts go away on their own without treatment. Your health care provider may want to check your cyst regularly for 2-3 months to see if it changes. For women in menopause, it is particularly important to monitor a cyst closely because of the higher rate of ovarian cancer in menopausal women. When treatment is needed, it may include any of the following:  A procedure to drain the cyst (aspiration). This may be done using a long needle and ultrasound. It can also be done through a laparoscopic procedure. This involves using a thin, lighted tube with a tiny camera on the end (laparoscope) inserted through a small incision.  Surgery to remove the whole cyst. This may be done using laparoscopic surgery or an open surgery involving a larger incision in the lower abdomen.  Hormone treatment or birth control pills. These methods are sometimes used to help dissolve a cyst. HOME CARE INSTRUCTIONS   Only take over-the-counter   or prescription medicines as directed by your health care provider.  Follow up with your health care provider as directed.  Get regular pelvic exams and Pap tests. SEEK MEDICAL CARE IF:   Your periods are late, irregular, or painful, or they stop.  Your pelvic pain or abdominal pain does not go away.  Your abdomen becomes  larger or swollen.  You have pressure on your bladder or trouble emptying your bladder completely.  You have pain during sexual intercourse.  You have feelings of fullness, pressure, or discomfort in your stomach.  You lose weight for no apparent reason.  You feel generally ill.  You become constipated.  You lose your appetite.  You develop acne.  You have an increase in body and facial hair.  You are gaining weight, without changing your exercise and eating habits.  You think you are pregnant. SEEK IMMEDIATE MEDICAL CARE IF:   You have increasing abdominal pain.  You feel sick to your stomach (nauseous), and you throw up (vomit).  You develop a fever that comes on suddenly.  You have abdominal pain during a bowel movement.  Your menstrual periods become heavier than usual. MAKE SURE YOU:  Understand these instructions.  Will watch your condition.  Will get help right away if you are not doing well or get worse.   This information is not intended to replace advice given to you by your health care provider. Make sure you discuss any questions you have with your health care provider.   Document Released: 01/27/2005 Document Revised: 02/01/2013 Document Reviewed: 10/04/2012 Elsevier Interactive Patient Education 2016 [employer]  

## 2014-12-12 NOTE — Assessment & Plan Note (Signed)
Given that it is simple in nature and < 10 cm, will check CA125. Options discussed with patient, including immediate removal, serial u/s for stability. Risk of torsion discussed at length. Patient understands, recommendations will change if cyst increases in size or if CA125 is worrisome. Laparoscopic oophorectomy would be best choice for removal if needed.

## 2014-12-13 ENCOUNTER — Other Ambulatory Visit: Payer: Self-pay | Admitting: *Deleted

## 2014-12-13 LAB — CA 125: CA 125: 32 U/mL (ref <35)

## 2014-12-13 MED ORDER — HYDROCODONE-IBUPROFEN 7.5-200 MG PO TABS: 1.0000 | ORAL_TABLET | Freq: Four times a day (QID) | ORAL | Status: DC | PRN

## 2014-12-15 ENCOUNTER — Telehealth: Payer: Self-pay

## 2014-12-15 NOTE — Telephone Encounter (Signed)
Gabriela [name] had an appointment at [facility] on 12/12/2014. After the appointment, attempted to schedule a pelvic ultrasound, patient declined assistance in scheduling stating she would call back later once she takes a look at her schedule. I called Gabriela [name] today to follow up with that, no answer, could no leave a message. Will try to follow up with this at another time.

## 2015-01-02 ENCOUNTER — Ambulatory Visit (HOSPITAL_COMMUNITY): Payer: BLUE CROSS/BLUE SHIELD

## 2015-01-05 ENCOUNTER — Ambulatory Visit (INDEPENDENT_AMBULATORY_CARE_PROVIDER_SITE_OTHER): Payer: BLUE CROSS/BLUE SHIELD | Admitting: Physician Assistant

## 2015-01-05 VITALS — BP 122/72 | HR 82 | Temp 99.3°F | Resp 18 | Ht 66.5 in | Wt 168.0 lb

## 2015-01-05 DIAGNOSIS — R8290 Unspecified abnormal findings in urine: Secondary | ICD-10-CM | POA: Diagnosis not present

## 2015-01-05 DIAGNOSIS — R21 Rash and other nonspecific skin eruption: Secondary | ICD-10-CM

## 2015-01-05 LAB — POCT URINALYSIS DIP (MANUAL ENTRY)
Bilirubin, UA: NEGATIVE
Glucose, UA: NEGATIVE
Leukocytes, UA: NEGATIVE
Nitrite, UA: NEGATIVE
PH UA: 6
Protein Ur, POC: NEGATIVE
RBC UA: NEGATIVE
Spec Grav, UA: 1.025
Urobilinogen, UA: 0.2

## 2015-01-05 LAB — POC MICROSCOPIC URINALYSIS (UMFC)

## 2015-01-05 MED ORDER — VALACYCLOVIR HCL 1 G PO TABS: 1000.0000 mg | ORAL_TABLET | Freq: Two times a day (BID) | ORAL | Status: DC
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
## 2015-01-05 NOTE — Patient Instructions (Signed)
Follow up with GYN if incontinence worsens/continues. Drink plenty of water. You have a prescription for valtrex at the pharmacy - ONLY fill this if rash worsens considerably Return as needed

## 2015-01-10 ENCOUNTER — Ambulatory Visit
Admission: RE | Admit: 2015-01-10 | Discharge: 2015-01-10 | Disposition: A | Discharge status: Home / Self Care | Payer: BLUE CROSS/BLUE SHIELD | Source: Ambulatory Visit | Attending: Family Medicine | Admitting: Family Medicine

## 2015-01-10 ENCOUNTER — Telehealth: Payer: Self-pay | Admitting: Family Medicine

## 2015-01-10 DIAGNOSIS — N83202 Unspecified ovarian cyst, left side: Secondary | ICD-10-CM

## 2015-01-11 ENCOUNTER — Telehealth: Payer: Self-pay

## 2015-01-11 NOTE — Telephone Encounter (Signed)
Gabriela [name] called, got after hours answering service, she left a message with them   "Caller states she is at work and will not be able to pick up so if you could leave a detailed voicemail of what she is suppose to do that would be great; if not please leave a message of when to call back on Friday"  I am unclear of what she is referring to. Returned her phone call, no answer, left voicemail instructing her to return my call here at the office tomorrow between the hours of 8am-noon.

## 2015-01-11 NOTE — Telephone Encounter (Signed)
Called pt, no answer, left message to call office.

## 2015-01-22 NOTE — Telephone Encounter (Signed)
Called pt, informed her of [place] result and the recommendation.  Pt states if she has to have surgery that she would prefer to do it at an outpatient surgical center due to her insurance coverage.  Spoke with Dr Kennon Rounds and she could possibly schedule the procedure at [facility].  Pt is going to check with her insurance carrier and will call back to let us know what she prefers to do.

## 2015-01-23 ENCOUNTER — Other Ambulatory Visit: Payer: Self-pay | Admitting: Physician Assistant

## 2015-01-26 ENCOUNTER — Ambulatory Visit (INDEPENDENT_AMBULATORY_CARE_PROVIDER_SITE_OTHER): Payer: BLUE CROSS/BLUE SHIELD | Admitting: Obstetrics and Gynecology

## 2015-01-26 ENCOUNTER — Encounter: Payer: Self-pay | Admitting: Obstetrics and Gynecology

## 2015-01-26 VITALS — BP 150/84 | HR 80 | Resp 14 | Ht 65.5 in | Wt 168.6 lb

## 2015-01-26 DIAGNOSIS — N83202 Unspecified ovarian cyst, left side: Secondary | ICD-10-CM | POA: Diagnosis not present

## 2015-01-26 DIAGNOSIS — D259 Leiomyoma of uterus, unspecified: Secondary | ICD-10-CM
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
## 2015-01-29 ENCOUNTER — Encounter: Payer: Self-pay | Admitting: Physician Assistant

## 2015-01-29 ENCOUNTER — Other Ambulatory Visit: Payer: Self-pay | Admitting: Internal Medicine

## 2015-01-29 DIAGNOSIS — F329 Major depressive disorder, single episode, unspecified: Secondary | ICD-10-CM

## 2015-01-29 DIAGNOSIS — Z0001 Encounter for general adult medical examination with abnormal findings: Secondary | ICD-10-CM

## 2015-01-29 DIAGNOSIS — F32A Depression, unspecified: Secondary | ICD-10-CM

## 2015-01-29 DIAGNOSIS — F419 Anxiety disorder, unspecified: Secondary | ICD-10-CM

## 2015-01-29 MED ORDER — DULOXETINE HCL 30 MG PO CPEP: 30.0000 mg | ORAL_CAPSULE | Freq: Every day | ORAL | Status: DC

## 2015-01-31 ENCOUNTER — Telehealth: Payer: Self-pay | Admitting: Obstetrics and Gynecology

## 2015-01-31 NOTE — Telephone Encounter (Signed)
Spoke with pt regarding benefit for surgery. Patient understood and agreeable. Patient ready to schedule. Patient provided surgery deposit over the phone. Patient aware this is professional benefit only. Patient aware will be contacted by hospital for separate benefits. Staff message to Sally for scheduling °

## 2015-02-02 ENCOUNTER — Telehealth: Payer: Self-pay | Admitting: *Deleted

## 2015-02-02 NOTE — Telephone Encounter (Signed)
Call to patient, left message to call back. 

## 2015-02-08 ENCOUNTER — Telehealth: Payer: Self-pay | Admitting: Obstetrics and Gynecology

## 2015-02-08 NOTE — Telephone Encounter (Signed)
Patient wants to talk with Dr. Elza Rafter nurse. No information given.

## 2015-02-08 NOTE — Telephone Encounter (Signed)
Spoke with patient. Patient states that she is in the process of scheduling surgery with Dr.Silva but has concerns about when she is to return to work after surgery. Patient states that she works at an [employer] and there is "no such thing as light work." States she was told she could return to work after 1 week with "light duty", but that she would not be able to work without restrictions for 1 month. Patient is requesting to be off for one month post-op due to heavy lifting at work and concerns with healing. Advised patient I will send a message to Dr.Silva who is out of the office this week for review. Advised she will be contacted with further information upon Dr.Silva's return. Patient would like to ensure this can be done prior to scheduling surgery.

## 2015-02-12 NOTE — Telephone Encounter (Signed)
I did tell patient that she will not be able to do heavy lifting for one month following laparoscopic surgery. She does heavy lifting at her work. Cc- Lamont Snowball

## 2015-02-14 ENCOUNTER — Telehealth: Payer: Self-pay | Admitting: *Deleted

## 2015-02-14 NOTE — Telephone Encounter (Signed)
Call to patient at work number. Patient advised of lifting restrictions during recovery period. See previous phone message. Patient is agreeable to proceed. Surgery date options discussed. 02-27-15 available or next available of 03-27-15. Patient prefers to proceed with 02-26-14. Surgery consult scheduled for 02-15-15 with Dr Quincy Simmonds.  Due to patient being at work, time restrictions right now, will review remaining surgery instructions at appointment tomorrow.  Case request sent to central scheduling.  Routing to provider for final review. Patient agreeable to disposition. Will close encounter.

## 2015-02-14 NOTE — Telephone Encounter (Signed)
Call to patient to check on available surgery date options. Left message to call back.

## 2015-02-14 NOTE — Telephone Encounter (Signed)
See next phone encounters. Surgery scheduled for 02-27-15.  Routing to provider for final review. Will close encounter.

## 2015-02-14 NOTE — Telephone Encounter (Signed)
See next phone encounter. Patient notified of Dr Elza Rafter response and surgery scheduled. Encounter closed.

## 2015-02-15 ENCOUNTER — Ambulatory Visit (INDEPENDENT_AMBULATORY_CARE_PROVIDER_SITE_OTHER): Payer: BLUE CROSS/BLUE SHIELD | Admitting: Obstetrics and Gynecology

## 2015-02-15 ENCOUNTER — Encounter: Payer: Self-pay | Admitting: Obstetrics and Gynecology

## 2015-02-15 VITALS — BP 130/76 | HR 76 | Resp 16 | Ht 65.5 in | Wt 166.0 lb

## 2015-02-15 DIAGNOSIS — N83202 Unspecified ovarian cyst, left side: Secondary | ICD-10-CM

## 2015-02-15 DIAGNOSIS — N644 Mastodynia: Secondary | ICD-10-CM | POA: Diagnosis not present

## 2015-02-15 NOTE — Progress Notes (Signed)
Diagnostic mammogram of left breast with ultrasound if indicated scheduled for Friday 02-23-15, 0945 at [place]. Patient agreeable to date and time. Surgery instruction sheet reviewed with patient and printed copy given, see copy scanned to chart.
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
## 2015-02-23 ENCOUNTER — Other Ambulatory Visit: Payer: Self-pay | Admitting: *Deleted

## 2015-02-23 MED ORDER — GABAPENTIN 300 MG PO CAPS
300.0000 mg | ORAL_CAPSULE | Freq: Three times a day (TID) | ORAL | Status: DC
Start: 2015-02-23 — End: 2015-06-29

## 2015-02-26 ENCOUNTER — Telehealth: Payer: Self-pay | Admitting: Obstetrics and Gynecology
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
## 2015-02-26 NOTE — Telephone Encounter (Signed)
Patient has surgery tomorrow and has some questions for you. [phone]

## 2015-02-26 NOTE — Telephone Encounter (Signed)
Return call to patient.Patient had questions regarding medications tonight and tomorrow morning. Advised to take medications tonight as scheduled. Advised not to take any medication tomorrow morning unless instructed to do so by hospital.  Patient usually takes Valium 5 mg at night, advised this is ok but advised not to take this after midnight and not to take it tomorrow morning.  Patient will drop off FMLA forms on her way to hospital in am. 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  Advised Dr Quincy Simmonds will review call and I will call her back if any additional instructions.

## 2015-02-26 NOTE — Telephone Encounter (Signed)
Encounter closed

## 2015-02-26 NOTE — Telephone Encounter (Signed)
No additional instructions.   Thank you!

## 2015-02-27 ENCOUNTER — Encounter (HOSPITAL_COMMUNITY): Payer: Self-pay | Admitting: *Deleted

## 2015-02-27 ENCOUNTER — Ambulatory Visit (HOSPITAL_COMMUNITY): Payer: BLUE CROSS/BLUE SHIELD | Admitting: Anesthesiology

## 2015-02-27 ENCOUNTER — Encounter (HOSPITAL_COMMUNITY)
Admission: RE | Disposition: A | Discharge status: Home / Self Care | Payer: Self-pay | Source: Ambulatory Visit | Attending: Obstetrics and Gynecology

## 2015-02-27 ENCOUNTER — Ambulatory Visit (HOSPITAL_COMMUNITY)
Admission: RE | Admit: 2015-02-27 | Discharge: 2015-02-27 | Disposition: A | Discharge status: Home / Self Care | Payer: BLUE CROSS/BLUE SHIELD | Source: Ambulatory Visit | Attending: Obstetrics and Gynecology | Admitting: Obstetrics and Gynecology

## 2015-02-27 DIAGNOSIS — D259 Leiomyoma of uterus, unspecified: Secondary | ICD-10-CM | POA: Insufficient documentation

## 2015-02-27 DIAGNOSIS — N83202 Unspecified ovarian cyst, left side: Secondary | ICD-10-CM | POA: Insufficient documentation

## 2015-02-27 DIAGNOSIS — Z0289 Encounter for other administrative examinations: Secondary | ICD-10-CM

## 2015-02-27 HISTORY — PX: LAPAROSCOPIC SALPINGO OOPHERECTOMY: SHX5927

## 2015-02-27 LAB — BASIC METABOLIC PANEL
Anion gap: 9 (ref 5–15)
BUN: 22 mg/dL — AB (ref 6–20)
CHLORIDE: 107 mmol/L (ref 101–111)
CO2: 24 mmol/L (ref 22–32)
CREATININE: 0.74 mg/dL (ref 0.44–1.00)
Calcium: 10 mg/dL (ref 8.9–10.3)
GFR calc Af Amer: >60 mL/min (ref >60)
GFR calc non Af Amer: >60 mL/min (ref >60)
GLUCOSE: 103 mg/dL — AB (ref 65–99)
POTASSIUM: 3.8 mmol/L (ref 3.5–5.1)
Sodium: 140 mmol/L (ref 135–145)

## 2015-02-27 LAB — CBC
HEMATOCRIT: 38.7 % (ref 36.0–46.0)
HEMOGLOBIN: 13 g/dL (ref 12.0–15.0)
MCH: 30.2 pg (ref 26.0–34.0)
MCHC: 33.6 g/dL (ref 30.0–36.0)
MCV: 89.8 fL (ref 78.0–100.0)
Platelets: 291 10*3/uL (ref 150–400)
RBC: 4.31 MIL/uL (ref 3.87–5.11)
RDW: 12.9 % (ref 11.5–15.5)
WBC: 6 10*3/uL (ref 4.0–10.5)

## 2015-02-27 SURGERY — SALPINGO-OOPHORECTOMY, LAPAROSCOPIC
Anesthesia: General | Site: Abdomen | Laterality: Left

## 2015-02-27 MED ORDER — ONDANSETRON HCL 4 MG/2ML IJ SOLN
INTRAMUSCULAR | Status: DC | PRN
  Administered 2015-02-27: 4 mg via INTRAVENOUS

## 2015-02-27 MED ORDER — SCOPOLAMINE 1 MG/3DAYS TD PT72
MEDICATED_PATCH | TRANSDERMAL | Status: DC
Start: 2015-02-27 — End: 2015-03-01
  Administered 2015-02-27: 1.5 mg via TRANSDERMAL
  Filled 2015-02-27: qty 1

## 2015-02-27 MED ORDER — KETOROLAC TROMETHAMINE 30 MG/ML IJ SOLN
INTRAMUSCULAR | Status: AC
  Filled 2015-02-27: qty 1

## 2015-02-27 MED ORDER — CEFAZOLIN SODIUM-DEXTROSE 2-3 GM-% IV SOLR
INTRAVENOUS | Status: AC
  Filled 2015-02-27: qty 50

## 2015-02-27 MED ORDER — DEXAMETHASONE SODIUM PHOSPHATE 4 MG/ML IJ SOLN
INTRAMUSCULAR | Status: AC
  Filled 2015-02-27: qty 1

## 2015-02-27 MED ORDER — SUGAMMADEX SODIUM 200 MG/2ML IV SOLN
INTRAVENOUS | Status: DC | PRN
  Administered 2015-02-27: 200 mg via INTRAVENOUS

## 2015-02-27 MED ORDER — LIDOCAINE HCL (CARDIAC) 20 MG/ML IV SOLN
INTRAVENOUS | Status: DC | PRN
  Administered 2015-02-27: 100 mg via INTRAVENOUS

## 2015-02-27 MED ORDER — ROCURONIUM BROMIDE 100 MG/10ML IV SOLN
INTRAVENOUS | Status: DC | PRN
  Administered 2015-02-27: 40 mg via INTRAVENOUS
  Administered 2015-02-27: 10 mg via INTRAVENOUS

## 2015-02-27 MED ORDER — LIDOCAINE HCL (CARDIAC) 20 MG/ML IV SOLN
INTRAVENOUS | Status: AC
  Filled 2015-02-27: qty 5

## 2015-02-27 MED ORDER — FENTANYL CITRATE (PF) 100 MCG/2ML IJ SOLN
INTRAMUSCULAR | Status: DC | PRN
  Administered 2015-02-27: 100 ug via INTRAVENOUS
  Administered 2015-02-27 (×3): 50 ug via INTRAVENOUS

## 2015-02-27 MED ORDER — DEXAMETHASONE SODIUM PHOSPHATE 4 MG/ML IJ SOLN
INTRAMUSCULAR | Status: DC | PRN
  Administered 2015-02-27: 4 mg via INTRAVENOUS

## 2015-02-27 MED ORDER — SUGAMMADEX SODIUM 200 MG/2ML IV SOLN
INTRAVENOUS | Status: AC
  Filled 2015-02-27: qty 2

## 2015-02-27 MED ORDER — LACTATED RINGERS IR SOLN
Status: DC | PRN
  Administered 2015-02-27: 3000 mL

## 2015-02-27 MED ORDER — HYDROCODONE-ACETAMINOPHEN 5-325 MG PO TABS: 1.0000 | ORAL_TABLET | Freq: Once | ORAL | Status: DC

## 2015-02-27 MED ORDER — HEPARIN SODIUM (PORCINE) 5000 UNIT/ML IJ SOLN
INTRAMUSCULAR | Status: AC
  Filled 2015-02-27: qty 1

## 2015-02-27 MED ORDER — BUPIVACAINE HCL (PF) 0.25 % IJ SOLN
INTRAMUSCULAR | Status: AC
  Filled 2015-02-27: qty 30

## 2015-02-27 MED ORDER — SCOPOLAMINE 1 MG/3DAYS TD PT72
1.0000 | MEDICATED_PATCH | Freq: Once | TRANSDERMAL | Status: DC
  Administered 2015-02-27: 1.5 mg via TRANSDERMAL

## 2015-02-27 MED ORDER — LACTATED RINGERS IV SOLN: INTRAVENOUS | Status: DC

## 2015-02-27 MED ORDER — PROPOFOL 10 MG/ML IV BOLUS
INTRAVENOUS | Status: AC
  Filled 2015-02-27: qty 20

## 2015-02-27 MED ORDER — FENTANYL CITRATE (PF) 250 MCG/5ML IJ SOLN
INTRAMUSCULAR | Status: AC
  Filled 2015-02-27: qty 5

## 2015-02-27 MED ORDER — CEFAZOLIN SODIUM-DEXTROSE 2-3 GM-% IV SOLR
2.0000 g | INTRAVENOUS | Status: AC
  Administered 2015-02-27: 2 g via INTRAVENOUS

## 2015-02-27 MED ORDER — MIDAZOLAM HCL 5 MG/5ML IJ SOLN
INTRAMUSCULAR | Status: DC | PRN
  Administered 2015-02-27: 2 mg via INTRAVENOUS

## 2015-02-27 MED ORDER — LACTATED RINGERS IV SOLN
INTRAVENOUS | Status: DC
  Administered 2015-02-27 (×2): via INTRAVENOUS

## 2015-02-27 MED ORDER — KETOROLAC TROMETHAMINE 30 MG/ML IJ SOLN
INTRAMUSCULAR | Status: DC | PRN
  Administered 2015-02-27: 30 mg via INTRAVENOUS

## 2015-02-27 MED ORDER — HYDROCODONE-ACETAMINOPHEN 7.5-325 MG PO TABS
ORAL_TABLET | ORAL | Status: AC
  Administered 2015-02-27: 1
  Filled 2015-02-27: qty 1

## 2015-02-27 MED ORDER — PROPOFOL 10 MG/ML IV BOLUS
INTRAVENOUS | Status: DC | PRN
  Administered 2015-02-27: 180 mg via INTRAVENOUS

## 2015-02-27 MED ORDER — HYDROCODONE-IBUPROFEN 7.5-200 MG PO TABS
1.0000 | ORAL_TABLET | Freq: Four times a day (QID) | ORAL | Status: DC | PRN
Start: 2015-02-27 — End: 2015-07-02

## 2015-02-27 MED ORDER — MIDAZOLAM HCL 2 MG/2ML IJ SOLN
INTRAMUSCULAR | Status: AC
  Filled 2015-02-27: qty 2

## 2015-02-27 MED ORDER — ONDANSETRON HCL 4 MG/2ML IJ SOLN
INTRAMUSCULAR | Status: AC
  Filled 2015-02-27: qty 2

## 2015-02-27 MED ORDER — BUPIVACAINE HCL (PF) 0.25 % IJ SOLN
INTRAMUSCULAR | Status: DC | PRN
  Administered 2015-02-27: 6 mL
  Administered 2015-02-27: 4 mL

## 2015-02-27 MED ORDER — ROCURONIUM BROMIDE 100 MG/10ML IV SOLN
INTRAVENOUS | Status: AC
  Filled 2015-02-27: qty 1

## 2015-02-27 SURGICAL SUPPLY — 34 items
BARRIER ADHS 3X4 INTERCEED (GAUZE/BANDAGES/DRESSINGS) IMPLANT
BENZOIN TINCTURE PRP APPL 2/3 (GAUZE/BANDAGES/DRESSINGS) IMPLANT
CABLE HIGH FREQUENCY MONO STRZ (ELECTRODE) IMPLANT
CANISTER SUCT 3000ML (MISCELLANEOUS) ×2 IMPLANT
CLOTH BEACON ORANGE TIMEOUT ST (SAFETY) ×2 IMPLANT
DILATOR CANAL MILEX (MISCELLANEOUS) ×2 IMPLANT
DRSG COVADERM PLUS 2X2 (GAUZE/BANDAGES/DRESSINGS) ×4 IMPLANT
DRSG OPSITE POSTOP 3X4 (GAUZE/BANDAGES/DRESSINGS) ×2 IMPLANT
FORCEPS CUTTING 33CM 5MM (CUTTING FORCEPS) IMPLANT
GLOVE BIO SURGEON STRL SZ 6.5 (GLOVE) ×4 IMPLANT
GLOVE BIOGEL PI IND STRL 7.0 (GLOVE) ×1 IMPLANT
GLOVE BIOGEL PI INDICATOR 7.0 (GLOVE) ×1
GOWN STRL REUS W/TWL LRG LVL3 (GOWN DISPOSABLE) ×4 IMPLANT
LIQUID BAND (GAUZE/BANDAGES/DRESSINGS) ×2 IMPLANT
NS IRRIG 1000ML POUR BTL (IV SOLUTION) ×2 IMPLANT
PACK LAPAROSCOPY BASIN (CUSTOM PROCEDURE TRAY) ×2 IMPLANT
PAD TRENDELENBURG POSITION (MISCELLANEOUS) ×2 IMPLANT
POUCH ENDO CATCH II 15MM (MISCELLANEOUS) ×2 IMPLANT
POUCH SPECIMEN RETRIEVAL 10MM (ENDOMECHANICALS) ×2 IMPLANT
SCISSORS LAP 5X35 DISP (ENDOMECHANICALS) IMPLANT
SET IRRIG TUBING LAPAROSCOPIC (IRRIGATION / IRRIGATOR) ×2 IMPLANT
SLEEVE XCEL OPT CAN 5 100 (ENDOMECHANICALS) ×2 IMPLANT
SOLUTION ELECTROLUBE (MISCELLANEOUS) IMPLANT
STRIP CLOSURE SKIN 1/4X3 (GAUZE/BANDAGES/DRESSINGS) IMPLANT
SUT VIC AB 2-0 UR6 27 (SUTURE) ×2 IMPLANT
SUT VIC AB 4-0 PS2 18 (SUTURE) ×2 IMPLANT
SYSTEM CARTER THOMASON II (TROCAR) ×2 IMPLANT
TOWEL OR 17X24 6PK STRL BLUE (TOWEL DISPOSABLE) ×4 IMPLANT
TRAY FOLEY CATH SILVER 14FR (SET/KITS/TRAYS/PACK) ×2 IMPLANT
TROCAR BLADELESS 15MM (ENDOMECHANICALS) ×2 IMPLANT
TROCAR XCEL DIL TIP R 11M (ENDOMECHANICALS) IMPLANT
TROCAR XCEL NON-BLD 5MMX100MML (ENDOMECHANICALS) ×2 IMPLANT
WARMER LAPAROSCOPE (MISCELLANEOUS) ×2 IMPLANT
WATER STERILE IRR 1000ML POUR (IV SOLUTION) ×2 IMPLANT

## 2015-02-27 NOTE — Brief Op Note (Signed)
02/27/2015  1:15 PM  PATIENT:  Gabriela [name]  60 y.o. female  PRE-OPERATIVE DIAGNOSIS:  left adnexal mass, uterine fibroids.  POST-OPERATIVE DIAGNOSIS:  left adnexal mass, pedunculated uterine fibroids.  PROCEDURE:  Procedure(s): LAPAROSCOPIC SALPINGO OOPHORECTOMY Left, Right Salpingectomy with collection of pelvic washings (Left)  SURGEON:  Surgeon(s) and Role:    * Cobey Raineri E Yisroel Ramming, MD - Primary    * Salvadore Dom, MD - Assisting  PHYSICIAN ASSISTANT: NA  ASSISTANTS:   Salvadore Dom, MD  ANESTHESIA:   local and general  EBL:  Total I/O In: 1500 [I.V.:1500] Out: 175 [Urine:150; Blood:25]  BLOOD ADMINISTERED:none  DRAINS: none   LOCAL MEDICATIONS USED:  MARCAINE     SPECIMEN:  Source of Specimen:  Left ovary, bilateral tubes, pelvic washings, left ovarian cyst fluid.  DISPOSITION OF SPECIMEN:  PATHOLOGY  COUNTS:  YES  TOURNIQUET:  * No tourniquets in log *  DICTATION: .Other Dictation: Dictation Number    PLAN OF CARE: Discharge to home after PACU  PATIENT DISPOSITION:  PACU - hemodynamically stable.   Delay start of Pharmacological VTE agent (>24hrs) due to surgical blood loss or risk of bleeding: not applicable

## 2015-02-27 NOTE — Anesthesia Postprocedure Evaluation (Signed)
Anesthesia Post Note  Patient: Gabriela [name]  Procedure(s) Performed: Procedure(s) (LRB): LAPAROSCOPIC SALPINGO OOPHORECTOMY Left, Right Salpingectomy with collection of pelvic washings (Left)  Patient location during evaluation: PACU Anesthesia Type: General Level of consciousness: awake and alert Pain management: pain level controlled Vital Signs Assessment: post-procedure vital signs reviewed and stable Respiratory status: spontaneous breathing, nonlabored ventilation, respiratory function stable and patient connected to nasal cannula oxygen Cardiovascular status: blood pressure returned to baseline and stable Postop Assessment: no signs of nausea or vomiting Anesthetic complications: no    Last Vitals:  Filed Vitals:   02/27/15 1400 02/27/15 1425  BP: 107/70 127/78  Pulse: 68   Temp:  37.2 C  Resp:      Last Pain: There were no vitals filed for this visit.               Montez Hageman

## 2015-02-27 NOTE — Transfer of Care (Signed)
Immediate Anesthesia Transfer of Care Note  Patient: Gabriela [name]  Procedure(s) Performed: Procedure(s): LAPAROSCOPIC SALPINGO OOPHORECTOMY Left, Right Salpingectomy with collection of pelvic washings (Left)  Patient Location: PACU  Anesthesia Type:General  Level of Consciousness: awake, alert  and oriented  Airway & Oxygen Therapy: Patient Spontanous Breathing and Patient connected to nasal cannula oxygen  Post-op Assessment: Report given to RN  Post vital signs: Reviewed  Last Vitals:  Filed Vitals:   02/27/15 1330 02/27/15 1345  BP: 110/70 111/68  Pulse: 67   Temp:    Resp: 18 18    Complications: No apparent anesthesia complications

## 2015-02-27 NOTE — Anesthesia Procedure Notes (Signed)
Procedure Name: Intubation Date/Time: 02/27/2015 11:30 AM Performed by: Talbot Grumbling Pre-anesthesia Checklist: Patient identified, Emergency Drugs available, Suction available and Patient being monitored Patient Re-evaluated:Patient Re-evaluated prior to inductionOxygen Delivery Method: Circle system utilized Preoxygenation: Pre-oxygenation with 100% oxygen Intubation Type: IV induction Ventilation: Mask ventilation without difficulty Laryngoscope Size: Mac and 3 Grade View: Grade II Tube type: Oral Tube size: 7.0 mm Number of attempts: 1 Airway Equipment and Method: Stylet Placement Confirmation: ETT inserted through vocal cords under direct vision,  positive ETCO2 and breath sounds checked- equal and bilateral Secured at: 21 cm Tube secured with: Tape Dental Injury: Teeth and Oropharynx as per pre-operative assessment

## 2015-02-27 NOTE — Anesthesia Preprocedure Evaluation (Addendum)
Anesthesia Evaluation  Patient identified by MRN, date of birth, ID band Patient awake    Reviewed: Allergy & Precautions, H&P , NPO status , Patient's Chart, lab work & pertinent test results  Airway Mallampati: I  TM Distance: >3 FB Neck ROM: full    Dental no notable dental hx. (+) Teeth Intact   Pulmonary neg pulmonary ROS,    Pulmonary exam normal        Cardiovascular negative cardio ROS Normal cardiovascular exam     Neuro/Psych    GI/Hepatic negative GI ROS, Neg liver ROS,   Endo/Other  negative endocrine ROS  Renal/GU negative Renal ROS     Musculoskeletal   Abdominal Normal abdominal exam  (+)   Peds  Hematology negative hematology ROS (+)   Anesthesia Other Findings   Reproductive/Obstetrics negative OB ROS                             Anesthesia Physical Anesthesia Plan  ASA: II  Anesthesia Plan: General   Post-op Pain Management:    Induction: Intravenous  Airway Management Planned: Oral ETT  Additional Equipment:   Intra-op Plan:   Post-operative Plan: Extubation in OR  Informed Consent: I have reviewed the patients History and Physical, chart, labs and discussed the procedure including the risks, benefits and alternatives for the proposed anesthesia with the patient or authorized representative who has indicated his/her understanding and acceptance.   Dental Advisory Given and Dental advisory given  Plan Discussed with: CRNA  Anesthesia Plan Comments:         Anesthesia Quick Evaluation

## 2015-02-27 NOTE — Progress Notes (Signed)
Update to History and Physical   No marked change in status.  Still with pain to left of umbilicus.  Patient examined.  T 99.3.  OK to proceed with surgery.

## 2015-02-27 NOTE — OR Nursing (Signed)
Called pt. Friend Tammy in surgical waiting room to let her know that surgery went well and dr. Quincy Simmonds will be out to talk to her shortly

## 2015-02-27 NOTE — Discharge Instructions (Signed)
° ° °  Gabriela [name],  I was able to remove the left tube and ovary and the right tube without any difficulty.  Nothing looked cancerous.  You had 2 fibroids sitting on stalks on the top of your uterus on the right.  These were not removed.  You had some scar tissue in the left upper abdomen and in the right mid abdomen, neither of which looked like it could be the cause of your pain. The scar tissue was not removed.  This looks like it was there for a long time.   In summary, you did very well!  Gabriela Half, MD   Diagnostic Laparoscopy A diagnostic laparoscopy is a procedure to diagnose diseases in the abdomen. During the procedure, a thin, lighted, pencil-sized instrument called a laparoscope is inserted into the abdomen through an incision. The laparoscope allows your health care provider to look at the organs inside your body. LET [facility] CARE PROVIDER KNOW ABOUT:  Any allergies you have.  All medicines you are taking, including vitamins, herbs, eye drops, creams, and over-the-counter medicines.  Previous problems you or members of your family have had with the use of anesthetics.  Any blood disorders you have.  Previous surgeries you have had.  Medical conditions you have. RISKS AND COMPLICATIONS  Generally, this is a safe procedure. However, problems can occur, which may include:  Infection.  Bleeding.  Damage to other organs.  Allergic reaction to the anesthetics used during the procedure. BEFORE THE PROCEDURE  Do not eat or drink anything after midnight on the night before the procedure or as directed by your health care provider.  Ask your health care provider about:  Changing or stopping your regular medicines.  Taking medicines such as aspirin and ibuprofen. These medicines can thin your blood. Do not take these medicines before your procedure if your health care provider instructs you not to.  Plan to have someone take you home after the  procedure. PROCEDURE  You may be given a medicine to help you relax (sedative).  You will be given a medicine to make you sleep (general anesthetic).  Your abdomen will be inflated with a gas. This will make your organs easier to see.  Small incisions will be made in your abdomen.  A laparoscope and other small instruments will be inserted into the abdomen through the incisions.  A tissue sample may be removed from an organ in the abdomen for examination.  The instruments will be removed from the abdomen.  The gas will be released.  The incisions will be closed with stitches (sutures). AFTER THE PROCEDURE  Your blood pressure, heart rate, breathing rate, and blood oxygen level will be monitored often until the medicines you were given have worn off.   This information is not intended to replace advice given to you by your health care provider. Make sure you discuss any questions you have with your health care provider.   Document Released: 05/05/2000 Document Revised: 10/18/2014 Document Reviewed: 09/09/2013 Elsevier Interactive Patient Education [employer].

## 2015-02-28 ENCOUNTER — Encounter (HOSPITAL_COMMUNITY): Payer: Self-pay | Admitting: Obstetrics and Gynecology

## 2015-02-28 NOTE — Op Note (Signed)
Gabriela [name], Gabriela [name]          ACCOUNT NO.:  [id number]  MEDICAL RECORD NO.:  [id number]  LOCATION:  [facility]                          FACILITY:  [facility]  PHYSICIAN:  Lenard Galloway, M.D.   DATE OF BIRTH:  1955-05-20  DATE OF PROCEDURE:  02/27/2015 DATE OF DISCHARGE:                              OPERATIVE REPORT   PREOPERATIVE DIAGNOSIS:  Left ovarian cyst, uterine fibroids.  POSTOPERATIVE DIAGNOSIS:  Left ovarian cyst, pedunculated uterine fibroids.  PROCEDURE:  Laparoscopy with left salpingo-oophorectomy, right salpingectomy, collection of pelvic washings.  SURGEON:  Lenard Galloway, M.D.  ASSISTANT:  Sumner Boast, MD.  ANESTHESIA:  General endotracheal, local with 0.25% Marcaine.  IV FLUIDS:  1500 mL Ringer's lactate.  EBL:  25 mL.  URINE OUTPUT:  150 mL.  COMPLICATIONS:  None.  INDICATIONS FOR THE PROCEDURE:  The patient is a 60 year old, gravida 2, para 0-0-2-0, Caucasian female, who presented with a known simple left ovarian cyst which was diagnosed initially from an MRI ordered to evaluate spinal disk disease.  The patient had serial pelvic ultrasounds through another provider which documented an increasing size of a simple left ovarian cyst.  The cyst measured approximately 8 cm in the largest diameter.  There were no internal septations, internal vascularity, or solid mural components noted.  The patient was also noted to have 3 fibroids in the right fundal region measuring 1.4 cm, 2.7 cm, and 3.0 cm.  The right ovary was not visualized well.  CA-125 on December 12, 2014, measured 32.  A plan is now made to proceed with a laparoscopy with left salpingo-oophorectomy, right salpingectomy, and collection of pelvic washings.  The patient declines hysterectomy and removal of the right ovary, unless the ovary is diseased.  Risks, benefits, and alternatives are reviewed with the patient, who wishes to proceed.  FINDINGS:  Exam under anesthesia revealed a large left  adnexal mass which felt like it filled the cul-de-sac and measured 8-10 cm in diameter.  The cyst felt heavy on pelvic and rectovaginal exam.  At the time of the surgery, the patient was noted to have a 15 cm left ovarian cyst.  The cyst appeared to have a very thin wall to it.  The bilateral fallopian tubes and the right ovary were unremarkable.  There was a 3.5 cm pedunculated right fundal fibroid, which was noted to have a stalk of approximately 2 cm and some thickness to it.  Peritoneal surfaces and the abdominal peritoneal cavity demonstrated no evidence of any old endometriosis.  The appendix was unremarkable. There were some adhesions in the left upper quadrant which were quite filmy in nature, and involving omentum versus adipose tissue off the bowel.  There were also adhesions between the cecum and the right abdominal sidewall.  The liver and gallbladder appeared to be unremarkable.  SPECIMEN:  The left ovary and bilateral fallopian tubes were sent to Pathology.  Left ovarian cyst fluid was also sent to Pathology.  Pelvic washings were obtained and similarly sent for pathologic analysis.  PROCEDURE IN DETAIL:  The patient was reidentified in the preoperative hold area.  She did receive Ancef 2 g IV for antibiotic prophylaxis. She received both TED hose and PAS stockings for  DVT prophylaxis.  In the operating room, the patient was placed in the dorsal lithotomy position with Allen stirrups and her arms padded and resting at her sides.  General endotracheal anesthesia was induced.  The patient's abdomen and vagina were then sterilely prepped, and she was sterilely draped.  A Foley catheter had been previously placed sterilely inside the bladder.  A speculum had been placed inside the vagina and a single- tooth tenaculum on the anterior cervical lip.  The cervical stenosis was encountered, and the Hulka tenaculum was therefore not placed, and a sponge on a stick was placed  inside the vagina for the procedure and then removed at termination of the operation.  Attention was turned to the abdomen where the infraumbilical region was injected locally with 0.25% Marcaine.  A 1 cm transverse infraumbilical incision was then created with a scalpel.  Dissection down to the fascia was performed with an Allis clamp.  A 10-mm trocar was inserted directly into the peritoneal cavity at this time and the laparoscope confirmed proper placement.  CO2 pneumoperitoneum was achieved, and the patient was placed in the Trendelenburg position.  An exploration of the pelvic and abdominal organs was performed.  5 mm trocars were then placed in the right and left lower quadrants after the skin was injected locally with 0.25% Marcaine and the incisions created with a scalpel.  These trocars were placed under direct visualization of the laparoscope.  Further exploration of the pelvic anatomy was performed at this time.  A Gyrus instrument was used at this time to perform the left salpingo- oophorectomy.  The left infundibulopelvic ligament was identified.  It was triply cauterized and then cut.  Dissection continued through the broad ligament on the patient's left-hand side again using the Gyrus for coagulation and cautery.  The proximal left fallopian tube was then cauterized and bisected using the same instrument.  The left utero- ovarian ligament was cauterized and bisected also using the Gyrus instrument.  The specimen was freed at this time and was set in the posterior cul-de-sac.  Hemostasis was good.  The right fallopian tube was identified at this time.  The right ureter was identified.  The distal fallopian tube was grasped and the Gyrus instrument was used to cauterize the distal portion of the fallopian tube where it was adherent to the ovary.  The Gyrus instrument was used to perform the salpingectomy on this right hand side.  The gyrus came through the mesosalpinx  using cautery and cutting, and then the proximal right fallopian tube was similarly cauterized and cut, and the specimen was set inside the posterior cul-de-sac.  The 10 mm laparoscope was removed from the umbilical trocar at this time and a 5 mm laparoscope was placed in the right lower quadrant incision.  A 10 mm EndoCatch bag was then placed inside the umbilical port and down into the pelvis, and it was clear that the left ovary would not fit in this bag.  The EndoCatch bag was removed as was the 10 mm umbilical trocar, and the umbilical incision was extended to accommodate a 15 mm reusable trocar which was placed under visualization of the 5 mm laparoscope.  A 15 mm EndoCatch bag was then placed inside the peritoneal cavity and the left tube and ovary and the right fallopian tube were then all removed through the EndoCatch bag and brought up through the umbilical incision. At this time, the cyst was ruptured in a controlled fashion with a scalpel while it was  inside the EndoCatch bag and the excess fluid was suctioned out.  The remainder of the specimen was removed at this time. It was sent to Pathology.  The 15 mm trocar was once again placed inside the peritoneal cavity under visualization of the 5 mm laparoscope.  The pelvis was irrigated and suctioned.  All operative sites were hemostatic.  The left pelvic sidewall was visualized well at this time and the ureter was noted to peristalsis well.  The umbilical trocar was removed at this time.  The CO2 pneumoperitoneum was released.  The remaining laparoscopic trocars were removed.  The patient did receive manual breaths prior to removing these trocars.  The umbilical fascia was closed with a running suture of 0 Vicryl.  All skin incisions were closed with subcuticular sutures of 4-0 Vicryl. Dermabond was placed over the incisions.  A honeycomb dressing was placed over the umbilical incision.  The sponge on a stick was removed  from the vagina and the Foley catheter was removed from the bladder.  This concluded the patient's procedure. There were no complications.  All needle, instrument, and sponge counts were correct.  She has escorted to the recovery room in stable and awake condition.     Lenard Galloway, M.D.     BES/MEDQ  D:  02/27/2015  T:  02/27/2015  Job:  [id number]

## 2015-03-02 ENCOUNTER — Telehealth: Payer: Self-pay | Admitting: Emergency Medicine

## 2015-03-02 NOTE — Telephone Encounter (Signed)
Patient returned call. She is given message from Dr. Quincy Simmonds.  Verbalized understanding of results.  Patient states that honeycomb dressing is still in place and she would like to shower. Advised honey comb dressing can be removed carefully. Advised patient to not manipulate any glue that is holding skin together. Patient verbalized understanding. She will follow up for post op on Monday. Routing to provider for final review. Patient agreeable to disposition. Will close encounter.

## 2015-03-02 NOTE — Telephone Encounter (Signed)
Message left to return call to Terie Lear at [phone].    

## 2015-03-02 NOTE — Telephone Encounter (Signed)
-----   Message from Nunzio Cobbs, MD sent at 03/01/2015  9:31 PM EST ----- Please inform patient of all benign surgical pathology reports.  The tubes, left ovary, and pelvic washings were benign.  I hope she is recovering well from surgery!

## 2015-03-05 ENCOUNTER — Ambulatory Visit (INDEPENDENT_AMBULATORY_CARE_PROVIDER_SITE_OTHER): Payer: BLUE CROSS/BLUE SHIELD | Admitting: Obstetrics and Gynecology

## 2015-03-05 ENCOUNTER — Encounter: Payer: Self-pay | Admitting: Obstetrics and Gynecology

## 2015-03-05 VITALS — BP 130/76 | HR 80 | Ht 65.5 in | Wt 165.6 lb

## 2015-03-05 DIAGNOSIS — Z9889 Other specified postprocedural states: Secondary | ICD-10-CM
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
## 2015-03-20 ENCOUNTER — Telehealth: Payer: Self-pay | Admitting: Obstetrics and Gynecology

## 2015-03-20 NOTE — Telephone Encounter (Signed)
Spoke with patient. Patient declines to provide me with any information. She would like to speak with Gay Filler directly. Advised I will let Gay Filler know so that call may be returned. Patient is agreeable.

## 2015-03-20 NOTE — Telephone Encounter (Signed)
Return call to patient. States she is concerned about post op appointment scheduled for next week.  She is scheduled for return to work date of 03-27-15 and she has labor intensive job with heavy lifting. Does not feel she is recovering enough that she will be ready to return to work next week. She does not want to wait till the last day of STD to find this out and then notify employer at last minute and cause undue hardship for them.  Post op appointment moved up to 03-21-15 per patient request. She states she knows her body and feels she is not yet ready to go back to such a physically demanding job. Would like to take full 6 weeks of disability.  Wants Dr Quincy Simmonds to know how she is feeling before she arrives. Advised Dr Quincy Simmonds will review call.  Routing to provider for final review. Patient agreeable to disposition. Will close encounter.

## 2015-03-20 NOTE — Telephone Encounter (Signed)
Patient is asking to talk with Gay Filler regarding an appointment.  Patient did not give an further details. No open phone note.

## 2015-03-21 ENCOUNTER — Ambulatory Visit: Payer: BLUE CROSS/BLUE SHIELD | Admitting: Obstetrics and Gynecology

## 2015-03-22 ENCOUNTER — Encounter: Payer: Self-pay | Admitting: Obstetrics and Gynecology

## 2015-03-22 ENCOUNTER — Ambulatory Visit (INDEPENDENT_AMBULATORY_CARE_PROVIDER_SITE_OTHER): Payer: BLUE CROSS/BLUE SHIELD | Admitting: Obstetrics and Gynecology

## 2015-03-22 VITALS — BP 150/88 | HR 90 | Ht 65.5 in | Wt 170.6 lb

## 2015-03-22 DIAGNOSIS — Z9889 Other specified postprocedural states: Secondary | ICD-10-CM
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
## 2015-03-26 ENCOUNTER — Ambulatory Visit: Payer: BLUE CROSS/BLUE SHIELD | Admitting: Obstetrics and Gynecology

## 2015-04-12 ENCOUNTER — Telehealth: Payer: Self-pay | Admitting: Obstetrics and Gynecology

## 2015-04-12 NOTE — Telephone Encounter (Signed)
Patient was released to return to work on feb 28th and she need a return to work note faxed to her employer. Fax number is [phone].

## 2015-04-12 NOTE — Telephone Encounter (Signed)
Spoke with patient. Advised return to work letter has been completed and is ready for fax. Verified fax number as [phone].   Letter faxed with cover sheet and confirmation.  Routing to provider for final review. Patient agreeable to disposition. Will close encounter.

## 2015-04-12 NOTE — Telephone Encounter (Signed)
Return to work letter written. Letter to Dr.Silva for review and signature prior to sending.

## 2015-04-30 ENCOUNTER — Ambulatory Visit (INDEPENDENT_AMBULATORY_CARE_PROVIDER_SITE_OTHER): Payer: BLUE CROSS/BLUE SHIELD | Admitting: Physician Assistant

## 2015-04-30 VITALS — BP 118/84 | HR 86 | Temp 98.7°F | Resp 17 | Ht 66.0 in | Wt 172.0 lb

## 2015-04-30 DIAGNOSIS — Z9109 Other allergy status, other than to drugs and biological substances: Secondary | ICD-10-CM | POA: Diagnosis not present

## 2015-04-30 DIAGNOSIS — J329 Chronic sinusitis, unspecified: Secondary | ICD-10-CM | POA: Diagnosis not present

## 2015-04-30 DIAGNOSIS — R070 Pain in throat: Secondary | ICD-10-CM

## 2015-04-30 DIAGNOSIS — Z889 Allergy status to unspecified drugs, medicaments and biological substances status: Secondary | ICD-10-CM

## 2015-04-30 LAB — POCT RAPID STREP A (OFFICE): RAPID STREP A SCREEN: NEGATIVE

## 2015-04-30 MED ORDER — LORATADINE 10 MG PO TABS: 10.0000 mg | ORAL_TABLET | Freq: Every day | ORAL | Status: DC

## 2015-04-30 MED ORDER — GUAIFENESIN ER 1200 MG PO TB12: 1.0000 | ORAL_TABLET | Freq: Two times a day (BID) | ORAL | Status: DC | PRN

## 2015-04-30 MED ORDER — AMOXICILLIN-POT CLAVULANATE 875-125 MG PO TABS: 1.0000 | ORAL_TABLET | Freq: Two times a day (BID) | ORAL | Status: AC
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
## 2015-04-30 NOTE — Patient Instructions (Addendum)
     IF you received an x-ray today, you will receive an invoice from [facility]. Please contact [facility] at [phone] with questions or concerns regarding your invoice.   IF you received labwork today, you will receive an invoice from [employer]. Please contact [facility] at [phone] with questions or concerns regarding your invoice.   Our billing staff will not be able to assist you with questions regarding bills from these companies.  You will be contacted with the lab results as soon as they are available. The fastest way to get your results is to activate your My Chart account. Instructions are located on the last page of this paperwork. If you have not heard from [place] regarding the results in 2 weeks, please contact this office.    Please stop the zyrtec, and we will start the claritin at this time. Mucinex 1200mg  every 12 hours. You will attempt this for 1 week.  If the symptoms, do not improve or worsen start the antibiotic. Make sure that you are hydrating very well with 64 oz or more of water.   [place]

## 2015-05-02 LAB — CULTURE, GROUP A STREP: ORGANISM ID, BACTERIA: NORMAL

## 2015-05-06 ENCOUNTER — Other Ambulatory Visit: Payer: Self-pay | Admitting: Physician Assistant

## 2015-05-07 ENCOUNTER — Other Ambulatory Visit: Payer: Self-pay

## 2015-05-07 DIAGNOSIS — Z0001 Encounter for general adult medical examination with abnormal findings: Secondary | ICD-10-CM

## 2015-05-07 DIAGNOSIS — F419 Anxiety disorder, unspecified: Secondary | ICD-10-CM

## 2015-05-07 MED ORDER — DIAZEPAM 5 MG PO TABS: 5.0000 mg | ORAL_TABLET | Freq: Two times a day (BID) | ORAL | Status: DC | PRN

## 2015-05-07 NOTE — Telephone Encounter (Signed)
RX CALLED INTO [facility] 

## 2015-05-27 ENCOUNTER — Other Ambulatory Visit: Payer: Self-pay | Admitting: Internal Medicine

## 2015-06-29 ENCOUNTER — Other Ambulatory Visit: Payer: Self-pay | Admitting: *Deleted

## 2015-06-29 MED ORDER — GABAPENTIN 300 MG PO CAPS: 300.0000 mg | ORAL_CAPSULE | Freq: Three times a day (TID) | ORAL | Status: DC

## 2015-07-02 ENCOUNTER — Other Ambulatory Visit: Payer: Self-pay | Admitting: *Deleted

## 2015-07-02 MED ORDER — HYDROCODONE-IBUPROFEN 7.5-200 MG PO TABS
1.0000 | ORAL_TABLET | Freq: Four times a day (QID) | ORAL | Status: DC | PRN
Start: 2015-07-02 — End: 2015-09-27

## 2015-07-03 ENCOUNTER — Other Ambulatory Visit: Payer: Self-pay | Admitting: Internal Medicine

## 2015-07-03 NOTE — Telephone Encounter (Signed)
RX CALLED INTO [facility]. 

## 2015-08-16 ENCOUNTER — Other Ambulatory Visit: Payer: Self-pay | Admitting: Physician Assistant

## 2015-08-17 NOTE — Telephone Encounter (Signed)
RX CALLED INTO [facility].

## 2015-09-15 ENCOUNTER — Other Ambulatory Visit: Payer: Self-pay | Admitting: Physician Assistant

## 2015-09-25 ENCOUNTER — Telehealth: Payer: Self-pay | Admitting: *Deleted

## 2015-09-25 NOTE — Telephone Encounter (Signed)
Patient is in 04 recall for 08/2015- she needs a Left Breast U/S. Please contact patient Thanks Margaretha Sheffield

## 2015-09-25 NOTE — Telephone Encounter (Signed)
Please have patient contact [employer] directly.  We have had success with the radiology facility dealing with this directly with the patient as sometimes the insurance companies are denying due to use of 3D technology.

## 2015-09-25 NOTE — Telephone Encounter (Signed)
Spoke with patient about scheduling her f/u 6 month u/s of L Breast. Patient states she recently received a letter from her insurance stating they denied payment of her 02/23/15 mammogram/ultrasound. She was sent for mammo and ultrasound for continual shooting pain in L Breast. Patient would like to know if she can get a letter to help cover this payment. Please advise. Thank you.

## 2015-09-26 NOTE — Telephone Encounter (Signed)
Left message to call back. -sco

## 2015-09-27 ENCOUNTER — Encounter: Payer: Self-pay | Admitting: Sports Medicine

## 2015-09-27 ENCOUNTER — Ambulatory Visit (INDEPENDENT_AMBULATORY_CARE_PROVIDER_SITE_OTHER): Payer: BLUE CROSS/BLUE SHIELD | Admitting: Sports Medicine

## 2015-09-27 DIAGNOSIS — M7542 Impingement syndrome of left shoulder: Secondary | ICD-10-CM

## 2015-09-27 DIAGNOSIS — M5124 Other intervertebral disc displacement, thoracic region: Secondary | ICD-10-CM

## 2015-09-27 DIAGNOSIS — M75102 Unspecified rotator cuff tear or rupture of left shoulder, not specified as traumatic: Secondary | ICD-10-CM

## 2015-09-27 MED ORDER — HYDROCODONE-IBUPROFEN 7.5-200 MG PO TABS
1.0000 | ORAL_TABLET | Freq: Three times a day (TID) | ORAL | 0 refills | Status: DC | PRN
Start: 2015-09-27 — End: 2016-03-04

## 2015-09-27 NOTE — Assessment & Plan Note (Signed)
This is currently doing well Even with ongoing work

## 2015-09-27 NOTE — Progress Notes (Signed)
CC: Neck pain and radiculopathy and chronic shoulder issues  I have seen for a long period for chronic DDD orf cervical spine  Hx of lots of radiating pain to shoulders RT responded to some HEP and change at work  Left shoulder more recently with similar pain  1/4 vicoprofen and 2 advil about 4x day  MRI 11/08/14 Herniated disk low T11/12  Level - large central Fall 2016 - Dr Micheline Chapman Also elements of DDD and spinal stenosis in rest of lumbar disks  Sleep pattern Now getting to sleep better on antidepressant Gets up to BR 3 times Gabapentin 600 and 1 Diazepam 5  Past Hx No longer needing Metformin Depression - using Cymbalta  ROS No progressive weakness No curent numbness in extremities  PE Pleasant w mood positive, NAD BP 122/87   Pulse 79   Ht 5\' 6"  (1.676 m)   Wt 166 lb (75.3 kg)   LMP 02/10/2009 (Approximate)   BMI 26.79 kg/m   RC testing is good exc strength No + impingement signs  Neck motion is pain free   Lumbar spine w no TTP Able to walk heel and toe

## 2015-09-27 NOTE — Telephone Encounter (Signed)
Patient going to contact [employer] and try to get things straightened out with them and the insurance company. Patient stated she will schedule f/u as soon as this is taken care of since she doesn't want to be charged for another mammogram.

## 2015-10-03 NOTE — Telephone Encounter (Signed)
Extended recall to give patient time to sort out payment and get scheduled-eh

## 2015-10-05 ENCOUNTER — Ambulatory Visit (INDEPENDENT_AMBULATORY_CARE_PROVIDER_SITE_OTHER): Payer: BLUE CROSS/BLUE SHIELD | Admitting: Physician Assistant

## 2015-10-05 VITALS — BP 110/70 | HR 80 | Temp 98.6°F | Resp 16 | Ht 65.75 in | Wt 169.6 lb

## 2015-10-05 DIAGNOSIS — H65191 Other acute nonsuppurative otitis media, right ear: Secondary | ICD-10-CM | POA: Diagnosis not present

## 2015-10-05 DIAGNOSIS — R070 Pain in throat: Secondary | ICD-10-CM | POA: Diagnosis not present

## 2015-10-05 DIAGNOSIS — J302 Other seasonal allergic rhinitis: Secondary | ICD-10-CM

## 2015-10-05 MED ORDER — AMOXICILLIN 500 MG PO CAPS: 500.0000 mg | ORAL_CAPSULE | Freq: Three times a day (TID) | ORAL | 0 refills | Status: DC

## 2015-10-05 MED ORDER — RANITIDINE HCL 150 MG PO TABS: 150.0000 mg | ORAL_TABLET | Freq: Two times a day (BID) | ORAL | 2 refills | Status: DC

## 2015-10-05 NOTE — Progress Notes (Signed)
Gabriela [name]  MRN: [id number] DOB: 1955/07/07  PCP: Alesia Richards, MD  Subjective:  Pt is a 60 year old female, history of seasonal allergies, presents to clinic for right ear pain x 1 day.  She has been experiencing uncontrolled allergies for the past 6 weeks with post nasal drip, cough, runny nose. Today she experienced "shooting pains" in her right ear. Radiates to the back of her head. She feels like she cannot clear her ears.  Is taking Claritin and nasocort, with minor relief.    - She describes throat pain with a raspy voice. She had surgery 02/2015 and has had this pain and raspy voice since that time. She feels her throat is hurting more since her symptoms started.    Review of Systems  Constitutional: Negative.   HENT: Positive for congestion, ear pain (right), postnasal drip, rhinorrhea, sinus pressure, sore throat and voice change.   Respiratory: Positive for cough. Negative for shortness of breath and wheezing.   Cardiovascular: Negative.   Gastrointestinal: Negative.   Allergic/Immunologic: Positive for environmental allergies. Negative for food allergies.  Neurological: Negative.     Patient Active Problem List   Diagnosis Date Noted  . Ovarian cyst 12/12/2014  . Thoracic disc herniation 11/06/2014  . Pectoralis muscle strain 04/26/2014  . Prediabetes 04/18/2014  . Fatigue 04/18/2014  . Rotator cuff impingement syndrome of left shoulder 12/06/2013  . Biceps tendinopathy 12/06/2013  . Allergy   . Anxiety   . Depression   . Insomnia   . Sprain of costochondral joint 09/14/2012  . Neck pain on right side 12/05/2011  . Rotator cuff impingement syndrome 12/05/2011  . Groin discomfort 06/30/2011  . Major depressive disorder, recurrent ([place]) 03/10/2011    Class: Acute  . Adjustment disorder with mixed anxiety and depressed mood 09/12/2010  . Brachial neuritis or radiculitis 08/21/2009  . WRIST PAIN, LEFT 06/28/2008  . PES ANSERINUS TENDINITIS OR  BURSITIS 12/07/2007  . LUMBOSACRAL STRAIN, ACUTE 03/31/2007    Current Outpatient Prescriptions on File Prior to Visit  Medication Sig Dispense Refill  . Cetirizine HCl (ZYRTEC ALLERGY PO) Take 1 tablet by mouth daily. Reported on 04/30/2015    . Cholecalciferol (VITAMIN D3) 5000 units CAPS Take 1 capsule by mouth daily.    . cyclobenzaprine (FLEXERIL) 5 MG tablet TAKE 1/2 TO 1 TABLET BY MOUTH EVERY NIGHT AT BEDTIME (Patient taking differently: Take 2.5-5 mg by mouth daily as needed for muscle spasms. ) 60 tablet 1  . diazepam (VALIUM) 5 MG tablet TAKE 1 TABLET BY MOUTH EVERY 12 HOURS AS NEEDED FOR ANXIETY 60 tablet 0  . diphenhydrAMINE (BENADRYL) 25 mg capsule Take 25 mg by mouth every 6 (six) hours as needed.    . DULoxetine (CYMBALTA) 30 MG capsule TAKE 1 CAPSULE(30 MG) BY MOUTH DAILY 90 capsule 1  . EPINEPHrine (EPIPEN 2-PAK) 0.3 mg/0.3 mL DEVI Inject 0.3 mLs (0.3 mg total) into the muscle as needed (anaphylaxis). 1 Device 0  . gabapentin (NEURONTIN) 300 MG capsule Take 1 capsule (300 mg total) by mouth 3 (three) times daily. 90 capsule 3  . Guaifenesin (MUCINEX MAXIMUM STRENGTH) 1200 MG TB12 Take 1 tablet (1,200 mg total) by mouth every 12 (twelve) hours as needed. 14 tablet 1  . HYDROcodone-ibuprofen (VICOPROFEN) 7.5-200 MG tablet Take 1 tablet by mouth 3 (three) times daily as needed for moderate pain or severe pain. 90 tablet 0  . loratadine (CLARITIN) 10 MG tablet Take 1 tablet (10 mg total) by mouth daily. 30 tablet 4  .  metFORMIN (GLUCOPHAGE XR) 500 MG 24 hr tablet Take 1 tablet (500 mg total) by mouth 2 (two) times daily. 60 tablet 5  . ranitidine (ZANTAC) 150 MG tablet Take 150 mg by mouth 2 (two) times daily.    Marland Kitchen triamcinolone (NASACORT AQ) 55 MCG/ACT AERO nasal inhaler Place 2 sprays into the nose daily. 1 Inhaler 12   No current facility-administered medications on file prior to visit.     Allergies  Allergen Reactions  . Acetaminophen Nausea Only  . Prednisone Other (See  Comments)    Gets very manic    Objective:  BP 110/70 (BP Location: Right Arm, Patient Position: Sitting, Cuff Size: Normal)   Pulse 80   Temp 98.6 F (37 C) (Oral)   Resp 16   Ht 5' 5.75" (1.67 m)   Wt 169 lb 9.6 oz (76.9 kg)   LMP 02/10/2009 (Approximate)   SpO2 97%   BMI 27.58 kg/m   Physical Exam  Constitutional: She is oriented to person, place, and time and well-developed, well-nourished, and in no distress. No distress.  HENT:  Right Ear: Ear canal normal. Tympanic membrane is injected and bulging. Tympanic membrane is not perforated.  Left Ear: Tympanic membrane and ear canal normal.  Mouth/Throat: Posterior oropharyngeal edema and posterior oropharyngeal erythema present.  Eyes: Conjunctivae are normal. Pupils are equal, round, and reactive to light.  Neck: Normal range of motion. Neck supple.  Cardiovascular: Normal rate, regular rhythm and normal heart sounds.   Pulmonary/Chest: Effort normal and breath sounds normal. No respiratory distress. She has no wheezes.  Lymphadenopathy:    She has no cervical adenopathy.  Neurological: She is alert and oriented to person, place, and time. GCS score is 15.  Skin: Skin is warm and dry.  Psychiatric: Mood, memory, affect and judgment normal.  Vitals reviewed.   Assessment and Plan :  1. Acute nonsuppurative otitis media of right ear - amoxicillin (AMOXIL) 500 MG capsule; Take 1 capsule (500 mg total) by mouth 3 (three) times daily.  Dispense: 30 capsule; Refill: 0  2. Seasonal allergies - ranitidine (ZANTAC) 150 MG tablet; Take 1 tablet (150 mg total) by mouth 2 (two) times daily.  Dispense: 60 tablet; Refill: 2  3. Throat pain - Ambulatory referral to ENT  - Patient instructed to RTC if symptoms do not improve.    Mercer Pod, PA-C  [facility] 10/05/2015 2:51 PM

## 2015-10-05 NOTE — Patient Instructions (Addendum)
Take Zantac and Claritin together.  Start nasal spray as directed for at least one week.  Continue netti pot    IF you received an x-ray today, you will receive an invoice from [facility]. Please contact [facility] at [phone] with questions or concerns regarding your invoice.   IF you received labwork today, you will receive an invoice from [employer]. Please contact [facility] at [phone] with questions or concerns regarding your invoice.   Our billing staff will not be able to assist you with questions regarding bills from these companies.  You will be contacted with the lab results as soon as they are available. The fastest way to get your results is to activate your My Chart account. Instructions are located on the last page of this paperwork. If you have not heard from [place] regarding the results in 2 weeks, please contact this office.     o Otitis Media, Adult Otitis media is redness, soreness, and inflammation of the middle ear. Otitis media may be caused by allergies or, most commonly, by infection. Often it occurs as a complication of the common cold. SIGNS AND SYMPTOMS Symptoms of otitis media may include:  Earache.  Fever.  Ringing in your ear.  Headache.  Leakage of fluid from the ear. DIAGNOSIS To diagnose otitis media, your health care provider will examine your ear with an otoscope. This is an instrument that allows your health care provider to see into your ear in order to examine your eardrum. Your health care provider also will ask you questions about your symptoms. TREATMENT  Typically, otitis media resolves on its own within 3-5 days. Your health care provider may prescribe medicine to ease your symptoms of pain. If otitis media does not resolve within 5 days or is recurrent, your health care provider may prescribe antibiotic medicines if he or she suspects that a bacterial infection is the cause. HOME CARE  INSTRUCTIONS   If you were prescribed an antibiotic medicine, finish it all even if you start to feel better.  Take medicines only as directed by your health care provider.  Keep all follow-up visits as directed by your health care provider. SEEK MEDICAL CARE IF:  You have otitis media only in one ear, or bleeding from your nose, or both.  You notice a lump on your neck.  You are not getting better in 3-5 days.  You feel worse instead of better. SEEK IMMEDIATE MEDICAL CARE IF:   You have pain that is not controlled with medicine.  You have swelling, redness, or pain around your ear or stiffness in your neck.  You notice that part of your face is paralyzed.  You notice that the bone behind your ear (mastoid) is tender when you touch it. MAKE SURE YOU:   Understand these instructions.  Will watch your condition.  Will get help right away if you are not doing well or get worse.   This information is not intended to replace advice given to you by your health care provider. Make sure you discuss any questions you have with your health care provider.   Document Released: 11/02/2003 Document Revised: 02/17/2014 Document Reviewed: 08/24/2012 Elsevier Interactive Patient Education [employer].

## 2015-10-09 ENCOUNTER — Other Ambulatory Visit: Payer: Self-pay | Admitting: Physician Assistant

## 2015-10-09 ENCOUNTER — Ambulatory Visit: Payer: BLUE CROSS/BLUE SHIELD | Admitting: Sports Medicine

## 2015-10-09 NOTE — Telephone Encounter (Signed)
Rx called into [facility].

## 2015-10-09 NOTE — Telephone Encounter (Signed)
LMTCB to see if patient has been able to get in touch with [facility] and schedule an appointment yet. -sco

## 2015-10-19 DIAGNOSIS — R490 Dysphonia: Secondary | ICD-10-CM | POA: Diagnosis not present

## 2015-10-19 DIAGNOSIS — R05 Cough: Secondary | ICD-10-CM | POA: Diagnosis not present

## 2015-10-19 DIAGNOSIS — K219 Gastro-esophageal reflux disease without esophagitis: Secondary | ICD-10-CM | POA: Diagnosis not present

## 2015-10-26 ENCOUNTER — Other Ambulatory Visit: Payer: Self-pay | Admitting: Physician Assistant

## 2015-10-26 ENCOUNTER — Other Ambulatory Visit: Payer: BLUE CROSS/BLUE SHIELD

## 2015-10-26 DIAGNOSIS — Z0001 Encounter for general adult medical examination with abnormal findings: Secondary | ICD-10-CM | POA: Diagnosis not present

## 2015-10-26 DIAGNOSIS — Z13220 Encounter for screening for lipoid disorders: Secondary | ICD-10-CM

## 2015-10-26 DIAGNOSIS — I10 Essential (primary) hypertension: Secondary | ICD-10-CM

## 2015-10-26 DIAGNOSIS — Z79899 Other long term (current) drug therapy: Secondary | ICD-10-CM | POA: Diagnosis not present

## 2015-10-26 DIAGNOSIS — R5383 Other fatigue: Secondary | ICD-10-CM

## 2015-10-26 DIAGNOSIS — E559 Vitamin D deficiency, unspecified: Secondary | ICD-10-CM

## 2015-10-26 DIAGNOSIS — R7303 Prediabetes: Secondary | ICD-10-CM

## 2015-10-26 LAB — CBC WITH DIFFERENTIAL/PLATELET
BASOS PCT: 1 %
Basophils Absolute: 64 cells/uL (ref 0–200)
EOS PCT: 9 %
Eosinophils Absolute: 576 cells/uL — ABNORMAL HIGH (ref 15–500)
HCT: 39.9 % (ref 35.0–45.0)
Hemoglobin: 13.1 g/dL (ref 11.7–15.5)
LYMPHS ABS: 1792 {cells}/uL (ref 850–3900)
LYMPHS PCT: 28 %
MCH: 29.6 pg (ref 27.0–33.0)
MCHC: 32.8 g/dL (ref 32.0–36.0)
MCV: 90.3 fL (ref 80.0–100.0)
MONO ABS: 576 {cells}/uL (ref 200–950)
MPV: 9.5 fL (ref 7.5–12.5)
Monocytes Relative: 9 %
NEUTROS PCT: 53 %
Neutro Abs: 3392 cells/uL (ref 1500–7800)
PLATELETS: 341 10*3/uL (ref 140–400)
RBC: 4.42 MIL/uL (ref 3.80–5.10)
RDW: 13.2 % (ref 11.0–15.0)
WBC: 6.4 10*3/uL (ref 3.8–10.8)

## 2015-10-27 LAB — URINALYSIS, ROUTINE W REFLEX MICROSCOPIC
BILIRUBIN URINE: NEGATIVE
GLUCOSE, UA: NEGATIVE
Hgb urine dipstick: NEGATIVE
KETONES UR: NEGATIVE
Leukocytes, UA: NEGATIVE
Nitrite: NEGATIVE
PH: 7.5 (ref 5.0–8.0)
Protein, ur: NEGATIVE
SPECIFIC GRAVITY, URINE: 1.01 (ref 1.001–1.035)

## 2015-10-27 LAB — HEPATIC FUNCTION PANEL
ALT: 17 U/L (ref 6–29)
AST: 20 U/L (ref 10–35)
Albumin: 4.5 g/dL (ref 3.6–5.1)
Alkaline Phosphatase: 55 U/L (ref 33–130)
BILIRUBIN DIRECT: 0.1 mg/dL (ref <=0.2)
BILIRUBIN INDIRECT: 0.4 mg/dL (ref 0.2–1.2)
TOTAL PROTEIN: 7.1 g/dL (ref 6.1–8.1)
Total Bilirubin: 0.5 mg/dL (ref 0.2–1.2)

## 2015-10-27 LAB — MAGNESIUM: Magnesium: 2.2 mg/dL (ref 1.5–2.5)

## 2015-10-27 LAB — LIPID PANEL
CHOL/HDL RATIO: 3.2 ratio (ref <=5.0)
CHOLESTEROL: 228 mg/dL — AB (ref 125–200)
HDL: 72 mg/dL (ref >=46)
LDL Cholesterol: 134 mg/dL — ABNORMAL HIGH (ref <130)
Triglycerides: 108 mg/dL (ref <150)
VLDL: 22 mg/dL (ref <30)

## 2015-10-27 LAB — HEMOGLOBIN A1C
HEMOGLOBIN A1C: 5.4 % (ref <5.7)
MEAN PLASMA GLUCOSE: 108 mg/dL

## 2015-10-27 LAB — TSH: TSH: 1.22 m[IU]/L

## 2015-10-27 LAB — FERRITIN: FERRITIN: 182 ng/mL (ref 10–232)

## 2015-10-27 LAB — IRON AND TIBC
%SAT: 40 % (ref 11–50)
IRON: 129 ug/dL (ref 45–160)
TIBC: 319 ug/dL (ref 250–450)
UIBC: 190 ug/dL (ref 125–400)

## 2015-10-27 LAB — BASIC METABOLIC PANEL WITH GFR
BUN: 17 mg/dL (ref 7–25)
CALCIUM: 9.8 mg/dL (ref 8.6–10.4)
CHLORIDE: 102 mmol/L (ref 98–110)
CO2: 25 mmol/L (ref 20–31)
CREATININE: 0.79 mg/dL (ref 0.50–1.05)
GFR, Est African American: >89 mL/min (ref >=60)
GFR, Est Non African American: 83 mL/min (ref >=60)
GLUCOSE: 89 mg/dL (ref 65–99)
Potassium: 4.5 mmol/L (ref 3.5–5.3)
Sodium: 139 mmol/L (ref 135–146)

## 2015-10-27 LAB — MICROALBUMIN / CREATININE URINE RATIO
Creatinine, Urine: 45 mg/dL (ref 20–320)
Microalb Creat Ratio: 4 mcg/mg creat (ref <30)
Microalb, Ur: 0.2 mg/dL

## 2015-10-27 LAB — VITAMIN D 25 HYDROXY (VIT D DEFICIENCY, FRACTURES): Vit D, 25-Hydroxy: 52 ng/mL (ref 30–100)

## 2015-10-27 LAB — VITAMIN B12: VITAMIN B 12: 416 pg/mL (ref 200–1100)

## 2015-10-27 LAB — INSULIN, FASTING: INSULIN FASTING, SERUM: 3.4 u[IU]/mL (ref 2.0–19.6)

## 2015-10-29 ENCOUNTER — Ambulatory Visit (INDEPENDENT_AMBULATORY_CARE_PROVIDER_SITE_OTHER): Payer: BLUE CROSS/BLUE SHIELD | Admitting: Physician Assistant

## 2015-10-29 ENCOUNTER — Encounter: Payer: Self-pay | Admitting: Physician Assistant

## 2015-10-29 VITALS — BP 120/76 | HR 78 | Temp 97.0°F | Resp 16 | Ht 67.0 in | Wt 168.8 lb

## 2015-10-29 DIAGNOSIS — F419 Anxiety disorder, unspecified: Secondary | ICD-10-CM | POA: Diagnosis not present

## 2015-10-29 DIAGNOSIS — R7303 Prediabetes: Secondary | ICD-10-CM

## 2015-10-29 DIAGNOSIS — G4700 Insomnia, unspecified: Secondary | ICD-10-CM

## 2015-10-29 DIAGNOSIS — Z0001 Encounter for general adult medical examination with abnormal findings: Secondary | ICD-10-CM | POA: Diagnosis not present

## 2015-10-29 DIAGNOSIS — M5412 Radiculopathy, cervical region: Secondary | ICD-10-CM

## 2015-10-29 DIAGNOSIS — R6889 Other general symptoms and signs: Secondary | ICD-10-CM

## 2015-10-29 DIAGNOSIS — I10 Essential (primary) hypertension: Secondary | ICD-10-CM | POA: Diagnosis not present

## 2015-10-29 DIAGNOSIS — F3341 Major depressive disorder, recurrent, in partial remission: Secondary | ICD-10-CM

## 2015-10-29 DIAGNOSIS — Z136 Encounter for screening for cardiovascular disorders: Secondary | ICD-10-CM | POA: Diagnosis not present

## 2015-10-29 DIAGNOSIS — R5383 Other fatigue: Secondary | ICD-10-CM

## 2015-10-29 NOTE — Patient Instructions (Addendum)
  Your ears and sinuses are connected by the eustachian tube. When your sinuses are inflamed, this can close off the tube and cause fluid to collect in your middle ear. This can then cause dizziness, popping, clicking, ringing, and echoing in your ears. This is often NOT an infection and does NOT require antibiotics, it is caused by inflammation so the treatments help the inflammation. This can take a long time to get better so please be patient.  Here are things you can do to help with this: - Try the Flonase or Nasonex or nasocort.  Remember to spray each nostril twice towards the outer part of your eye.  Do not sniff but instead pinch your nose and tilt your head back to help the medicine get into your sinuses.  The best time to do this is at bedtime.Stop if you get blurred vision or nose bleeds.  -While drinking fluids, pinch and hold nose close and swallow, to help open eustachian tubes to drain fluid behind ear drums. -Please pick one of the over the counter allergy medications below and take it once daily for allergies.  It will also help with fluid behind ear drums. Claritin or loratadine cheapest but likely the weakest  Zyrtec or certizine at night because it can make you sleepy The strongest is allegra or fexafinadine  Cheapest at [facility], [employer], [employer] -can use decongestant over the counter, please do not use if you have high blood pressure or certain heart conditions.   if worsening HA, changes vision/speech, imbalance, weakness go to the ER   [facility]  7 a.m.-6:30 p.m., Monday 7 a.m.-5 p.m., Tuesday-Friday Schedule an appointment by calling [phone].  Encourage you to get the 3D Mammogram  The 3D Mammogram is much more specific and sensitive to pick up breast cancer. For women with fibrocystic breast or lumpy breast it can be hard to determine if it is cancer or not but the 3D mammogram is able to tell this difference which cuts back on unneeded  additional tests or scary call backs.   - over 40% increase in detection of breast cancer - over 40% reduction in false positives.  - fewer call backs - reduced anxiety - improved outcomes - [place] [facility]
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
## 2015-11-13 ENCOUNTER — Telehealth: Payer: Self-pay | Admitting: *Deleted

## 2015-11-13 NOTE — Telephone Encounter (Signed)
Patient was notified last month regarding 04 recall. Please follow up with patient to see if she had her 6 month left breast U/S done  Thanks Margaretha Sheffield

## 2015-11-13 NOTE — Telephone Encounter (Signed)
Left message for patient to call back and let me know if she has had her 6 month left breast u/s done.

## 2015-11-20 ENCOUNTER — Encounter: Payer: Self-pay | Admitting: Physician Assistant

## 2015-11-20 ENCOUNTER — Other Ambulatory Visit: Payer: Self-pay | Admitting: Physician Assistant

## 2015-11-20 MED ORDER — DULOXETINE HCL 60 MG PO CPEP: ORAL_CAPSULE | ORAL | 0 refills | Status: DC

## 2015-11-22 ENCOUNTER — Encounter: Payer: Self-pay | Admitting: *Deleted

## 2015-11-22 NOTE — Telephone Encounter (Signed)
Letter mailed, ok to close encounter?

## 2015-11-22 NOTE — Telephone Encounter (Signed)
Dr. Quincy Simmonds, I have prepared a letter, will print upon your approval. Please review pending letter. Thanks.

## 2015-11-22 NOTE — Telephone Encounter (Signed)
Encounter closed

## 2015-11-22 NOTE — Telephone Encounter (Signed)
I agree with this letter.

## 2015-11-22 NOTE — Telephone Encounter (Signed)
Please sent letter to patient regarding her need to do her follow up diagnostic mammogram and ultrasound.  I have also received a letter form [facility] regarding the need for this.

## 2015-12-01 ENCOUNTER — Other Ambulatory Visit: Payer: Self-pay | Admitting: Internal Medicine

## 2015-12-03 NOTE — Telephone Encounter (Signed)
Dr.Silva I see where we sent patient a letter to schedule follow up Lt.Diag.mammogram and Lt.[place]. I called [facility] to see if patient has been seen or scheduled appointment. Sheppard Coil stated they have called patient twice and sent her a letter also without any results. Can we take this patient out of recall? Routed to provider

## 2015-12-06 NOTE — Telephone Encounter (Signed)
Patient needs our official mammogram follow up letter which includes risks of not follow up.  Cc- Marisa Sprinkles

## 2015-12-11 ENCOUNTER — Other Ambulatory Visit: Payer: Self-pay | Admitting: Physician Assistant

## 2015-12-11 ENCOUNTER — Encounter: Payer: Self-pay | Admitting: *Deleted

## 2015-12-11 NOTE — Telephone Encounter (Signed)
Letter to your desk for review and signature. 

## 2015-12-11 NOTE — Telephone Encounter (Signed)
Rx called in to [facility] pharmacy.

## 2015-12-16 ENCOUNTER — Encounter: Payer: Self-pay | Admitting: Physician Assistant

## 2015-12-17 MED ORDER — ESOMEPRAZOLE MAGNESIUM 40 MG PO CPDR: 40.0000 mg | DELAYED_RELEASE_CAPSULE | Freq: Two times a day (BID) | ORAL | 1 refills | Status: DC

## 2015-12-18 ENCOUNTER — Ambulatory Visit: Payer: BLUE CROSS/BLUE SHIELD

## 2015-12-21 ENCOUNTER — Ambulatory Visit (INDEPENDENT_AMBULATORY_CARE_PROVIDER_SITE_OTHER): Payer: BLUE CROSS/BLUE SHIELD | Admitting: Physician Assistant

## 2015-12-21 VITALS — BP 126/70 | HR 77 | Temp 99.0°F | Resp 18 | Ht 67.0 in | Wt 172.0 lb

## 2015-12-21 DIAGNOSIS — J029 Acute pharyngitis, unspecified: Secondary | ICD-10-CM

## 2015-12-21 DIAGNOSIS — R0982 Postnasal drip: Secondary | ICD-10-CM | POA: Diagnosis not present

## 2015-12-21 DIAGNOSIS — K219 Gastro-esophageal reflux disease without esophagitis: Secondary | ICD-10-CM | POA: Insufficient documentation

## 2015-12-21 NOTE — Progress Notes (Signed)
Gabriela [name]  MRN: [id number] DOB: 04-12-1955  PCP: Alesia Richards, MD  Subjective:  Pt is a 60 year old female who was recently diagnosed with laryngopharyngeal reflux, who presents with swollen tonsil and sore throat.  She has a history of having issues with her tonsils.    Recently she has been "gagging really badly" and thinks this is due to tonsil stones. She has removed a few stones with a toothpick recently, and would to be checked for any more stones and have them removed. She calls her left tonsil her "big tonsil" and states it is irritating. Notes when her voice changes to a lower pitch, her throat is irritated and she has coughing fits.   History of sore throat - Saw ENT two months ago and was scoped - diagnosed with LPR, aka "silent reflux", not prescribed any medication or given any direction as to what she is to do. She has been doing research on the internet, got a prescription for Nexium 40 mg, started it three days ago. Is feeling a little better. She has made dietary changes like cutting back on red wine, no tomato-based or spicy foods, put in an alkaline water filter.   Works at [employer].   Review of Systems  Constitutional: Negative for chills, diaphoresis, fatigue and fever.  HENT: Positive for postnasal drip and sore throat. Negative for congestion, rhinorrhea, sinus pressure, sneezing, trouble swallowing and voice change.   Respiratory: Negative for cough, chest tightness, shortness of breath and wheezing.   Cardiovascular: Negative for chest pain and palpitations.  Gastrointestinal: Negative for abdominal pain, diarrhea, nausea and vomiting.  Neurological: Negative for dizziness, weakness, light-headedness and headaches.    Patient Active Problem List   Diagnosis Date Noted  . Essential hypertension 10/29/2015  . Ovarian cyst 12/12/2014  . Thoracic disc herniation 11/06/2014  . Pectoralis muscle strain 04/26/2014  . Prediabetes 04/18/2014  .  Fatigue 04/18/2014  . Rotator cuff impingement syndrome of left shoulder 12/06/2013  . Biceps tendinopathy 12/06/2013  . Allergy   . Anxiety   . Insomnia   . Sprain of costochondral joint 09/14/2012  . Neck pain on right side 12/05/2011  . Rotator cuff impingement syndrome 12/05/2011  . Groin discomfort 06/30/2011  . Major depressive disorder, recurrent ([place]) 03/10/2011    Class: Acute  . Brachial neuritis or radiculitis 08/21/2009  . WRIST PAIN, LEFT 06/28/2008  . PES ANSERINUS TENDINITIS OR BURSITIS 12/07/2007  . LUMBOSACRAL STRAIN, ACUTE 03/31/2007    Current Outpatient Prescriptions on File Prior to Visit  Medication Sig Dispense Refill  . Cetirizine HCl (ZYRTEC ALLERGY PO) Take 1 tablet by mouth daily. Reported on 04/30/2015    . Cholecalciferol (VITAMIN D3) 5000 units CAPS Take 1 capsule by mouth daily.    . cyclobenzaprine (FLEXERIL) 5 MG tablet TAKE 1/2 TO 1 TABLET BY MOUTH EVERY NIGHT AT BEDTIME (Patient taking differently: Take 2.5-5 mg by mouth daily as needed for muscle spasms. ) 60 tablet 1  . diazepam (VALIUM) 5 MG tablet TAKE 1 TABLET BY MOUTH EVERY 12 HOURS AS NEEDED FOR ANXIETY 60 tablet 0  . diphenhydrAMINE (BENADRYL) 25 mg capsule Take 25 mg by mouth every 6 (six) hours as needed.    . DULoxetine (CYMBALTA) 60 MG capsule Take 21 capsule daily - follow-up OV due late  December 60 capsule 0  . EPINEPHrine (EPIPEN 2-PAK) 0.3 mg/0.3 mL DEVI Inject 0.3 mLs (0.3 mg total) into the muscle as needed (anaphylaxis). 1 Device 0  . esomeprazole (NEXIUM)  40 MG capsule Take 1 capsule (40 mg total) by mouth 2 (two) times daily before a meal. 60 capsule 1  . gabapentin (NEURONTIN) 300 MG capsule Take 1 capsule (300 mg total) by mouth 3 (three) times daily. 90 capsule 3  . HYDROcodone-ibuprofen (VICOPROFEN) 7.5-200 MG tablet Take 1 tablet by mouth 3 (three) times daily as needed for moderate pain or severe pain. 90 tablet 0  . loratadine (CLARITIN) 10 MG tablet Take 1 tablet (10 mg  total) by mouth daily. 30 tablet 4  . metFORMIN (GLUCOPHAGE XR) 500 MG 24 hr tablet Take 1 tablet (500 mg total) by mouth 2 (two) times daily. 60 tablet 5  . ranitidine (ZANTAC) 150 MG tablet Take 1 tablet (150 mg total) by mouth 2 (two) times daily. 60 tablet 2  . triamcinolone (NASACORT AQ) 55 MCG/ACT AERO nasal inhaler Place 2 sprays into the nose daily. 1 Inhaler 12   No current facility-administered medications on file prior to visit.     Allergies  Allergen Reactions  . Acetaminophen Nausea Only  . Prednisone Other (See Comments)    Gets very manic     Objective:  BP 126/70 (BP Location: Right Arm, Patient Position: Sitting, Cuff Size: Small)   Pulse 77   Temp 99 F (37.2 C) (Oral)   Resp 18   Ht 5\' 7"  (1.702 m)   Wt 172 lb (78 kg)   LMP 02/10/2009 (Approximate)   SpO2 97%   BMI 26.94 kg/m   Physical Exam  Constitutional: She is oriented to person, place, and time and well-developed, well-nourished, and in no distress. No distress.  HENT:  Mouth/Throat: Mucous membranes are normal. Posterior oropharyngeal erythema present. No oropharyngeal exudate, posterior oropharyngeal edema or tonsillar abscesses.  No tonsilliths noted. Some cobblestoning appreciated in oropharynx.   Cardiovascular: Normal rate, regular rhythm and normal heart sounds.   Pulmonary/Chest: Effort normal. No respiratory distress.  Neurological: She is alert and oriented to person, place, and time. GCS score is 15.  Skin: Skin is warm and dry.  Psychiatric: Mood, memory, affect and judgment normal.  Vitals reviewed.   Assessment and Plan :  1. Sore throat 2. PND 3. Laryngopharyngeal reflux (LPR) - Supportive care. Encouraged salt water gargles, warm tea and honey, antihistamine nasal spray, drink plenty of fluids. RTC if symptoms do not improve.   Mercer Pod, PA-C  [facility] 12/21/2015 5:44 PM

## 2015-12-21 NOTE — Patient Instructions (Signed)
     IF you received an x-ray today, you will receive an invoice from [facility]. Please contact [facility] at [phone] with questions or concerns regarding your invoice.   IF you received labwork today, you will receive an invoice from [facility]. Please contact [facility] at [phone] with questions or concerns regarding your invoice.   Our billing staff will not be able to assist you with questions regarding bills from these companies.  You will be contacted with the lab results as soon as they are available. The fastest way to get your results is to activate your My Chart account. Instructions are located on the last page of this paperwork. If you have not heard from us regarding the results in 2 weeks, please contact this office.      

## 2016-01-09 ENCOUNTER — Encounter: Payer: Self-pay | Admitting: Physician Assistant

## 2016-01-10 ENCOUNTER — Ambulatory Visit (INDEPENDENT_AMBULATORY_CARE_PROVIDER_SITE_OTHER): Payer: BLUE CROSS/BLUE SHIELD | Admitting: Physician Assistant

## 2016-01-10 ENCOUNTER — Encounter: Payer: Self-pay | Admitting: Physician Assistant

## 2016-01-10 VITALS — BP 130/80 | HR 84 | Temp 98.1°F | Resp 16 | Ht 67.0 in | Wt 172.6 lb

## 2016-01-10 DIAGNOSIS — K2100 Gastro-esophageal reflux disease with esophagitis, without bleeding: Secondary | ICD-10-CM

## 2016-01-10 MED ORDER — ESOMEPRAZOLE MAGNESIUM 40 MG PO CPDR: 40.0000 mg | DELAYED_RELEASE_CAPSULE | Freq: Two times a day (BID) | ORAL | 1 refills | Status: DC

## 2016-01-10 MED ORDER — METOCLOPRAMIDE HCL 5 MG PO TABS: 5.0000 mg | ORAL_TABLET | Freq: Three times a day (TID) | ORAL | 1 refills | Status: DC

## 2016-01-10 NOTE — Progress Notes (Signed)
Subjective:    Patient ID: Gabriela [name], female    DOB: 09/18/1955, 60 y.o.   MRN: [id number]  HPI 60 y.o. WF presents for GERD symptoms. She started with symptoms in late July of hoarseness, sore throat, mouth ulcers, ear pain and she went to an UC that gave her antibiotic. She went to an ENT and found that she had reflux. She is on nexium which is helping some.   Blood pressure 130/80, pulse 84, temperature 98.1 F (36.7 C), resp. rate 16, height 5\' 7"  (1.702 m), weight 172 lb 9.6 oz (78.3 kg), last menstrual period 02/10/2009, SpO2 98 %.  Medications Current Outpatient Prescriptions on File Prior to Visit  Medication Sig  . Cetirizine HCl (ZYRTEC ALLERGY PO) Take 1 tablet by mouth daily. Reported on 04/30/2015  . Cholecalciferol (VITAMIN D3) 5000 units CAPS Take 1 capsule by mouth daily.  . cyclobenzaprine (FLEXERIL) 5 MG tablet TAKE 1/2 TO 1 TABLET BY MOUTH EVERY NIGHT AT BEDTIME (Patient taking differently: Take 2.5-5 mg by mouth daily as needed for muscle spasms. )  . diazepam (VALIUM) 5 MG tablet TAKE 1 TABLET BY MOUTH EVERY 12 HOURS AS NEEDED FOR ANXIETY  . diphenhydrAMINE (BENADRYL) 25 mg capsule Take 25 mg by mouth every 6 (six) hours as needed.  . DULoxetine (CYMBALTA) 60 MG capsule Take 21 capsule daily - follow-up OV due late  December  . EPINEPHrine (EPIPEN 2-PAK) 0.3 mg/0.3 mL DEVI Inject 0.3 mLs (0.3 mg total) into the muscle as needed (anaphylaxis).  Marland Kitchen esomeprazole (NEXIUM) 40 MG capsule Take 1 capsule (40 mg total) by mouth 2 (two) times daily before a meal.  . gabapentin (NEURONTIN) 300 MG capsule Take 1 capsule (300 mg total) by mouth 3 (three) times daily.  Marland Kitchen HYDROcodone-ibuprofen (VICOPROFEN) 7.5-200 MG tablet Take 1 tablet by mouth 3 (three) times daily as needed for moderate pain or severe pain.  Marland Kitchen loratadine (CLARITIN) 10 MG tablet Take 1 tablet (10 mg total) by mouth daily.  . metFORMIN (GLUCOPHAGE XR) 500 MG 24 hr tablet Take 1 tablet (500 mg total) by  mouth 2 (two) times daily.  . ranitidine (ZANTAC) 150 MG tablet Take 1 tablet (150 mg total) by mouth 2 (two) times daily.  Marland Kitchen triamcinolone (NASACORT AQ) 55 MCG/ACT AERO nasal inhaler Place 2 sprays into the nose daily.   No current facility-administered medications on file prior to visit.     Problem list She has WRIST PAIN, LEFT; Brachial neuritis or radiculitis; Major depressive disorder, recurrent ([place]); Groin discomfort; Neck pain on right side; Sprain of costochondral joint; Allergy; Anxiety; Insomnia; Rotator cuff impingement syndrome of left shoulder; Biceps tendinopathy; Prediabetes; Fatigue; Pectoralis muscle strain; Thoracic disc herniation; Ovarian cyst; Essential hypertension; and Laryngopharyngeal reflux (LPR) on her problem list.   Review of Systems  Constitutional: Negative.   HENT: Negative.  Negative for sore throat.   Eyes: Negative.   Respiratory: Positive for shortness of breath. Negative for cough, choking and wheezing.   Cardiovascular: Negative.  Negative for chest pain.  Gastrointestinal: Positive for abdominal distention, abdominal pain and nausea. Negative for anal bleeding, blood in stool, constipation, diarrhea, rectal pain and vomiting.  Genitourinary: Negative.   Musculoskeletal: Negative.   Skin: Negative.   Neurological: Negative.   Hematological: Negative.   Psychiatric/Behavioral: Negative.        Objective:   Physical Exam  Constitutional: She is oriented to person, place, and time. She appears well-developed and well-nourished.  HENT:  Head: Normocephalic and atraumatic.  Right Ear: External ear normal.  Left Ear: External ear normal.  Mouth/Throat: Oropharynx is clear and moist.  Eyes: Conjunctivae and EOM are normal. Pupils are equal, round, and reactive to light.  Neck: Normal range of motion. Neck supple. No thyromegaly present.  Cardiovascular: Normal rate, regular rhythm and normal heart sounds.  Exam reveals no gallop and no friction  rub.   No murmur heard. Pulmonary/Chest: Effort normal and breath sounds normal. No respiratory distress. She has no wheezes.  Abdominal: Soft. Bowel sounds are normal. She exhibits no shifting dullness, no distension, no abdominal bruit, no pulsatile midline mass and no mass. There is no hepatosplenomegaly. There is tenderness in the epigastric area. There is no rigidity, no rebound, no guarding, no CVA tenderness, no tenderness at McBurney's point and negative Murphy's sign. No hernia.  Musculoskeletal: Normal range of motion.  Lymphadenopathy:    She has no cervical adenopathy.  Neurological: She is alert and oriented to person, place, and time.  Skin: Skin is warm and dry.  Psychiatric: She has a normal mood and affect.        Assessment & Plan:  GERD/reflux Continue nexium, will do 2 x a day for 2-4 weeks, then once a day Will give reglan to take as needed Stop the gabapentin for now If not better will send to GI for EGD Discussed diet

## 2016-01-10 NOTE — Patient Instructions (Addendum)
Continue nexium, will do 2 x a day for 2 weeks, then once a day Will give reglan to take as needed up to 3 x a day, can 20-30 mins before each Stop the gabapentin for now go to 300mg  for 1-2 weeks then stop If not better will send to GI for EGD   Food Choices for Gastroesophageal Reflux Disease, Adult When you have gastroesophageal reflux disease (GERD), the foods you eat and your eating habits are very important. Choosing the right foods can help ease your discomfort. What guidelines do I need to follow?  Choose fruits, vegetables, whole grains, and low-fat dairy products.  Choose low-fat meat, fish, and poultry.  Limit fats such as oils, salad dressings, butter, nuts, and avocado.  Keep a food diary. This helps you identify foods that cause symptoms.  Avoid foods that cause symptoms. These may be different for everyone.  Eat small meals often instead of 3 large meals a day.  Eat your meals slowly, in a place where you are relaxed.  Limit fried foods.  Cook foods using methods other than frying.  Avoid drinking alcohol.  Avoid drinking large amounts of liquids with your meals.  Avoid bending over or lying down until 2-3 hours after eating. What foods are not recommended? These are some foods and drinks that may make your symptoms worse: Vegetables  Tomatoes. Tomato juice. Tomato and spaghetti sauce. Chili peppers. Onion and garlic. Horseradish. Fruits  Oranges, grapefruit, and lemon (fruit and juice). Meats  High-fat meats, fish, and poultry. This includes hot dogs, ribs, ham, sausage, salami, and bacon. Dairy  Whole milk and chocolate milk. Sour cream. Cream. Butter. Ice cream. Cream cheese. Drinks  Coffee and tea. Bubbly (carbonated) drinks or energy drinks. Condiments  Hot sauce. Barbecue sauce. Sweets/Desserts  Chocolate and cocoa. Donuts. Peppermint and spearmint. Fats and Oils  High-fat foods. This includes [place] fries and potato chips. Other  Vinegar.  Strong spices. This includes black pepper, white pepper, red pepper, cayenne, curry powder, cloves, ginger, and chili powder. The items listed above may not be a complete list of foods and drinks to avoid. Contact your dietitian for more information.  This information is not intended to replace advice given to you by your health care provider. Make sure you discuss any questions you have with your health care provider. Document Released: 07/29/2011 Document Revised: 07/05/2015 Document Reviewed: 12/01/2012 Elsevier Interactive Patient Education  2017 [employer].

## 2016-01-16 ENCOUNTER — Encounter: Payer: Self-pay | Admitting: Physician Assistant

## 2016-01-18 ENCOUNTER — Encounter: Payer: Self-pay | Admitting: Physician Assistant

## 2016-01-21 ENCOUNTER — Encounter: Payer: Self-pay | Admitting: Physician Assistant

## 2016-02-13 ENCOUNTER — Ambulatory Visit (INDEPENDENT_AMBULATORY_CARE_PROVIDER_SITE_OTHER): Payer: BLUE CROSS/BLUE SHIELD | Admitting: Internal Medicine

## 2016-02-13 ENCOUNTER — Encounter: Payer: Self-pay | Admitting: Internal Medicine

## 2016-02-13 VITALS — BP 134/82 | HR 82 | Temp 98.2°F | Resp 16 | Ht 67.0 in

## 2016-02-13 DIAGNOSIS — R21 Rash and other nonspecific skin eruption: Secondary | ICD-10-CM

## 2016-02-13 MED ORDER — TRIAMCINOLONE ACETONIDE 0.1 % EX CREA: 1.0000 "application " | TOPICAL_CREAM | Freq: Four times a day (QID) | CUTANEOUS | 0 refills | Status: DC

## 2016-02-13 MED ORDER — DEXAMETHASONE 0.75 MG PO TABS
ORAL_TABLET | ORAL | 0 refills | Status: DC
Start: 2016-02-13 — End: 2016-11-03

## 2016-02-13 MED ORDER — DEXAMETHASONE 0.75 MG PO TABS: ORAL_TABLET | ORAL | 0 refills | Status: DC
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
## 2016-02-13 NOTE — Patient Instructions (Signed)
Please take the decadron (dexamethasone) tablets with breakfast daily.  Please take your zyrtec daily in the monring.  You can continue to take benadryl every 6 hours while you are awake.  Please use kenalog (triamcinolone) cream on the rash.  You only need to place a thin layer on the affected area.  You can place some saran wrap on top of the ointment to help push it into your skin.  Do not use this cream on your face or your genitals.  Please take hydrocortisone cream and use it on the rash on your face.  Please stop the nexium.  Please take ranitidine 300 mg in the morning with breakfast and then again at bedtime.  You can use cool compresses, calamine lotion, or cool oatmeal baths to help with itching.

## 2016-02-15 ENCOUNTER — Encounter: Payer: Self-pay | Admitting: Physician Assistant

## 2016-02-20 ENCOUNTER — Other Ambulatory Visit: Payer: Self-pay | Admitting: Physician Assistant

## 2016-02-22 ENCOUNTER — Telehealth: Payer: Self-pay

## 2016-02-22 NOTE — Telephone Encounter (Signed)
Pt's complaint was that since starting the DECADRON she has face numbness & tongue. No taste     Per provider stop decadron & go to ER if sxs get worse or change in speech or weakness.   Pt agreed & hung up

## 2016-02-28 ENCOUNTER — Other Ambulatory Visit: Payer: Self-pay | Admitting: Physician Assistant

## 2016-02-28 ENCOUNTER — Encounter: Payer: Self-pay | Admitting: Physician Assistant

## 2016-02-28 MED ORDER — SUCRALFATE 1 G PO TABS: 1.0000 g | ORAL_TABLET | Freq: Three times a day (TID) | ORAL | 1 refills | Status: DC | PRN

## 2016-03-04 ENCOUNTER — Ambulatory Visit: Payer: Self-pay | Admitting: Physician Assistant

## 2016-03-04 ENCOUNTER — Other Ambulatory Visit: Payer: Self-pay | Admitting: *Deleted

## 2016-03-04 MED ORDER — HYDROCODONE-IBUPROFEN 7.5-200 MG PO TABS: 1.0000 | ORAL_TABLET | Freq: Three times a day (TID) | ORAL | 0 refills | Status: DC | PRN

## 2016-03-25 ENCOUNTER — Ambulatory Visit (INDEPENDENT_AMBULATORY_CARE_PROVIDER_SITE_OTHER): Payer: Worker's Compensation

## 2016-03-25 ENCOUNTER — Ambulatory Visit (INDEPENDENT_AMBULATORY_CARE_PROVIDER_SITE_OTHER): Payer: Worker's Compensation | Admitting: Physician Assistant

## 2016-03-25 VITALS — BP 116/70 | HR 94 | Temp 99.1°F | Resp 18 | Ht 67.0 in | Wt 173.0 lb

## 2016-03-25 DIAGNOSIS — S63502A Unspecified sprain of left wrist, initial encounter: Secondary | ICD-10-CM | POA: Diagnosis not present

## 2016-03-25 DIAGNOSIS — M25532 Pain in left wrist: Secondary | ICD-10-CM | POA: Diagnosis not present

## 2016-03-25 DIAGNOSIS — S63613A Unspecified sprain of left middle finger, initial encounter: Secondary | ICD-10-CM | POA: Diagnosis not present

## 2016-03-25 NOTE — Progress Notes (Signed)
Gabriela [name] [birth year] 61 y.o.   Chief Complaint  Patient presents with  . Wrist Injury    workers comp/ left     Presents for evaluation of work-related complaint.  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 She was at work last night when a display case failed, sending cases of rum to the floor, breaking bottles and spilling. She went to clean it up when she slipped and fell. She believes she fell forward onto her abdomen with her left wrist underneath. She immediately iced her wrist while at work and took 2 ibuprofen last evening.  Endorses some decreased ROM and swelling of left wrist. Pain is 5/10 and worsens with activities like turning keys to her car and opening doors.  Also c/o pain of her left 3rd digit. Hurts worse with bending and gripping. She has not moved it much since last night.   Denies bruising, numbness, tingling, laceration, change of temperature of wrist or hand.   Review of Systems  Constitutional: Negative for chills and fever.  Cardiovascular: Negative for chest pain, palpitations and leg swelling.  Musculoskeletal: Positive for falls and myalgias (left wrist).  Skin: Negative.   Neurological: Negative for tingling, sensory change, loss of consciousness and weakness.    Current medications and allergies reviewed and updated. Past medical history, family history, social history have been reviewed and updated.   Physical Exam  Constitutional: She is oriented to person, place, and time and well-developed, well-nourished, and in no distress. No distress.  Cardiovascular: Normal rate, regular rhythm and normal heart sounds.   Musculoskeletal:       Left wrist: She exhibits decreased range of motion (flexion and extension, about 15 degrees less ROM than right wrist), tenderness (TTP Dorsal midline wrist) and swelling (thenar eminence ). She exhibits no effusion, no  crepitus, no deformity and no laceration.  Not TTP anatomical snuffbox or pisiform. Full ROM all digits of hand. Mildly TTP 3rd digit. Strength 5/5.   Neurological: She is alert and oriented to person, place, and time. She has normal sensation. She displays no weakness. GCS score is 15.  Skin: Skin is warm and dry.  Psychiatric: Mood, memory, affect and judgment normal.  Vitals reviewed.   Dg Wrist Complete Left  Result Date: 03/25/2016 CLINICAL DATA:  Fall, left wrist pain EXAM: LEFT WRIST - COMPLETE 3+ VIEW COMPARISON:  None. FINDINGS: Four views of the left wrist submitted. No acute fracture or subluxation. Mild degenerative changes first carpometacarpal joint. IMPRESSION: No acute fracture or subluxation. Mild degenerative changes first carpometacarpal joint. Electronically Signed   By: Lahoma Crocker M.D.   On: 03/25/2016 11:57   Dg Hand Complete Left  Result Date: 03/25/2016 CLINICAL DATA:  Fall.  Left wrist and third finger pain. EXAM: LEFT HAND - COMPLETE 3+ VIEW COMPARISON:  None. FINDINGS: No fracture or dislocation. No suspicious focal osseous lesion. Mild osteoarthritis at the first carpometacarpal joint, in the interphalangeal joint of the left thumb and in the distal interphalangeal joint of the left second finger. No radiopaque foreign body. IMPRESSION: No fracture or malalignment.  Mild polyarticular osteoarthritis. Electronically Signed   By: Ilona Sorrel M.D.   On: 03/25/2016 11:58    Assessment and Plan: 1. Sprain of left wrist, initial encounter 2. Left wrist pain 3. Sprain of left middle finger, unspecified site of finger, initial encounter - DG Wrist  Complete Left; Future - DG Hand Complete Left; Future - I believe this injury is due to an incident that occurred at her place of work. 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 Supportive care discussed: rest, ice, compression, ibuprofen for pain. Ace wrap applied. RTC in  one week for f/u.

## 2016-03-25 NOTE — Patient Instructions (Addendum)
Please come back and see me in one week for follow-up. Please see below for home-care tips.   Thank you for coming in today. I hope you feel we met your needs.  Feel free to call [facility] if you have any questions or further requests.  Please consider signing up for MyChart if you do not already have it, as this is a great way to communicate with me.  Best,  Whitney McVey, PA-C   Wrist Sprain, Adult A wrist sprain is a stretch or tear in the strong, fibrous tissues (ligaments) that connect your wrist bones. There are three types of wrist sprains:  Grade 1. In this type of sprain, the ligament is stretched more than normal.  Grade 2. In this type of sprain, the ligament is partially torn. You may be able to move your wrist, but not very much.  Grade 3. In this type of sprain, the ligament or muscle is completely torn. You may find it difficult or extremely painful to move your wrist even a little. What are the causes? A wrist sprain can be caused by using the wrist too much during sports, exercise, or at work. It can also happen with a fall or during an accident. What increases the risk? This condition is more likely to occur in people:  With a previous wrist or arm injury.  With poor wrist strength and flexibility.  Who play contact sports, such as football or soccer.  Who play sports that may result in a fall, such as skateboarding, biking, skiing, or snowboarding.  Who do not exercise regularly.  Who use exercise equipment that does not fit well. What are the signs or symptoms? Symptoms of this condition include:  Pain in the wrist, arm, or hand.  Swelling or bruised skin near the wrist, hand, or arm. The skin may look yellow or kind of blue.  Stiffness or trouble moving the hand.  Hearing a pop or feeling a tear at the time of the injury.  A warm feeling in the skin around the wrist. How is this diagnosed? This condition is diagnosed with a physical exam. Sometimes an  X-ray is taken to make sure a bone did not break. If your health care provider thinks that you tore a ligament, he or she may order an MRI of your wrist. How is this treated? This condition is treated by resting and applying ice to your wrist. Additional treatment may include:  Medicine for pain and inflammation.  A splint to keep your wrist still (immobilized).  Exercises to strengthen and stretch your wrist.  Surgery. This may be done if the ligament is completely torn. Follow these instructions at home: If you have a splint:  Do not put pressure on any part of the splint until it is fully hardened. This may take several hours.  Wear the splint as told by your health care provider. Remove it only as told by your health care provider.  Loosen the splint if your fingers tingle, become numb, or turn cold and blue.  If your splint is not waterproof:  Do not let it get wet.  Cover it with a watertight covering when you take a bath or a shower.  Keep the splint clean. Managing pain, stiffness, and swelling  If directed, put ice on the injured area.  If you have a removable splint, remove it as told by your health care provider.  Put ice in a plastic bag.  Place a towel between your skin and  the bag or between the splint and the bag.  Leave the ice on for 20 minutes, 2-3 times per day.  Move your fingers often to avoid stiffness and to lessen swelling.  Raise (elevate) the injured area above the level of your heart while you are sitting or lying down. Activity  Rest your wrist. Do not do things that cause pain.  Return to your normal activities as told by your health care provider. Ask your health care provider what activities are safe for you.  Do exercises as told by your health care provider. General instructions  Take over-the-counter and prescription medicines only as told by your health care provider.  Do not use any products that contain nicotine or tobacco,  such as cigarettes and e-cigarettes. These can delay healing. If you need help quitting, ask your health care provider.  Ask your health care provider when it is safe to drive if you have a splint.  Keep all follow-up visits as told by your health care provider. This is important. Contact a health care provider if:  Your pain, bruising, or swelling gets worse.  Your skin becomes red, gets a rash, or has open sores.  Your pain does not get better or it gets worse. Get help right away if:  You have a new or sudden sharp pain in the hand, arm, or wrist.  You have tingling or numbness in your hand.  Your fingers turn white, very red, or cold and blue.  You cannot move your fingers. This information is not intended to replace advice given to you by your health care provider. Make sure you discuss any questions you have with your health care provider. Document Released: 09/30/2013 Document Revised: 08/25/2015 Document Reviewed: 08/16/2015 Elsevier Interactive Patient Education  2017 [employer].   IF you received an x-ray today, you will receive an invoice from [facility]. Please contact [facility] at [phone] with questions or concerns regarding your invoice.   IF you received labwork today, you will receive an invoice from [facility]. Please contact [facility] at [phone] with questions or concerns regarding your invoice.   Our billing staff will not be able to assist you with questions regarding bills from these companies.  You will be contacted with the lab results as soon as they are available. The fastest way to get your results is to activate your My Chart account. Instructions are located on the last page of this paperwork. If you have not heard from [place] regarding the results in 2 weeks, please contact this office.

## 2016-03-26 ENCOUNTER — Encounter: Payer: Self-pay | Admitting: Sports Medicine

## 2016-03-27 ENCOUNTER — Encounter: Payer: Self-pay | Admitting: *Deleted

## 2016-03-27 ENCOUNTER — Encounter: Payer: Worker's Compensation | Admitting: Sports Medicine

## 2016-03-27 ENCOUNTER — Encounter: Payer: Self-pay | Admitting: Physician Assistant

## 2016-03-31 ENCOUNTER — Ambulatory Visit (INDEPENDENT_AMBULATORY_CARE_PROVIDER_SITE_OTHER): Payer: Worker's Compensation | Admitting: Physician Assistant

## 2016-03-31 ENCOUNTER — Encounter: Payer: Self-pay | Admitting: Physician Assistant

## 2016-03-31 VITALS — BP 138/92 | HR 93 | Temp 98.9°F | Resp 16 | Ht 67.0 in | Wt 172.4 lb

## 2016-03-31 DIAGNOSIS — S63502D Unspecified sprain of left wrist, subsequent encounter: Secondary | ICD-10-CM

## 2016-03-31 DIAGNOSIS — Z026 Encounter for examination for insurance purposes: Secondary | ICD-10-CM

## 2016-03-31 NOTE — Progress Notes (Signed)
     Gabriela [name] 12-Apr-1955 61 y.o.   Chief Complaint  Patient presents with  . Follow-up    W/C left wrist    Presents for follow-up evaluation of work-related complaint.  Date of Injury: 03/24/2016  History of Present Illness: Pt is a pleasant 61 year old female presenting for f/u workers comp. Her initial OV was 2/13 for pain in her left wrist following a fall she sustained at work. Since her last OV her left wrist became more painful. Pain is located below her thumb and is radiating up her wrist. Worse with movement. She has been wearing a wrist splint since her last OV. X-ray at last OV negative.  She has been in contact with her Curator for [employer], Royston Bake. Will refer to orthopedics for further work-up.   Review of Systems  Constitutional: Negative for chills and fever.  Respiratory: Negative for cough, shortness of breath and wheezing.   Cardiovascular: Negative for chest pain, palpitations, claudication and leg swelling.  Musculoskeletal: Positive for falls and joint pain.  Neurological: Negative for dizziness, sensory change, focal weakness, weakness and headaches.   Current medications and allergies reviewed and updated. Past medical history, family history, social history have been reviewed and updated.   Physical Exam  Constitutional: She is oriented to person, place, and time and well-developed, well-nourished, and in no distress. No distress.  Cardiovascular: Normal rate, regular rhythm and normal heart sounds.   Musculoskeletal:       Left wrist: She exhibits tenderness (thenar eminence and base of 1st metacarpal. ). She exhibits normal range of motion, no swelling, no effusion, no crepitus and no deformity.  Neurological: She is alert and oriented to person, place, and time. GCS score is 15.  Skin: Skin is warm and dry.  Psychiatric: Mood, memory, affect and judgment normal.  Vitals reviewed.   Assessment  and Plan: 1. Sprain of left wrist, subsequent encounter 2. Encounter related to worker's compensation claim - Pt reports increase in wrist pain since her last OV. Is still wearing her thumb spica as advised at her last OV.  She has been in contact with her Curator for [employer], Royston Bake who will refer to orthopedics for further work-up. F/u with ortho.    Mercer Pod, PA-C [facility] 01/28/2017

## 2016-03-31 NOTE — Patient Instructions (Signed)
     IF you received an x-ray today, you will receive an invoice from [facility]. Please contact [facility] at [phone] with questions or concerns regarding your invoice.   IF you received labwork today, you will receive an invoice from [facility]. Please contact [facility] at [phone] with questions or concerns regarding your invoice.   Our billing staff will not be able to assist you with questions regarding bills from these companies.  You will be contacted with the lab results as soon as they are available. The fastest way to get your results is to activate your My Chart account. Instructions are located on the last page of this paperwork. If you have not heard from us regarding the results in 2 weeks, please contact this office.     

## 2016-04-03 ENCOUNTER — Encounter: Payer: Worker's Compensation | Admitting: Sports Medicine

## 2016-04-08 ENCOUNTER — Other Ambulatory Visit: Payer: Self-pay | Admitting: Physician Assistant

## 2016-04-08 MED ORDER — DIAZEPAM 5 MG PO TABS: 5.0000 mg | ORAL_TABLET | Freq: Two times a day (BID) | ORAL | 0 refills | Status: DC | PRN

## 2016-04-10 ENCOUNTER — Encounter: Payer: Self-pay | Admitting: Sports Medicine

## 2016-04-10 ENCOUNTER — Ambulatory Visit
Admission: RE | Admit: 2016-04-10 | Discharge: 2016-04-10 | Disposition: A | Discharge status: Home / Self Care | Payer: BLUE CROSS/BLUE SHIELD | Source: Ambulatory Visit | Attending: Sports Medicine | Admitting: Sports Medicine

## 2016-04-10 ENCOUNTER — Ambulatory Visit (INDEPENDENT_AMBULATORY_CARE_PROVIDER_SITE_OTHER): Payer: Self-pay | Admitting: Sports Medicine

## 2016-04-10 VITALS — BP 133/87 | HR 85 | Ht 67.0 in | Wt 172.0 lb

## 2016-04-10 DIAGNOSIS — S8002XA Contusion of left knee, initial encounter: Secondary | ICD-10-CM | POA: Insufficient documentation

## 2016-04-10 DIAGNOSIS — S6992XA Unspecified injury of left wrist, hand and finger(s), initial encounter: Secondary | ICD-10-CM | POA: Diagnosis not present

## 2016-04-10 DIAGNOSIS — M25532 Pain in left wrist: Secondary | ICD-10-CM | POA: Diagnosis not present

## 2016-04-10 DIAGNOSIS — S8992XD Unspecified injury of left lower leg, subsequent encounter: Secondary | ICD-10-CM

## 2016-04-10 DIAGNOSIS — S8992XA Unspecified injury of left lower leg, initial encounter: Secondary | ICD-10-CM

## 2016-04-10 NOTE — Assessment & Plan Note (Addendum)
Has left wrist pain especially in the anatomic snuffbox 2-1/2 weeks after her original injury. Recommended repeat x-rays to further evaluate the scaphoid and check for fracture. Continue thumb spica splint. Follow-up with either [facility] or with our office

## 2016-04-10 NOTE — Progress Notes (Signed)
Gabriela [name] - 61 y.o. female MRN [id number]  Date of birth: 30-Jun-1955  SUBJECTIVE:  Including CC & ROS.  CC:    Medication review  Patient presents for medication review due to neck pain and radiculopathy. She is taking Vicoprofen and Advil. She has had no changes in her medication regimen. She reports that she does not need any refills of it but just one to follow-up for the Vicoprofen. Does not appear to be distributing or abusing. Of note, she fell at work 2.5 weeks ago.  She injured her left wrist and left knee. She was seen at urgent care and had x-rays which were negative. She was placed in a wrist splint. She was sent to orthopedics for further management of the wrist and the knee.  She notes that she still has pain in that area of the wrist on the radial aspect. She is wondering about job status. She is being seen at [place] under [employer].    Regards to her left knee, she has pain mainly on the medial aspect and some on the lateral aspect of her knee. She denies any swelling. She is not sure how she fell onto her knee. Denies any catching or locking.  ROS: No unexpected weight loss, fever, chills, swelling, instability, muscle pain, numbness/tingling, redness, otherwise see HPI   PMHx - Updated and reviewed.  Contributory factors include: Thoracic disc herniation, depression, anxiety, prediabetes PSHx - Updated and reviewed.  Contributory factors include:  Negative FHx - Updated and reviewed.  Contributory factors include:  Negative Social Hx - Updated and reviewed. Contributory factors include: Negative Medications - reviewed   DATA REVIEWED: Urgent care office visits 3 view right wrist x-ray which does not show fracture but does show [place] joint arthritis  PHYSICAL EXAM:  VS: BP:133/87  HR:85bpm  TEMP: ( )  RESP:   HT:5\' 7"  (170.2 cm)   WT:172 lb (78 kg)  BMI:27 PHYSICAL EXAM: Gen: NAD, alert, cooperative with exam, well-appearing HEENT: clear  conjunctiva,  CV:  no edema, capillary refill brisk, normal rate Resp: non-labored Skin: no rashes, normal turgor  Neuro: no gross deficits.  Psych:  alert and oriented  Wrist and hand: No swelling or erythema noted on inspection. Tenderness to palpation at the anatomic snuffbox, CMC joint on left No tenderness to palpation along the carpal bones, distal radius or ulna, metacarpophalangeal joints, PIP joint, DIP joint Full range of motion of wrist at flexion, extension, supination, pronation, radial deviation, ulnar deviation bilaterally Full range of motion at metacarpal phalangeal joints, PIP and DIP joints bilaterally, except for the left thumb where she does have limited range of motion in all directions secondary to pain Full strength and above planes of the wrist and hand Finkelstein's negative, Tinel's negative, Phalen's negative Sensation intact and pulses intact bilaterally  Knee: Normal to inspection with no erythema or effusion or obvious bony abnormalities. Palpation normal with no warmth, joint line tenderness, patellar tenderness, or condyle tenderness. ROM full in flexion and extension and lower leg rotation. Ligaments with solid consistent endpoints including ACL, PCL, LCL, MCL. Negative Mcmurray's, Apley's, and Thessalonian tests. Non painful patellar compression. Patellar glide without crepitus. Patellar and quadriceps tendons unremarkable. Hamstring and quadriceps strength is normal.   Ultrasound: Left knee ultrasound was performed. Suprapatellar pouch was without effusion and quadriceps tendon intact without any changes and fibrillation rupture. Medial joint line with hypoechoic change at the area of the posterior meniscus and some swelling surrounding the joint space. Lateral joint  line with an intact meniscus and no swelling present. Findings consistent with contusion of the medial meniscus.   ASSESSMENT & PLAN:   WRIST PAIN, LEFT Has left wrist pain especially  in the anatomic snuffbox 2-1/2 weeks after her original injury. Recommended repeat x-rays to further evaluate the scaphoid and check for fracture. Continue thumb spica splint. Follow-up with either [facility] or with our office

## 2016-04-10 NOTE — Assessment & Plan Note (Signed)
Likely contusion of the medial meniscus. Cannot rule out a medial meniscus tear as do not have an MRI. Recommend doing exercises on a stationary bike or in the pool.  Follow-up in 4 weeks to assess progress.

## 2016-04-16 ENCOUNTER — Other Ambulatory Visit: Payer: Self-pay | Admitting: *Deleted

## 2016-04-16 ENCOUNTER — Other Ambulatory Visit: Payer: Self-pay | Admitting: Sports Medicine

## 2016-04-16 MED ORDER — HYDROCODONE-IBUPROFEN 7.5-200 MG PO TABS: 1.0000 | ORAL_TABLET | Freq: Three times a day (TID) | ORAL | 0 refills | Status: DC | PRN

## 2016-04-29 ENCOUNTER — Ambulatory Visit: Payer: Self-pay | Admitting: Physician Assistant

## 2016-05-05 ENCOUNTER — Other Ambulatory Visit: Payer: Self-pay | Admitting: Physician Assistant

## 2016-05-26 ENCOUNTER — Other Ambulatory Visit: Payer: Self-pay | Admitting: Physician Assistant

## 2016-05-28 ENCOUNTER — Other Ambulatory Visit: Payer: Self-pay | Admitting: *Deleted

## 2016-05-28 MED ORDER — HYDROCODONE-IBUPROFEN 7.5-200 MG PO TABS: 1.0000 | ORAL_TABLET | Freq: Three times a day (TID) | ORAL | 0 refills | Status: DC | PRN

## 2016-06-02 ENCOUNTER — Other Ambulatory Visit: Payer: Self-pay | Admitting: Internal Medicine

## 2016-06-02 MED ORDER — DIAZEPAM 5 MG PO TABS: 5.0000 mg | ORAL_TABLET | Freq: Two times a day (BID) | ORAL | 0 refills | Status: DC | PRN

## 2016-06-02 NOTE — Telephone Encounter (Signed)
Valium was called into pharmacy @ 12:28 pm on 23rd April 2018 by DD.

## 2016-06-30 ENCOUNTER — Other Ambulatory Visit: Payer: Self-pay | Admitting: Sports Medicine

## 2016-06-30 MED ORDER — HYDROCODONE-IBUPROFEN 7.5-200 MG PO TABS: 1.0000 | ORAL_TABLET | Freq: Three times a day (TID) | ORAL | 0 refills | Status: DC | PRN

## 2016-07-22 ENCOUNTER — Ambulatory Visit (INDEPENDENT_AMBULATORY_CARE_PROVIDER_SITE_OTHER): Payer: BLUE CROSS/BLUE SHIELD | Admitting: Physician Assistant

## 2016-07-22 ENCOUNTER — Encounter: Payer: Self-pay | Admitting: Physician Assistant

## 2016-07-22 VITALS — BP 138/80 | HR 79 | Temp 98.8°F | Resp 18 | Ht 66.0 in | Wt 168.0 lb

## 2016-07-22 DIAGNOSIS — L299 Pruritus, unspecified: Secondary | ICD-10-CM | POA: Diagnosis not present

## 2016-07-22 DIAGNOSIS — A46 Erysipelas: Secondary | ICD-10-CM | POA: Diagnosis not present

## 2016-07-22 DIAGNOSIS — W5503XA Scratched by cat, initial encounter: Secondary | ICD-10-CM

## 2016-07-22 MED ORDER — AMOXICILLIN-POT CLAVULANATE 875-125 MG PO TABS: 1.0000 | ORAL_TABLET | Freq: Two times a day (BID) | ORAL | 0 refills | Status: DC

## 2016-07-22 MED ORDER — HYDROXYZINE HCL 25 MG PO TABS: 25.0000 mg | ORAL_TABLET | Freq: Three times a day (TID) | ORAL | 1 refills | Status: DC | PRN

## 2016-07-22 NOTE — Progress Notes (Signed)
Gabriela [name]  MRN: [id number] DOB: 04/17/1955  PCP: Unk Pinto, MD  Subjective:  Pt is a 61 year old female who presents to clinic for bump in pelvic area x 3 days. Since that time the area has become red and warm to touch.   Endorses itchiness.  She believes she was scratched by her cat.  She cleaned it with alcohol. Is applying 2% erythromycin cream.  Denies drainage, fever, chills, n/v.   Review of Systems  Constitutional: Negative for chills, diaphoresis, fatigue and fever.  Skin: Positive for color change and wound.    Patient Active Problem List   Diagnosis Date Noted  . Injury of left knee 04/10/2016  . Laryngopharyngeal reflux (LPR) 12/21/2015  . Essential hypertension 10/29/2015  . Ovarian cyst 12/12/2014  . Thoracic disc herniation 11/06/2014  . Pectoralis muscle strain 04/26/2014  . Prediabetes 04/18/2014  . Fatigue 04/18/2014  . Rotator cuff impingement syndrome of left shoulder 12/06/2013  . Biceps tendinopathy 12/06/2013  . Allergy   . Anxiety   . Insomnia   . Sprain of costochondral joint 09/14/2012  . Neck pain on right side 12/05/2011  . Groin discomfort 06/30/2011  . Major depressive disorder, recurrent ([place]) 03/10/2011    Class: Acute  . Brachial neuritis or radiculitis 08/21/2009  . WRIST PAIN, LEFT 06/28/2008    Current Outpatient Prescriptions on File Prior to Visit  Medication Sig Dispense Refill  . Cetirizine HCl (ZYRTEC ALLERGY PO) Take 1 tablet by mouth daily. Reported on 04/30/2015    . Cholecalciferol (VITAMIN D3) 5000 units CAPS Take 1 capsule by mouth daily.    . cyclobenzaprine (FLEXERIL) 5 MG tablet TAKE 1/2 TO 1 TABLET BY MOUTH EVERY NIGHT AT BEDTIME (Patient taking differently: Take 2.5-5 mg by mouth daily as needed for muscle spasms. ) 60 tablet 1  . dexamethasone (DECADRON) 0.75 MG tablet Take 3 tablets with breakfast x 3 days, take 2 tablets with breakfast x 3 days, take 1 tablet with breakfast x 3 days, take 0.5 mg  tablets with breakfast x 3 days. 27 tablet 0  . diazepam (VALIUM) 5 MG tablet Take 1 tablet (5 mg total) by mouth every 12 (twelve) hours as needed. for anxiety 60 tablet 0  . diphenhydrAMINE (BENADRYL) 25 mg capsule Take 25 mg by mouth every 6 (six) hours as needed.    . DULoxetine (CYMBALTA) 60 MG capsule TAKE 1 CAPSULE(60 MG) BY MOUTH DAILY 90 capsule 0  . EPINEPHrine (EPIPEN 2-PAK) 0.3 mg/0.3 mL DEVI Inject 0.3 mLs (0.3 mg total) into the muscle as needed (anaphylaxis). 1 Device 0  . gabapentin (NEURONTIN) 300 MG capsule Take 1 capsule (300 mg total) by mouth 3 (three) times daily. 90 capsule 3  . HYDROcodone-ibuprofen (VICOPROFEN) 7.5-200 MG tablet Take 1 tablet by mouth 3 (three) times daily as needed for moderate pain or severe pain. 30 tablet 0  . loratadine (CLARITIN) 10 MG tablet Take 1 tablet (10 mg total) by mouth daily. 30 tablet 4  . metFORMIN (GLUCOPHAGE XR) 500 MG 24 hr tablet Take 1 tablet (500 mg total) by mouth 2 (two) times daily. 60 tablet 5  . metoCLOPramide (REGLAN) 5 MG tablet Take 1 tablet (5 mg total) by mouth 3 (three) times daily. 60 tablet 1  . ranitidine (ZANTAC) 150 MG tablet Take 1 tablet (150 mg total) by mouth 2 (two) times daily. 60 tablet 2  . sucralfate (CARAFATE) 1 g tablet Take 1 tablet (1 g total) by mouth 3 (three) times  daily as needed. 60 tablet 1  . triamcinolone (NASACORT AQ) 55 MCG/ACT AERO nasal inhaler Place 2 sprays into the nose daily. 1 Inhaler 12  . triamcinolone cream (KENALOG) 0.1 % Apply 1 application topically 4 (four) times daily. Place thin layer on affected skin 30 g 0  . DULoxetine (CYMBALTA) 60 MG capsule Take 21 capsule daily - follow-up OV due late  December 60 capsule 0   No current facility-administered medications on file prior to visit.     Allergies  Allergen Reactions  . Acetaminophen Nausea Only  . Prednisone Other (See Comments)    Gets very manic     Objective:  BP 138/80 (BP Location: Right Arm, Patient Position:  Sitting, Cuff Size: Normal)   Pulse 79   Temp 98.8 F (37.1 C) (Oral)   Resp 18   Ht 5\' 6"  (1.676 m)   Wt 168 lb (76.2 kg)   LMP 03/09/2011   SpO2 97%   BMI 27.12 kg/m   Physical Exam  Constitutional: She is oriented to person, place, and time and well-developed, well-nourished, and in no distress. No distress.  Cardiovascular: Normal rate, regular rhythm and normal heart sounds.   Neurological: She is alert and oriented to person, place, and time. GCS score is 15.  Skin: Skin is warm and dry.  2x1 cm area of erythema and induration, warm to touch. Not TTP. No drainage, weeping, bleeding, fluctuance.   Psychiatric: Mood, memory, affect and judgment normal.  Vitals reviewed.     Assessment and Plan :  1. Scratched by cat, initial encounter 2. Erysipelas - amoxicillin-clavulanate (AUGMENTIN) 875-125 MG tablet; Take 1 tablet by mouth 2 (two) times daily.  Dispense: 20 tablet; Refill: 0 - Area was marked with surgical pen. Encouraged pt to keep area clean and dry. RTC if symptoms worsen or so not improve. She agrees with plan.  3. Itching - hydrOXYzine (ATARAX/VISTARIL) 25 MG tablet; Take 1 tablet (25 mg total) by mouth 3 (three) times daily as needed for itching.  Dispense: 30 tablet; Refill: 1    Whitney Luca Dyar, PA-C  [facility] 07/22/2016 2:59 PM

## 2016-07-22 NOTE — Patient Instructions (Addendum)
Start antibiotic if you are not improving or if condition worsens in 1-3 days.  the duration of antibiotic therapy should be tailored to clinical improvement. Cellulitis and skin abscess usually respond to 5 days of therapy; the duration may be extended to up to 14 days if needed Come back if you are not better in 5-10 days.   Thank you for coming in today. I hope you feel we met your needs.  Feel free to call [facility] if you have any questions or further requests.  Please consider signing up for MyChart if you do not already have it, as this is a great way to communicate with me.  Best,  Whitney McVey, PA-C   We recommend that you schedule a mammogram for breast cancer screening. Typically, you do not need a referral to do this. Please contact a local imaging center to schedule your mammogram.  [facility] - [phone]  *ask for the Radiology Department [facility]) - [phone] or [phone]  [facility] - [phone] [place] [phone] [facility] - [phone]  *ask for the [facility] - [phone]  *ask for the Radiology Department [facility] - [phone]  *ask for the [place] [facility] - [phone]  IF you received an x-ray today, you will receive an invoice from [facility]. Please contact [facility] at [phone] with questions or concerns regarding your invoice.   IF you received labwork today, you will receive an invoice from [facility]. Please contact [facility] at [phone] with questions or concerns regarding your invoice.   Our billing staff will not be able to assist you with questions regarding bills from these companies.  You will be contacted with the lab results as soon as they are available. The fastest way to get your results is to activate your My Chart account. Instructions are  located on the last page of this paperwork. If you have not heard from [place] regarding the results in 2 weeks, please contact this office.

## 2016-07-25 ENCOUNTER — Encounter: Payer: Self-pay | Admitting: Physician Assistant

## 2016-08-04 ENCOUNTER — Other Ambulatory Visit: Payer: Self-pay | Admitting: Sports Medicine

## 2016-08-04 ENCOUNTER — Other Ambulatory Visit: Payer: Self-pay | Admitting: *Deleted

## 2016-08-04 MED ORDER — HYDROCODONE-IBUPROFEN 7.5-200 MG PO TABS: 1.0000 | ORAL_TABLET | Freq: Three times a day (TID) | ORAL | 0 refills | Status: DC | PRN

## 2016-08-24 ENCOUNTER — Other Ambulatory Visit: Payer: Self-pay | Admitting: Physician Assistant

## 2016-08-28 ENCOUNTER — Other Ambulatory Visit: Payer: Self-pay | Admitting: Physician Assistant

## 2016-08-28 MED ORDER — DIAZEPAM 5 MG PO TABS: 5.0000 mg | ORAL_TABLET | Freq: Two times a day (BID) | ORAL | 0 refills | Status: DC | PRN

## 2016-08-28 NOTE — Telephone Encounter (Signed)
VALIUM WAS CALLED INTO PHARMACY([facility]) ON 19TH July 2018 AT 2PM BY DD

## 2016-09-03 ENCOUNTER — Other Ambulatory Visit: Payer: Self-pay | Admitting: Sports Medicine

## 2016-09-04 ENCOUNTER — Other Ambulatory Visit: Payer: Self-pay | Admitting: *Deleted

## 2016-09-04 MED ORDER — HYDROCODONE-IBUPROFEN 7.5-200 MG PO TABS: 1.0000 | ORAL_TABLET | Freq: Three times a day (TID) | ORAL | 0 refills | Status: DC | PRN

## 2016-10-07 ENCOUNTER — Other Ambulatory Visit: Payer: Self-pay | Admitting: Sports Medicine

## 2016-10-09 MED ORDER — HYDROCODONE-IBUPROFEN 7.5-200 MG PO TABS: 1.0000 | ORAL_TABLET | Freq: Three times a day (TID) | ORAL | 0 refills | Status: DC | PRN

## 2016-10-16 ENCOUNTER — Encounter: Payer: Self-pay | Admitting: Physician Assistant

## 2016-10-16 MED ORDER — CLOTRIMAZOLE-BETAMETHASONE 1-0.05 % EX CREA: 1.0000 "application " | TOPICAL_CREAM | Freq: Two times a day (BID) | CUTANEOUS | 2 refills | Status: DC
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
## 2016-11-03 ENCOUNTER — Ambulatory Visit (INDEPENDENT_AMBULATORY_CARE_PROVIDER_SITE_OTHER): Payer: BLUE CROSS/BLUE SHIELD | Admitting: Physician Assistant

## 2016-11-03 ENCOUNTER — Encounter: Payer: Self-pay | Admitting: Physician Assistant

## 2016-11-03 VITALS — BP 126/80 | HR 81 | Temp 97.5°F | Resp 16 | Ht 66.5 in | Wt 167.4 lb

## 2016-11-03 DIAGNOSIS — R6889 Other general symptoms and signs: Secondary | ICD-10-CM | POA: Diagnosis not present

## 2016-11-03 DIAGNOSIS — K219 Gastro-esophageal reflux disease without esophagitis: Secondary | ICD-10-CM

## 2016-11-03 DIAGNOSIS — Z79899 Other long term (current) drug therapy: Secondary | ICD-10-CM | POA: Diagnosis not present

## 2016-11-03 DIAGNOSIS — Z0001 Encounter for general adult medical examination with abnormal findings: Secondary | ICD-10-CM

## 2016-11-03 DIAGNOSIS — I10 Essential (primary) hypertension: Secondary | ICD-10-CM | POA: Diagnosis not present

## 2016-11-03 DIAGNOSIS — F419 Anxiety disorder, unspecified: Secondary | ICD-10-CM

## 2016-11-03 DIAGNOSIS — R5383 Other fatigue: Secondary | ICD-10-CM | POA: Diagnosis not present

## 2016-11-03 DIAGNOSIS — M5412 Radiculopathy, cervical region: Secondary | ICD-10-CM

## 2016-11-03 DIAGNOSIS — Z136 Encounter for screening for cardiovascular disorders: Secondary | ICD-10-CM

## 2016-11-03 DIAGNOSIS — E559 Vitamin D deficiency, unspecified: Secondary | ICD-10-CM | POA: Diagnosis not present

## 2016-11-03 DIAGNOSIS — G4700 Insomnia, unspecified: Secondary | ICD-10-CM

## 2016-11-03 DIAGNOSIS — R7303 Prediabetes: Secondary | ICD-10-CM | POA: Diagnosis not present

## 2016-11-03 DIAGNOSIS — Z13220 Encounter for screening for lipoid disorders: Secondary | ICD-10-CM

## 2016-11-03 DIAGNOSIS — F3341 Major depressive disorder, recurrent, in partial remission: Secondary | ICD-10-CM

## 2016-11-03 DIAGNOSIS — Z0000 Encounter for general adult medical examination without abnormal findings: Secondary | ICD-10-CM | POA: Diagnosis not present

## 2016-11-03 DIAGNOSIS — Z1159 Encounter for screening for other viral diseases: Secondary | ICD-10-CM

## 2016-11-03 DIAGNOSIS — Z131 Encounter for screening for diabetes mellitus: Secondary | ICD-10-CM

## 2016-11-03 DIAGNOSIS — K1379 Other lesions of oral mucosa: Secondary | ICD-10-CM

## 2016-11-03 NOTE — Patient Instructions (Signed)
Call Phone: [phone]; Call Dr. Henrene Pastor  Follow up dentist Canker Sores Canker sores are small, painful sores that develop inside your mouth. They may also be called aphthous ulcers. You can get canker sores on the inside of your lips or cheeks, on your tongue, or anywhere inside your mouth. You can have just one canker sore or several of them. Canker sores cannot be passed from one person to another (noncontagious). These sores are different than the sores that you may get on the outside of your lips (cold sores or fever blisters). Canker sores usually start as painful red bumps. Then they turn into small white, yellow, or gray ulcers that have red borders. The ulcers may be quite painful. The pain may be worse when you eat or drink. What are the causes? The cause of this condition is not known. What increases the risk? This condition is more likely to develop in:  Women.  People in their teens or 50s.  Women who are having their menstrual period.  People who are under a lot of emotional stress.  People who do not get enough iron or B vitamins.  People who have poor oral hygiene.  People who have an injury inside the mouth. This can happen after having dental work or from chewing something hard.  What are the signs or symptoms? Along with the canker sore, symptoms may also include:  Fever.  Fatigue.  Swollen lymph nodes in your neck.  How is this diagnosed? This condition can be diagnosed based on your symptoms. Your health care provider will also examine your mouth. Your health care provider may also do tests if you get canker sores often or if they are very bad. Tests may include:  Blood tests to rule out other causes of canker sores.  Taking swabs from the sore to check for infection.  Taking a small piece of skin from the sore (biopsy) to test it for cancer.  How is this treated? Most canker sores clear up without treatment in about 10 days. Home care is usually the  only treatment that you will need. Over-the-counter medicines can relieve discomfort.If you have severe canker sores, your health care provider may prescribe:  Numbing ointment to relieve pain.  Vitamins.  Steroid medicines. These may be given as: ? Oral pills. ? Mouth rinses. ? Gels.  Antibiotic mouth rinse.  Follow these instructions at home:  Apply, take, or use medicines only as directed by your health care provider. These include vitamins.  If you were prescribed an antibiotic mouth rinse, finish all of it even if you start to feel better.  Until the sores are healed: ? Do not drink coffee or citrus juices. ? Do not eat spicy or salty foods.  Use a mild, over-the-counter mouth rinse as directed by your health care provider.  Practice good oral hygiene. ? Floss your teeth every day. ? Brush your teeth with a soft brush twice each day. Contact a health care provider if:  Your symptoms do not get better after two weeks.  You also have a fever or swollen glands.  You get canker sores often.  You have a canker sore that is getting larger.  You cannot eat or drink due to your canker sores. This information is not intended to replace advice given to you by your health care provider. Make sure you discuss any questions you have with your health care provider. Document Released: 05/24/2010 Document Revised: 07/05/2015 Document Reviewed: 12/28/2013 Elsevier Interactive Patient Education  2018 Elsevier [employer]   Fatigue Fatigue is feeling tired all of the time, a lack of energy, or a lack of motivation. Occasional or mild fatigue is often a normal response to activity or life in general. However, long-lasting (chronic) or extreme fatigue may indicate an underlying medical condition. Follow these instructions at home: Watch your fatigue for any changes. The following actions may help to lessen any discomfort you are feeling:  Talk to your health care provider about how much  sleep you need each night. Try to get the required amount every night.  Take medicines only as directed by your health care provider.  Eat a healthy and nutritious diet. Ask your health care provider if you need help changing your diet.  Drink enough fluid to keep your urine clear or pale yellow.  Practice ways of relaxing, such as yoga, meditation, massage therapy, or acupuncture.  Exercise regularly.  Change situations that cause you stress. Try to keep your work and personal routine reasonable.  Do not abuse illegal drugs.  Limit alcohol intake to no more than 1 drink per day for nonpregnant women and 2 drinks per day for men. One drink equals 12 ounces of beer, 5 ounces of wine, or 1 ounces of hard liquor.  Take a multivitamin, if directed by your health care provider.  Contact a health care provider if:  Your fatigue does not get better.  You have a fever.  You have unintentional weight loss or gain.  You have headaches.  You have difficulty: ? Falling asleep. ? Sleeping throughout the night.  You feel angry, guilty, anxious, or sad.  You are unable to have a bowel movement (constipation).  You skin is dry.  Your legs or another part of your body is swollen. Get help right away if:  You feel confused.  Your vision is blurry.  You feel faint or pass out.  You have a severe headache.  You have severe abdominal, pelvic, or back pain.  You have chest pain, shortness of breath, or an irregular or fast heartbeat.  You are unable to urinate or you urinate less than normal.  You develop abnormal bleeding, such as bleeding from the rectum, vagina, nose, lungs, or nipples.  You vomit blood.  You have thoughts about harming yourself or committing suicide.  You are worried that you might harm someone else. This information is not intended to replace advice given to you by your health care provider. Make sure you discuss any questions you have with your health  care provider. Document Released: 11/24/2006 Document Revised: 07/05/2015 Document Reviewed: 05/31/2013 Elsevier Interactive Patient Education  [employer].

## 2016-11-05 LAB — HEPATITIS C ANTIBODY
HEP C AB: NONREACTIVE
SIGNAL TO CUT-OFF: 0.01 (ref <1.00)

## 2016-11-05 LAB — HEMOGLOBIN A1C
HEMOGLOBIN A1C: 5.3 %{Hb} (ref <5.7)
MEAN PLASMA GLUCOSE: 105 (calc)
eAG (mmol/L): 5.8 (calc)

## 2016-11-05 LAB — ROCKY MTN SPOTTED FVR ABS PNL(IGG+IGM)
RMSF IGM: NOT DETECTED
RMSF IgG: NOT DETECTED

## 2016-11-05 LAB — CBC WITH DIFFERENTIAL/PLATELET
BASOS PCT: 0.7 %
Basophils Absolute: 39 cells/uL (ref 0–200)
EOS PCT: 7.1 %
Eosinophils Absolute: 398 cells/uL (ref 15–500)
HCT: 38.5 % (ref 35.0–45.0)
Hemoglobin: 13.1 g/dL (ref 11.7–15.5)
Lymphs Abs: 1540 cells/uL (ref 850–3900)
MCH: 30.4 pg (ref 27.0–33.0)
MCHC: 34 g/dL (ref 32.0–36.0)
MCV: 89.3 fL (ref 80.0–100.0)
MONOS PCT: 9.6 %
MPV: 9.5 fL (ref 7.5–12.5)
Neutro Abs: 3086 cells/uL (ref 1500–7800)
Neutrophils Relative %: 55.1 %
PLATELETS: 296 10*3/uL (ref 140–400)
RBC: 4.31 10*6/uL (ref 3.80–5.10)
RDW: 11.8 % (ref 11.0–15.0)
TOTAL LYMPHOCYTE: 27.5 %
WBC mixed population: 538 cells/uL (ref 200–950)
WBC: 5.6 10*3/uL (ref 3.8–10.8)

## 2016-11-05 LAB — HEPATIC FUNCTION PANEL
AG Ratio: 1.7 (calc) (ref 1.0–2.5)
ALBUMIN MSPROF: 4.4 g/dL (ref 3.6–5.1)
ALT: 20 U/L (ref 6–29)
AST: 19 U/L (ref 10–35)
Alkaline phosphatase (APISO): 65 U/L (ref 33–130)
Bilirubin, Direct: 0.1 mg/dL (ref 0.0–0.2)
GLOBULIN: 2.6 g/dL (ref 1.9–3.7)
Indirect Bilirubin: 0.5 mg/dL (calc) (ref 0.2–1.2)
Total Bilirubin: 0.6 mg/dL (ref 0.2–1.2)
Total Protein: 7 g/dL (ref 6.1–8.1)

## 2016-11-05 LAB — LIPID PANEL
CHOL/HDL RATIO: 3 (calc) (ref <5.0)
Cholesterol: 220 mg/dL — ABNORMAL HIGH (ref <200)
HDL: 74 mg/dL (ref >50)
LDL CHOLESTEROL (CALC): 125 mg/dL — AB
NON-HDL CHOLESTEROL (CALC): 146 mg/dL — AB (ref <130)
TRIGLYCERIDES: 103 mg/dL (ref <150)

## 2016-11-05 LAB — VITAMIN D 25 HYDROXY (VIT D DEFICIENCY, FRACTURES): VIT D 25 HYDROXY: 49 ng/mL (ref 30–100)

## 2016-11-05 LAB — IRON, TOTAL/TOTAL IRON BINDING CAP
%SAT: 34 % (calc) (ref 11–50)
Iron: 117 ug/dL (ref 45–160)
TIBC: 346 mcg/dL (calc) (ref 250–450)

## 2016-11-05 LAB — BASIC METABOLIC PANEL WITH GFR
BUN: 23 mg/dL (ref 7–25)
CALCIUM: 9.8 mg/dL (ref 8.6–10.4)
CHLORIDE: 106 mmol/L (ref 98–110)
CO2: 27 mmol/L (ref 20–32)
Creat: 0.82 mg/dL (ref 0.50–1.05)
GFR, Est African American: 91 mL/min/{1.73_m2} (ref >=60)
GFR, Est Non African American: 78 mL/min/{1.73_m2} (ref >=60)
GLUCOSE: 97 mg/dL (ref 65–99)
Potassium: 4.4 mmol/L (ref 3.5–5.3)
Sodium: 139 mmol/L (ref 135–146)

## 2016-11-05 LAB — B. BURGDORFI ANTIBODIES: B burgdorferi Ab IgG+IgM: <0.9 index

## 2016-11-05 LAB — MICROALBUMIN / CREATININE URINE RATIO
Creatinine, Urine: 106 mg/dL (ref 20–275)
Microalb Creat Ratio: 6 mcg/mg creat (ref <30)
Microalb, Ur: 0.6 mg/dL

## 2016-11-05 LAB — VITAMIN B12: Vitamin B-12: 403 pg/mL (ref 200–1100)

## 2016-11-05 LAB — TSH: TSH: 1.71 mIU/L (ref 0.40–4.50)

## 2016-11-05 LAB — FERRITIN: FERRITIN: 161 ng/mL (ref 10–232)

## 2016-11-05 LAB — HIV ANTIBODY (ROUTINE TESTING W REFLEX): HIV 1&2 Ab, 4th Generation: NONREACTIVE

## 2016-11-05 LAB — MAGNESIUM: Magnesium: 2.2 mg/dL (ref 1.5–2.5)

## 2016-11-05 LAB — ZINC: Zinc: 87 ug/dL (ref 60–130)

## 2016-11-05 LAB — RPR: RPR: NONREACTIVE

## 2016-11-14 ENCOUNTER — Encounter: Payer: Self-pay | Admitting: Physician Assistant

## 2016-11-14 ENCOUNTER — Other Ambulatory Visit: Payer: Self-pay | Admitting: Physician Assistant

## 2016-11-14 MED ORDER — DEXLANSOPRAZOLE 60 MG PO CPDR: 60.0000 mg | DELAYED_RELEASE_CAPSULE | Freq: Every day | ORAL | 4 refills | Status: DC

## 2016-11-20 ENCOUNTER — Other Ambulatory Visit: Payer: Self-pay | Admitting: *Deleted

## 2016-11-20 ENCOUNTER — Other Ambulatory Visit: Payer: Self-pay | Admitting: Sports Medicine

## 2016-11-20 MED ORDER — HYDROCODONE-IBUPROFEN 7.5-200 MG PO TABS: 1.0000 | ORAL_TABLET | Freq: Three times a day (TID) | ORAL | 0 refills | Status: DC | PRN

## 2016-12-21 ENCOUNTER — Other Ambulatory Visit (INDEPENDENT_AMBULATORY_CARE_PROVIDER_SITE_OTHER): Payer: Self-pay

## 2016-12-21 ENCOUNTER — Other Ambulatory Visit: Payer: Self-pay | Admitting: Physician Assistant

## 2016-12-22 ENCOUNTER — Encounter: Payer: Self-pay | Admitting: *Deleted

## 2016-12-22 MED ORDER — DIAZEPAM 5 MG PO TABS: 5.0000 mg | ORAL_TABLET | Freq: Two times a day (BID) | ORAL | 0 refills | Status: DC | PRN

## 2016-12-22 NOTE — Telephone Encounter (Signed)
Valium has been called into pharmacy on Nov 12th 2018 by DD

## 2016-12-24 ENCOUNTER — Other Ambulatory Visit: Payer: Self-pay

## 2016-12-24 MED ORDER — HYDROCODONE-IBUPROFEN 7.5-200 MG PO TABS: 1.0000 | ORAL_TABLET | Freq: Three times a day (TID) | ORAL | 0 refills | Status: DC | PRN

## 2017-01-06 ENCOUNTER — Encounter: Payer: Self-pay | Admitting: *Deleted

## 2017-01-09 ENCOUNTER — Encounter: Payer: Self-pay | Admitting: Internal Medicine

## 2017-01-09 ENCOUNTER — Ambulatory Visit (INDEPENDENT_AMBULATORY_CARE_PROVIDER_SITE_OTHER): Payer: BLUE CROSS/BLUE SHIELD | Admitting: Internal Medicine

## 2017-01-09 ENCOUNTER — Other Ambulatory Visit: Payer: Worker's Compensation

## 2017-01-09 VITALS — BP 124/86 | HR 84 | Ht 65.25 in | Wt 172.2 lb

## 2017-01-09 DIAGNOSIS — R197 Diarrhea, unspecified: Secondary | ICD-10-CM | POA: Diagnosis not present

## 2017-01-09 DIAGNOSIS — Z86010 Personal history of colonic polyps: Secondary | ICD-10-CM | POA: Diagnosis not present

## 2017-01-09 DIAGNOSIS — K219 Gastro-esophageal reflux disease without esophagitis: Secondary | ICD-10-CM

## 2017-01-09 MED ORDER — NA SULFATE-K SULFATE-MG SULF 17.5-3.13-1.6 GM/177ML PO SOLN: 1.0000 | Freq: Once | ORAL | 0 refills | Status: AC
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
## 2017-01-09 NOTE — Patient Instructions (Signed)
Your physician has requested that you go to the basement for the following lab work before leaving today:  GI Pathogen Panel  Use Imodium as needed  You have been scheduled for an endoscopy and colonoscopy. Please follow the written instructions given to you at your visit today. Please pick up your prep supplies at the pharmacy within the next 1-3 days. If you use inhalers (even only as needed), please bring them with you on the day of your procedure. Your physician has requested that you go to www.[email or web address].[email or web address] and enter the access code given to you at your visit today. This web site gives a general overview about your procedure. However, you should still follow specific instructions given to you by our office regarding your preparation for the procedure.

## 2017-01-15 ENCOUNTER — Encounter: Payer: Self-pay | Admitting: Internal Medicine

## 2017-01-20 IMAGING — US US TRANSVAGINAL NON-OB
1 series · 13 of 25 positions shown · non-contrast
Comparison: 05/29/2009 and 11/20/2014 pelvic ultrasound studies.

CLINICAL DATA: 59-year-old postmenopausal female presenting for
follow-up of left adnexal cyst.

EXAM:
TRANSABDOMINAL AND TRANSVAGINAL ULTRASOUND OF PELVIS
TECHNIQUE: Both transabdominal and transvaginal ultrasound examinations of the
pelvis were performed. Transabdominal technique was performed for
global imaging of the pelvis including uterus, ovaries, adnexal
regions, and pelvic cul-de-sac. It was necessary to proceed with
endovaginal exam following the transabdominal exam to visualize the
endometrium and adnexa.

[Series 1: us transvaginal non-ob · 0.20mm/px · 13 of 83 slices shown]
[im 1/83]
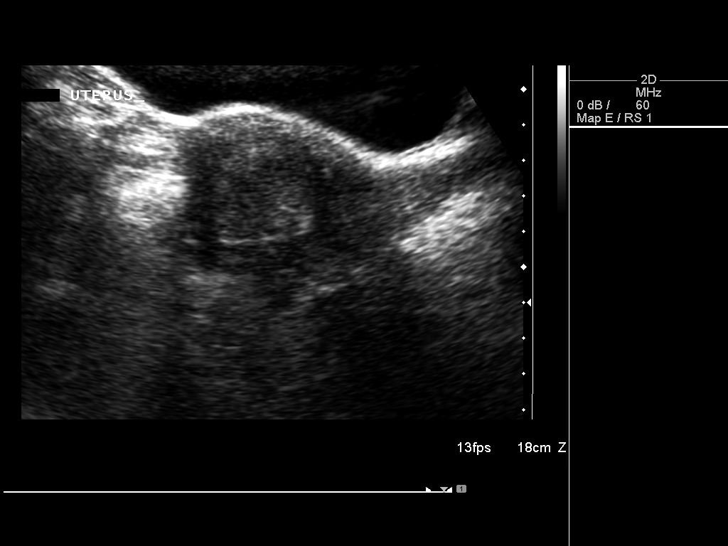
[im 7/83]
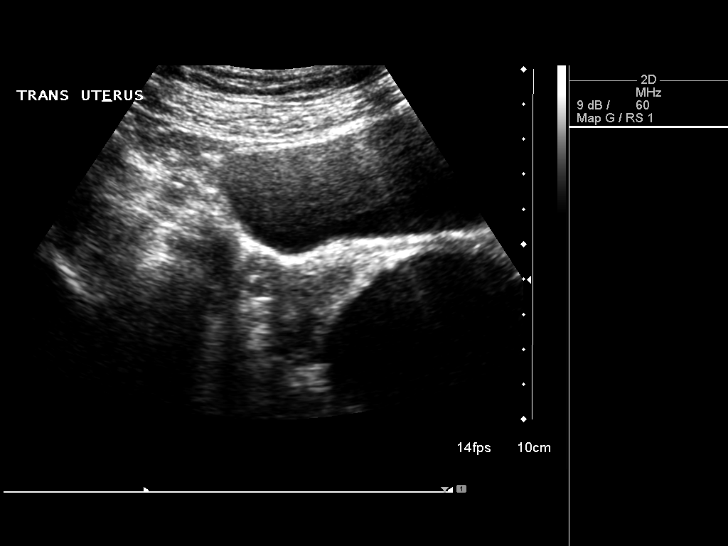
[im 14/83]
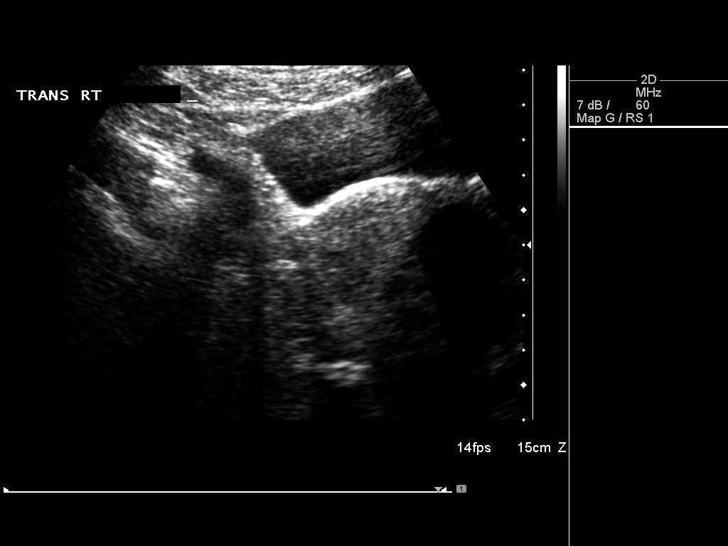
[im 21/83]
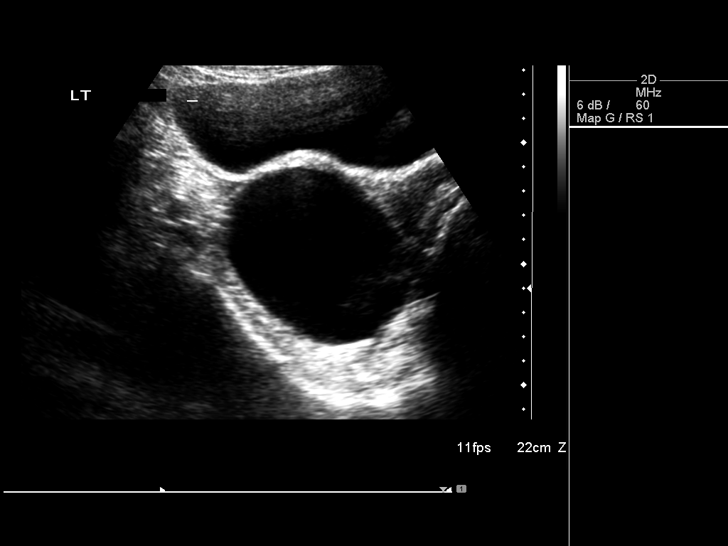
[im 28/83]
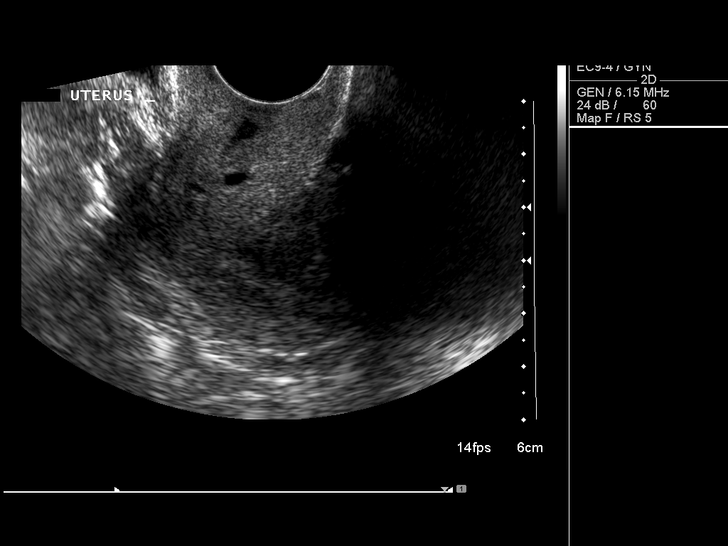
[im 35/83]
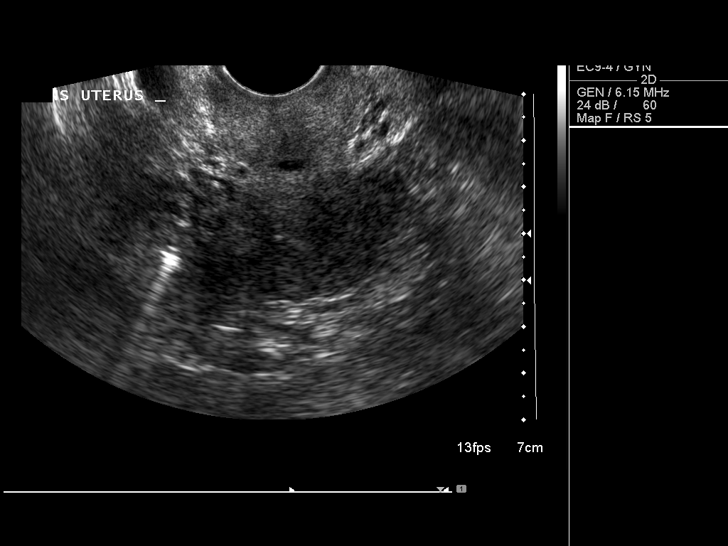
[im 42/83]
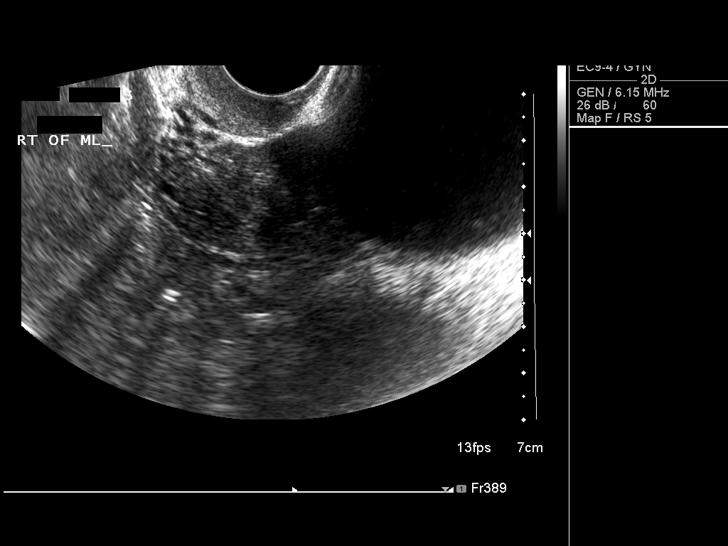
[im 48/83]
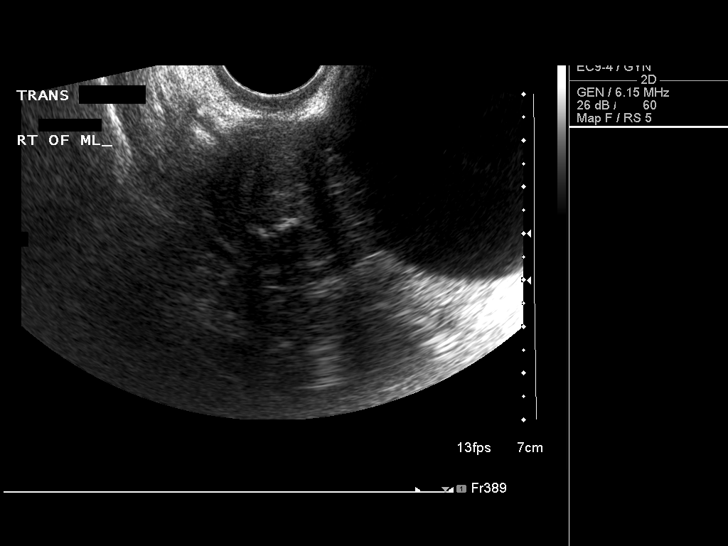
[im 55/83]
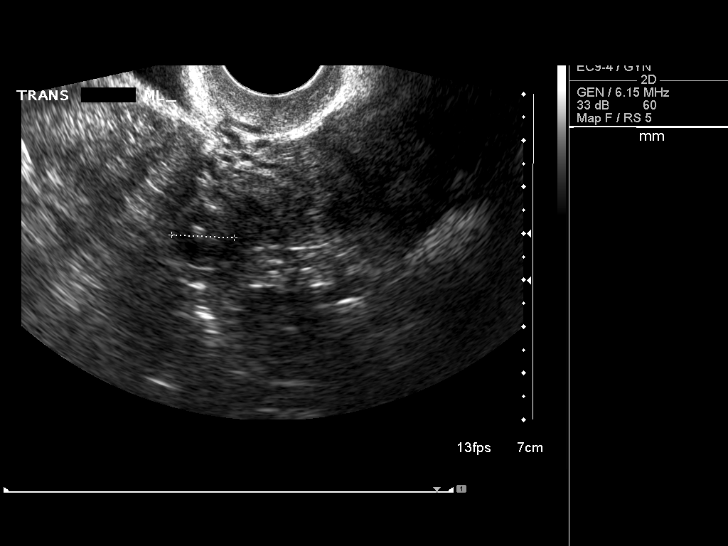
[im 62/83]
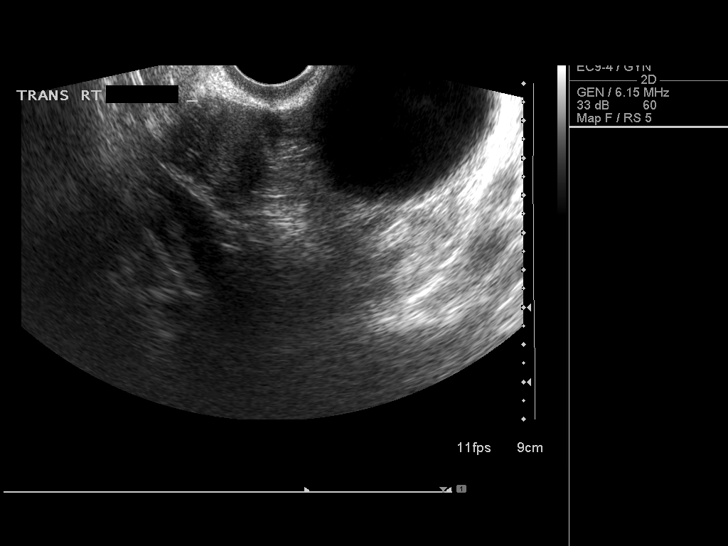
[im 69/83]
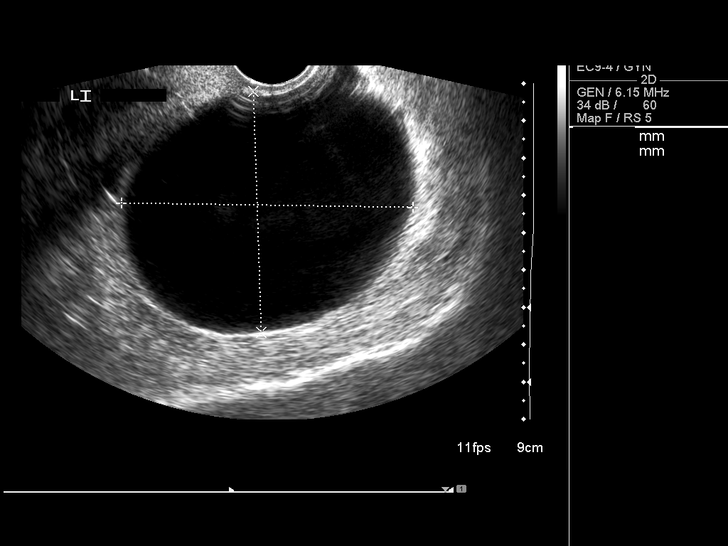
[im 76/83]
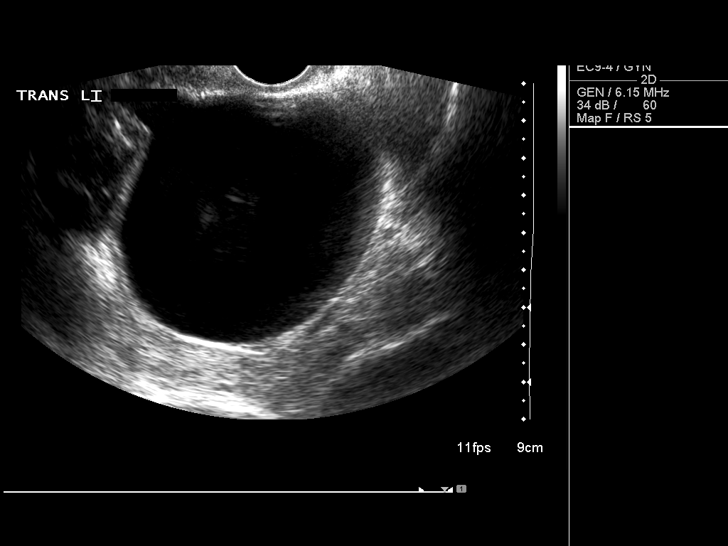
[im 83/83]
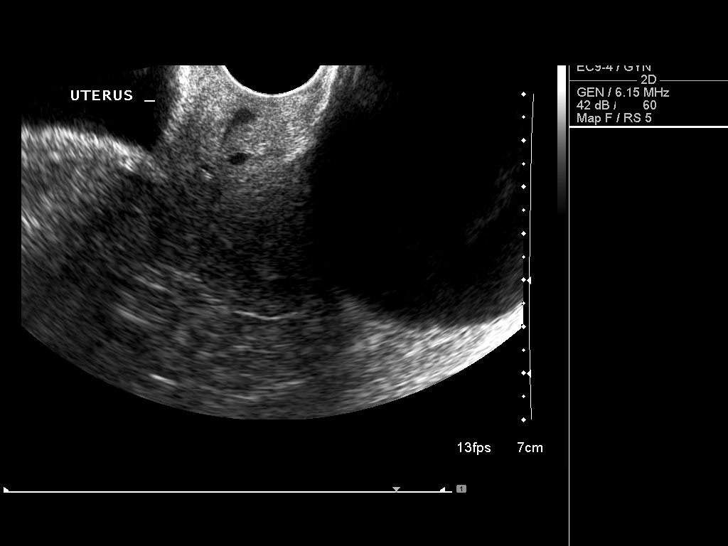

[13 of 25 positions shown; findings below may reference images not displayed]

FINDINGS: Uterus

Measurements: 7.1 x 3.5 x 4.0 cm. The retroverted retroflexed uterus
is mildly enlarged by fibroids as follows:

- right fundal subserosal 2.4 x 2.4 x 2.7 cm fibroid with mild
partial internal calcific degeneration, not previously described

- right fundal exophytic coarsely calcified 3.0 x 3.1 x 3.0 cm
fibroid, previously 2.9 x 3.5 x 2.7 cm, not appreciably changed

- right lateral fundal subserosal 1.4 x 1.4 x 1.4 cm fibroid with
partial internal calcific degeneration, not previously described

Endometrium

Thickness: 4 mm. No abnormal thickening, focal endometrial lesion or
endometrial cavity fluid.

Right ovary

Right ovary is not visualized.  No right adnexal mass is detected.

Left ovary

Re- demonstrated is a simple appearing 7.8 x 6.5 x 7.0 cm left
adnexal cystic structure with no internal septations, internal
vascularity or solid mural elements demonstrated, previously 7.7 x
5.8 x 6.6 cm, slightly increased. A normal left ovary is not
detected.

Other findings

No abnormal free fluid in the pelvis.
IMPRESSION: 1. Simple-appearing 7.8 x 6.5 x 7.0 cm left adnexal cystic
structure, slightly increased compared to the pelvic sonogram for 1
month prior, with no normal left ovary detected. Although this left
adnexal cyst lacks overtly aggressive features and is likely to
represent a left ovarian serous cystadenoma, given the large size
and interval growth on short-term follow-up, surgical excision is
warranted.
2. Nonvisualization of the right ovary.  No right adnexal mass.
3. Mildly enlarged myomatous uterus.  No endometrial abnormality.
4. No abnormal free fluid in the pelvis.

## 2017-01-23 ENCOUNTER — Telehealth: Payer: Self-pay | Admitting: Internal Medicine

## 2017-01-23 NOTE — Telephone Encounter (Signed)
Left message for patient to call back and scheduled ECL and previsit per office visit notes on 01/09/17.

## 2017-01-29 ENCOUNTER — Encounter: Payer: Self-pay | Admitting: *Deleted

## 2017-02-03 ENCOUNTER — Other Ambulatory Visit (INDEPENDENT_AMBULATORY_CARE_PROVIDER_SITE_OTHER): Payer: Self-pay

## 2017-02-05 ENCOUNTER — Other Ambulatory Visit (INDEPENDENT_AMBULATORY_CARE_PROVIDER_SITE_OTHER): Payer: Self-pay

## 2017-02-09 MED ORDER — HYDROCODONE-IBUPROFEN 7.5-200 MG PO TABS: 1.0000 | ORAL_TABLET | Freq: Three times a day (TID) | ORAL | 0 refills | Status: DC | PRN

## 2017-02-13 ENCOUNTER — Other Ambulatory Visit: Payer: Self-pay | Admitting: *Deleted

## 2017-02-13 MED ORDER — HYDROCODONE-IBUPROFEN 7.5-200 MG PO TABS: 1.0000 | ORAL_TABLET | Freq: Three times a day (TID) | ORAL | 0 refills | Status: DC | PRN

## 2017-02-17 ENCOUNTER — Other Ambulatory Visit (INDEPENDENT_AMBULATORY_CARE_PROVIDER_SITE_OTHER): Payer: Self-pay

## 2017-02-18 ENCOUNTER — Other Ambulatory Visit: Payer: Self-pay | Admitting: Physician Assistant

## 2017-02-18 MED ORDER — DULOXETINE HCL 60 MG PO CPEP: ORAL_CAPSULE | ORAL | 2 refills | Status: DC
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
## 2017-03-02 ENCOUNTER — Encounter: Payer: Self-pay | Admitting: Physician Assistant

## 2017-03-02 ENCOUNTER — Ambulatory Visit: Payer: BLUE CROSS/BLUE SHIELD | Admitting: Physician Assistant

## 2017-03-02 VITALS — BP 130/80 | HR 82 | Temp 97.6°F | Resp 14 | Ht 60.25 in | Wt 175.0 lb

## 2017-03-02 DIAGNOSIS — K219 Gastro-esophageal reflux disease without esophagitis: Secondary | ICD-10-CM

## 2017-03-02 DIAGNOSIS — I10 Essential (primary) hypertension: Secondary | ICD-10-CM | POA: Diagnosis not present

## 2017-03-02 DIAGNOSIS — Z79899 Other long term (current) drug therapy: Secondary | ICD-10-CM

## 2017-03-02 DIAGNOSIS — F3341 Major depressive disorder, recurrent, in partial remission: Secondary | ICD-10-CM

## 2017-03-02 MED ORDER — PANTOPRAZOLE SODIUM 40 MG PO TBEC: 40.0000 mg | DELAYED_RELEASE_TABLET | Freq: Every day | ORAL | 1 refills | Status: DC

## 2017-03-02 NOTE — Patient Instructions (Addendum)
Take protonix, want 30 mins before food in the AM Can take the pepcid at night if you need to   Try the exercises and other information in the back care manual.   You can take tylenol (500mg ) or tylenol arthritis (650mg ) with the meloxicam/antiinflammatories. The max you can take of tylenol a day is 3000mg  daily, this is a max of 6 pills a day of the regular tyelnol (500mg ) or a max of 4 a day of the tylenol arthritis (650mg ) as long as no other medications you are taking contain tylenol.  Can do salonpaus over the counter  Go to the ER if you have any new weakness in your legs, have trouble controlling your urine or bowels, or have worsening pain.   If you are not better in 1-3 month we will refer you to ortho   Back pain Rehab Ask your health care provider which exercises are safe for you. Do exercises exactly as told by your health care provider and adjust them as directed. It is normal to feel mild stretching, pulling, tightness, or discomfort as you do these exercises, but you should stop right away if you feel sudden pain or your pain gets worse.Do not begin these exercises until told by your health care provider. Stretching and range of motion exercises These exercises warm up your muscles and joints and improve the movement and flexibility of your hips and your back. These exercises also help to relieve pain, numbness, and tingling. Exercise A: Sciatic nerve glide 1. Sit in a chair with your head facing down toward your chest. Place your hands behind your back. Let your shoulders slump forward. 2. Slowly straighten one of your knees while you tilt your head back as if you are looking toward the ceiling. Only straighten your leg as far as you can without making your symptoms worse. 3. Hold for __________ seconds. 4. Slowly return to the starting position. 5. Repeat with your other leg. Repeat __________ times. Complete this exercise __________ times a day. Exercise B: Knee to chest  with hip adduction and internal rotation  1. Lie on your back on a firm surface with both legs straight. 2. Bend one of your knees and move it up toward your chest until you feel a gentle stretch in your lower back and buttock. Then, move your knee toward the shoulder that is on the opposite side from your leg. ? Hold your leg in this position by holding onto the front of your knee. 3. Hold for __________ seconds. 4. Slowly return to the starting position. 5. Repeat with your other leg. Repeat __________ times. Complete this exercise __________ times a day. Exercise C: Prone extension on elbows  1. Lie on your abdomen on a firm surface. A bed may be too soft for this exercise. 2. Prop yourself up on your elbows. 3. Use your arms to help lift your chest up until you feel a gentle stretch in your abdomen and your lower back. ? This will place some of your body weight on your elbows. If this is uncomfortable, try stacking pillows under your chest. ? Your hips should stay down, against the surface that you are lying on. Keep your hip and back muscles relaxed. 4. Hold for __________ seconds. 5. Slowly relax your upper body and return to the starting position. Repeat __________ times. Complete this exercise __________ times a day. Strengthening exercises These exercises build strength and endurance in your back. Endurance is the ability to use your muscles  for a long time, even after they get tired. Exercise D: Pelvic tilt 1. Lie on your back on a firm surface. Bend your knees and keep your feet flat. 2. Tense your abdominal muscles. Tip your pelvis up toward the ceiling and flatten your lower back into the floor. ? To help with this exercise, you may place a small towel under your lower back and try to push your back into the towel. 3. Hold for __________ seconds. 4. Let your muscles relax completely before you repeat this exercise. Repeat __________ times. Complete this exercise __________  times a day. Exercise E: Alternating arm and leg raises  1. Get on your hands and knees on a firm surface. If you are on a hard floor, you may want to use padding to cushion your knees, such as an exercise mat. 2. Line up your arms and legs. Your hands should be below your shoulders, and your knees should be below your hips. 3. Lift your left leg behind you. At the same time, raise your right arm and straighten it in front of you. ? Do not lift your leg higher than your hip. ? Do not lift your arm higher than your shoulder. ? Keep your abdominal and back muscles tight. ? Keep your hips facing the ground. ? Do not arch your back. ? Keep your balance carefully, and do not hold your breath. 4. Hold for __________ seconds. 5. Slowly return to the starting position and repeat with your right leg and your left arm. Repeat __________ times. Complete this exercise __________ times a day. Posture and body mechanics  Body mechanics refers to the movements and positions of your body while you do your daily activities. Posture is part of body mechanics. Good posture and healthy body mechanics can help to relieve stress in your body's tissues and joints. Good posture means that your spine is in its natural S-curve position (your spine is neutral), your shoulders are pulled back slightly, and your head is not tipped forward. The following are general guidelines for applying improved posture and body mechanics to your everyday activities. Standing   When standing, keep your spine neutral and your feet about hip-width apart. Keep a slight bend in your knees. Your ears, shoulders, and hips should line up.  When you do a task in which you stand in one place for a long time, place one foot up on a stable object that is 2-4 inches (5-10 cm) high, such as a footstool. This helps keep your spine neutral. Sitting   When sitting, keep your spine neutral and keep your feet flat on the floor. Use a footrest, if  necessary, and keep your thighs parallel to the floor. Avoid rounding your shoulders, and avoid tilting your head forward.  When working at a desk or a computer, keep your desk at a height where your hands are slightly lower than your elbows. Slide your chair under your desk so you are close enough to maintain good posture.  When working at a computer, place your monitor at a height where you are looking straight ahead and you do not have to tilt your head forward or downward to look at the screen. Resting   When lying down and resting, avoid positions that are most painful for you.  If you have pain with activities such as sitting, bending, stooping, or squatting (flexion-based activities), lie in a position in which your body does not bend very much. For example, avoid curling up on your  side with your arms and knees near your chest (fetal position).  If you have pain with activities such as standing for a long time or reaching with your arms (extension-based activities), lie with your spine in a neutral position and bend your knees slightly. Try the following positions: ? Lying on your side with a pillow between your knees. ? Lying on your back with a pillow under your knees. Lifting   When lifting objects, keep your feet at least shoulder-width apart and tighten your abdominal muscles.  Bend your knees and hips and keep your spine neutral. It is important to lift using the strength of your legs, not your back. Do not lock your knees straight out.  Always ask for help to lift heavy or awkward objects. This information is not intended to replace advice given to you by your health care provider. Make sure you discuss any questions you have with your health care provider. Document Released: 01/27/2005 Document Revised: 10/04/2015 Document Reviewed: 10/13/2014 Elsevier Interactive Patient Education  2018 [employer].    Helicobacter Pylori Infection Helicobacter pylori infection is an  infection in the stomach that is caused by the Helicobacter pylori (H. pylori) bacteria. This type of bacteria often lives in the lining of the stomach. The infection can cause ulcers and irritation (gastritis) in some people. It is the most common cause of ulcers in the stomach (gastric ulcer) and in the upper part of the intestine (duodenal ulcer). Having this infection may also increase the risk of stomach cancer and a type of white blood cell cancer (lymphoma) that affects the stomach. What are the causes? H. pylori is a type of bacteria that is often found in the stomachs of healthy people. The bacteria may be passed from person to person through contact with stool or saliva. It is not known why some people develop ulcers, gastritis, or cancer from the infection. What increases the risk? This condition is more likely to develop in people who:  Have family members with the infection.  Live with many other people, such as in a dormitory.  Are of African, Hispanic, or Asian descent.  What are the signs or symptoms? Most people with this infection do not have symptoms. If you do have symptoms, they may include:  Heartburn.  Stomach pain.  Nausea.  Vomiting.  Blood-tinged vomit.  Loss of appetite.  Bad breath.  How is this diagnosed? This condition may be diagnosed based on your symptoms, a physical exam, and various tests. Tests may include:  Blood tests or stool tests to check for the proteins (antibodies) that your body may produce in response to the bacteria. These tests are the best way to confirm the diagnosis.  A breath test to check for the type of gas that the H. pylori bacteria release after breaking down a substance called urea. For the test, you are asked to drink urea. This test is often done after treatment in order to find out if the treatment worked.  A procedure in which a thin, flexible tube with a tiny camera at the end is placed into your stomach and upper  intestine (upper endoscopy). Your health care provider may also take tissue samples (biopsy) to test for H. pylori and cancer.  How is this treated? Treatment for this condition usually involves taking a combination of medicines (triple therapy) for a couple of weeks. Triple therapy includes one medicine to reduce the acid in your stomach and two types of antibiotic medicines. Many drug combinations  have been approved for treatment. Treatment usually kills the H. pylori and reduces your risk of cancer. You may need to be tested for H. pylori again after treatment. In some cases, the treatment may need to be repeated. Follow these instructions at home:  Take over-the-counter and prescription medicines only as told by your health care provider.  Take your antibiotics as told by your health care provider. Do not stop taking the antibiotics even if you start to feel better.  You can do all your usual activities and eat what you usually do.  Take steps to prevent future infections: ? Wash your hands often. ? Make sure the food you eat has been properly prepared. ? Drink water only from clean sources.  Keep all follow-up visits as told by your health care provider. This is important. Contact a health care provider if:  Your symptoms do not get better.  Your symptoms return after treatment. This information is not intended to replace advice given to you by your health care provider. Make sure you discuss any questions you have with your health care provider. Document Released: 05/21/2015 Document Revised: 07/05/2015 Document Reviewed: 02/08/2014 Elsevier Interactive Patient Education  2018 [employer].

## 2017-03-17 ENCOUNTER — Other Ambulatory Visit (INDEPENDENT_AMBULATORY_CARE_PROVIDER_SITE_OTHER): Payer: Self-pay

## 2017-03-17 MED ORDER — HYDROCODONE-IBUPROFEN 7.5-200 MG PO TABS: 1.0000 | ORAL_TABLET | Freq: Three times a day (TID) | ORAL | 0 refills | Status: DC | PRN

## 2017-04-01 ENCOUNTER — Encounter: Payer: Self-pay | Admitting: Physician Assistant

## 2017-04-01 ENCOUNTER — Ambulatory Visit: Payer: BLUE CROSS/BLUE SHIELD | Admitting: Physician Assistant

## 2017-04-01 VITALS — BP 128/72 | HR 78 | Temp 97.6°F | Resp 14 | Ht 60.25 in | Wt 176.4 lb

## 2017-04-01 DIAGNOSIS — K219 Gastro-esophageal reflux disease without esophagitis: Secondary | ICD-10-CM | POA: Diagnosis not present

## 2017-04-01 DIAGNOSIS — Z6834 Body mass index (BMI) 34.0-34.9, adult: Secondary | ICD-10-CM | POA: Diagnosis not present

## 2017-04-01 DIAGNOSIS — F3341 Major depressive disorder, recurrent, in partial remission: Secondary | ICD-10-CM | POA: Diagnosis not present

## 2017-04-01 MED ORDER — PHENTERMINE HCL 37.5 MG PO TABS: 37.5000 mg | ORAL_TABLET | Freq: Every day | ORAL | 0 refills | Status: DC
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
## 2017-04-01 NOTE — Patient Instructions (Addendum)
Are you an emotional eater? °Do you eat more when you’re feeling stressed? °Do you eat when you’re not hungry or when you’re full? °Do you eat to feel better (to calm and soothe yourself when you’re sad, mad, bored, anxious, etc.)? °Do you reward yourself with food? °Do you regularly eat until you’ve stuffed yourself? °Does food make you feel safe? Do you feel like food is a friend? °Do you feel powerless or out of control around food? ° °If you answered yes to some of these questions than it is likely that you are an emotional eater. This is normally a learned behavior and can take time to first recognize the signs and second BREAK THE HABIT. But here is more information and tips to help.  ° °The difference between emotional hunger and physical hunger °Emotional hunger can be powerful, so it’s easy to mistake it for physical hunger. But there are clues you can look for to help you tell physical and emotional hunger apart. ° °Emotional hunger comes on suddenly. It hits you in an instant and feels overwhelming and urgent. Physical hunger, on the other hand, comes on more gradually. The urge to eat doesn’t feel as dire or demand instant satisfaction (unless you haven’t eaten for a very long time). ° °Emotional hunger craves specific comfort foods. When you’re physically hungry, almost anything sounds good--including healthy stuff like vegetables. But emotional hunger craves junk food or sugary snacks that provide an instant rush. You feel like you need cheesecake or pizza, and nothing else will do. ° °Emotional hunger often leads to mindless eating. Before you know it, you’ve eaten a whole bag of chips or an entire pint of ice cream without really paying attention or fully enjoying it. When you’re eating in response to physical hunger, you’re typically more aware of what you’re doing. ° °Emotional hunger isn’t satisfied once you’re full. You keep wanting more and more, often eating until you’re uncomfortably stuffed.  Physical hunger, on the other hand, doesn’t need to be stuffed. You feel satisfied when your stomach is full. ° °Emotional hunger isn’t located in the stomach. Rather than a growling belly or a pang in your stomach, you feel your hunger as a craving you can’t get out of your head. You’re focused on specific textures, tastes, and smells. ° °Emotional hunger often leads to regret, guilt, or shame. When you eat to satisfy physical hunger, you’re unlikely to feel guilty or ashamed because you’re simply giving your body what it needs. If you feel guilty after you eat, it’s likely because you know deep down that you’re not eating for nutritional reasons. ° °Identify your emotional eating triggers °What situations, places, or feelings make you reach for the comfort of food? Most emotional eating is linked to unpleasant feelings, but it can also be triggered by positive emotions, such as rewarding yourself for achieving a goal or celebrating a holiday or happy event. Common causes of emotional eating include: ° °Stuffing emotions - Eating can be a way to temporarily silence or “stuff down” uncomfortable emotions, including anger, fear, sadness, anxiety, loneliness, resentment, and shame. While you’re numbing yourself with food, you can avoid the difficult emotions you’d rather not feel. ° °Boredom or feelings of emptiness - Do you ever eat simply to give yourself something to do, to relieve boredom, or as a way to fill a void in your life? You feel unfulfilled and empty, and food is a way to occupy your mouth and your time. In the moment,   it fills you up and distracts you from underlying feelings of purposelessness and dissatisfaction with your life. ° °Childhood habits - Think back to your childhood memories of food. Did your parents reward good behavior with ice cream, take you out for pizza when you got a good report card, or serve you sweets when you were feeling sad? These habits can often carry over into adulthood. Or  your eating may be driven by nostalgia--for cherished memories of grilling burgers in the backyard with your dad or baking and eating cookies with your mom. ° °Social influences - Getting together with other people for a meal is a great way to relieve stress, but it can also lead to overeating. It’s easy to overindulge simply because the food is there or because everyone else is eating. You may also overeat in social situations out of nervousness. Or perhaps your family or circle of friends encourages you to overeat, and it’s easier to go along with the group. ° °Stress - Ever notice how stress makes you hungry? It’s not just in your mind. When stress is chronic, as it so often is in our chaotic, fast-paced world, your body produces high levels of the stress hormone, cortisol. Cortisol triggers cravings for salty, sweet, and fried foods--foods that give you a burst of energy and pleasure. The more uncontrolled stress in your life, the more likely you are to turn to food for emotional relief. ° °Find other ways to feed your feelings °If you don’t know how to manage your emotions in a way that doesn’t involve food, you won’t be able to control your eating habits for very long. Diets so often fail because they offer logical nutritional advice which only works if you have conscious control over your eating habits. It doesn’t work when emotions hijack the process, demanding an immediate payoff with food. ° °In order to stop emotional eating, you have to find other ways to fulfill yourself emotionally. It’s not enough to understand the cycle of emotional eating or even to understand your triggers, although that’s a huge first step. You need alternatives to food that you can turn to for emotional fulfillment. ° °Alternatives to emotional eating °If you’re depressed or lonely, call someone who always makes you feel better, play with your dog or cat, or look at a favorite photo or cherished memento. ° °If you’re anxious,  expend your nervous energy by dancing to your favorite song, squeezing a stress ball, or taking a brisk walk. ° °If you’re exhausted, treat yourself with a hot cup of tea, take a bath, light some scented candles, or wrap yourself in a warm blanket. ° °If you’re bored, read a good book, watch a comedy show, explore the outdoors, or turn to an activity you enjoy (woodworking, playing the guitar, shooting hoops, scrapbooking, etc.). ° °What is mindful eating? °Mindful eating is a practice that develops your awareness of eating habits and allows you to pause between your triggers and your actions. Most emotional eaters feel powerless over their food cravings. When the urge to eat hits, you feel an almost unbearable tension that demands to be fed, right now. Because you’ve tried to resist in the past and failed, you believe that your willpower just isn’t up to snuff. But the truth is that you have more power over your cravings than you think. ° °Take 5 before you give in to a craving °Emotional eating tends to be automatic and virtually mindless. Before you even realize what you’re doing, you’ve   reached for a tub of ice cream and polished off half of it. But if you can take a moment to pause and reflect when you're hit with a craving, you give yourself the opportunity to make a different decision.  Can you put off eating for five minutes? Or just start with one minute. Don't tell yourself you can't give in to the craving; remember, the forbidden is extremely tempting. Just tell yourself to wait.  While you're waiting, check in with yourself. How are you feeling? What's going on emotionally? Even if you end up eating, you'll have a better understanding of why you did it. This can help you set yourself up for a different response next time.  How to practice mindful eating Eating while you're also doing other things-such as watching TV, driving, or playing with your phone-can prevent you from fully enjoying your  food. Since your mind is elsewhere, you may not feel satisfied or continue eating even though you're no longer hungry. Eating more mindfully can help focus your mind on your food and the pleasure of a meal and curb overeating.   Eat your meals in a calm place with no distractions, aside from any dining companions.  Try eating with your non-dominant hand or using chopsticks instead of a knife and fork. Eating in such a non-familiar way can slow down how fast you eat and ensure your mind stays focused on your food.  Allow yourself enough time not to have to rush your meal. Set a timer for 20 minutes and pace yourself so you spend at least that much time eating.  Take small bites and chew them well, taking time to notice the different flavors and textures of each mouthful.  Put your utensils down between bites. Take time to consider how you feel-hungry, satiated-before picking up your utensils again.  Try to stop eating before you are full.It takes time for the signal to reach your brain that you've had enough. Don't feel obligated to always clean your plate.  When you've finished your food, take a few moments to assess if you're really still hungry before opting for an extra serving or dessert.  Learn to accept your feelings-even the bad ones  While it may seem that the core problem is that you're powerless over food, emotional eating actually stems from feeling powerless over your emotions. You don't feel capable of dealing with your feelings head on, so you avoid them with food.  Recommended reading  Mini Habits for weight loss - get the book  Healthy Eating: A guide to the new nutrition - [place] Report  10 Tips for Mindful Eating - How mindfulness can help you fully enjoy a meal and the experience of eating-with moderation and restraint. ([place])  Weight Loss: Gain Control of Emotional Eating - Tips to regain control of your eating habits.  [facility])  Why Stress Causes People to Overeat -Tips on controlling stress eating. ([place])  Mindful Eating Meditations -Free online mindfulness meditations. ([facility] [employer])    Veggies are great because you can eat a ton! They are low in calories, great to fill you up, and have a ton of vitamins, minerals, and protein.      Phentermine  While taking the medication we may ask that you come into the office once a month for the first month for a blood pressure check and EKG.   Also please bring in a food log for  that visit to review. It is helpful if you bring in a food diary or use an app on your phone such as myfitnesspal to record your calorie intake, especially in the beginning. BRING FOR YOUR FIRST VISIT.  After that first initial visit, we will want to see you once every 2-3 months to monitor your weight, blood pressure, and heart rate.   In addition we can help answer your questions about diet, exercise, and help you every step of the way with your weight loss journey.  You can start out on 1/3 to 1/2 a pill in the morning and if you are tolerating it well you can increase to one pill daily. I also have some patients that take 1/3 or 1/2 at lunch to help prevent night time eating.  This medication is cheapest CASH pay at [facility] is 14-17 dollars and you do NOT need a membership to get meds from there.   It causes dry mouth and constipation in almost every patient, so try to get 80-100 oz of water a day and increase fiber such as veggies. You can add on a stool softener if you would like.   It can give you energy however it can also cause some people to be shaky, anxious or have palpitations. Stop this medication if that happens and contact the office.   If this medication does not work for you there are several medications that we can try to help rewire your brain in addition to making healthier habits.   What is  this medicine? PHENTERMINE (FEN ter meen) decreases your appetite. This medicine is intended to be used in addition to a healthy reduced calorie diet and exercise. The best results are achieved this way. This medicine is only indicated for short-term use. Eventually your weight loss may level out and the medication will no longer be needed.   How should I use this medicine? Take this medicine by mouth. Follow the directions on the prescription label. The tablets should stay in the bottle until immediately before you take your dose. Take your doses at regular intervals. Do not take your medicine more often than directed.  Overdosage: If you think you have taken too much of this medicine contact a poison control center or emergency room at once. NOTE: This medicine is only for you. Do not share this medicine with others.  What if I miss a dose? If you miss a dose, take it as soon as you can. If it is almost time for your next dose, take only that dose. Do not take double or extra doses. Do not increase or in any way change your dose without consulting your doctor.  What should I watch for while using this medicine? Notify your physician immediately if you become short of breath while doing your normal activities. Do not take this medicine within 6 hours of bedtime. It can keep you from getting to sleep. Avoid drinks that contain caffeine and try to stick to a regular bedtime every night. Do not stand or sit up quickly, especially if you are an older patient. This reduces the risk of dizzy or fainting spells. Avoid alcoholic drinks.  What side effects may I notice from receiving this medicine? Side effects that you should report to your doctor or health care professional as soon as possible: -chest pain, palpitations -depression or severe changes in mood -increased blood pressure -irritability -nervousness or restlessness -severe dizziness -shortness of breath -problems urinating -unusual  swelling of the legs -vomiting  Side effects that usually do not require medical attention (report to your doctor or health care professional if they continue or are bothersome): -blurred vision or other eye problems -changes in sexual ability or desire -constipation or diarrhea -difficulty sleeping -dry mouth or unpleasant taste -headache -nausea This list may not describe all possible side effects. Call your doctor for medical advice about side effects. You may report side effects to FDA at 1-800-FDA-1088.

## 2017-04-03 ENCOUNTER — Ambulatory Visit: Payer: Self-pay | Admitting: Physician Assistant

## 2017-04-24 ENCOUNTER — Other Ambulatory Visit: Payer: Self-pay | Admitting: Physician Assistant

## 2017-04-24 ENCOUNTER — Other Ambulatory Visit: Payer: Self-pay | Admitting: Internal Medicine

## 2017-04-24 ENCOUNTER — Other Ambulatory Visit (INDEPENDENT_AMBULATORY_CARE_PROVIDER_SITE_OTHER): Payer: Self-pay

## 2017-04-24 MED ORDER — DIAZEPAM 5 MG PO TABS: ORAL_TABLET | ORAL | 0 refills | Status: DC

## 2017-04-27 ENCOUNTER — Other Ambulatory Visit: Payer: Self-pay | Admitting: Physician Assistant

## 2017-04-27 DIAGNOSIS — K219 Gastro-esophageal reflux disease without esophagitis: Secondary | ICD-10-CM

## 2017-04-28 MED ORDER — HYDROCODONE-IBUPROFEN 7.5-200 MG PO TABS: 1.0000 | ORAL_TABLET | Freq: Three times a day (TID) | ORAL | 0 refills | Status: DC | PRN

## 2017-05-07 NOTE — Progress Notes (Deleted)
62 y.o.female presents for a follow up after being on phentermine for weight loss.  While on the medication they have lost {NUMBERS 0-12:18577} lbs since last visit. They deny palpitations, anxiety, trouble sleeping, elevated BP.   BP Readings from Last 3 Encounters:  04/01/17 128/72  03/02/17 130/80  01/09/17 124/86    An EKG was obtained at today's visit.   BMI is There is no height or weight on file to calculate BMI., she is working on diet and exercise. Wt Readings from Last 3 Encounters:  04/01/17 176 lb 6.4 oz (80 kg)  03/02/17 175 lb (79.4 kg)  01/09/17 172 lb 4 oz (78.1 kg)    Typical breakfast: Typical lunch:  Typical dinner:  Medications: Current Outpatient Medications on File Prior to Visit  Medication Sig Dispense Refill  . Cetirizine HCl (ZYRTEC ALLERGY PO) Take 1 tablet by mouth daily. Reported on 04/30/2015    . Cholecalciferol (VITAMIN D3) 5000 units CAPS Take 1 capsule by mouth daily.    . cyclobenzaprine (FLEXERIL) 5 MG tablet TAKE 1/2 TO 1 TABLET BY MOUTH EVERY NIGHT AT BEDTIME (Patient taking differently: Take 2.5-5 mg by mouth daily as needed for muscle spasms. ) 60 tablet 1  . dexlansoprazole (DEXILANT) 60 MG capsule Take 1 capsule (60 mg total) by mouth daily. 30 capsule 4  . diazepam (VALIUM) 5 MG tablet Take 1/2 to 1 tablet 2 to 2 x / day & please try to limit to 5 days /week to avoid addiction 60 tablet 0  . DULoxetine (CYMBALTA) 60 MG capsule Take 1-2 capsules daily - follow-up OV due March for 6 month 60 capsule 2  . EPINEPHrine (EPIPEN 2-PAK) 0.3 mg/0.3 mL DEVI Inject 0.3 mLs (0.3 mg total) into the muscle as needed (anaphylaxis). 1 Device 0  . gabapentin (NEURONTIN) 300 MG capsule Take 1 capsule (300 mg total) by mouth 3 (three) times daily. 90 capsule 3  . HYDROcodone-ibuprofen (VICOPROFEN) 7.5-200 MG tablet Take 1 tablet by mouth 3 (three) times daily as needed for moderate pain or severe pain. 30 tablet 0  . Loperamide HCl (IMODIUM PO) Take by mouth as  needed.    . Melatonin-Pyridoxine (MELATIN PO) Take by mouth.    . pantoprazole (PROTONIX) 40 MG tablet TAKE 1 TABLET(40 MG) BY MOUTH DAILY 90 tablet 1  . phentermine (ADIPEX-P) 37.5 MG tablet Take 1 tablet (37.5 mg total) by mouth daily before breakfast. 30 tablet 0  . triamcinolone (NASACORT AQ) 55 MCG/ACT AERO nasal inhaler Place 2 sprays into the nose daily. 1 Inhaler 12   No current facility-administered medications on file prior to visit.     ROS: All negative except for above  Physical exam: There were no vitals filed for this visit. Physical Exam  Assessment: Obesity with co morbid conditions.   Plan: General weight loss/lifestyle modification strategies discussed (elicit support from others; identify saboteurs; non-food rewards, etc). Continue food diary Continue restricted calorie diet Continue daily exercise as well as behavior modification such as walking further away, putting down the fork, having a plan, using stairs, etc.  Medication: phentermine  Follow up in {NUMBERS;1-7 BY 1:408017} months Future Appointments  Date Time Provider [place]  05/11/2017 10:30 AM Vicie Mutters, PA-C [facility] None  11/10/2017 10:00 AM Vicie Mutters, PA-C [facility] None    [id number] 15 mins 99402 30 mins 99403 45 mins 99404 60 mins

## 2017-05-11 ENCOUNTER — Ambulatory Visit: Payer: Self-pay | Admitting: Physician Assistant

## 2017-05-13 ENCOUNTER — Other Ambulatory Visit: Payer: Self-pay | Admitting: Physician Assistant

## 2017-05-27 ENCOUNTER — Other Ambulatory Visit: Payer: Self-pay | Admitting: Sports Medicine

## 2017-05-27 MED ORDER — HYDROCODONE-IBUPROFEN 7.5-200 MG PO TABS: 1.0000 | ORAL_TABLET | Freq: Three times a day (TID) | ORAL | 0 refills | Status: DC | PRN

## 2017-06-03 ENCOUNTER — Other Ambulatory Visit: Payer: Self-pay

## 2017-06-03 MED ORDER — GABAPENTIN 300 MG PO CAPS: 300.0000 mg | ORAL_CAPSULE | Freq: Three times a day (TID) | ORAL | 0 refills | Status: DC

## 2017-06-10 DIAGNOSIS — J029 Acute pharyngitis, unspecified: Secondary | ICD-10-CM | POA: Diagnosis not present

## 2017-06-11 ENCOUNTER — Encounter: Payer: Self-pay | Admitting: Sports Medicine

## 2017-06-11 ENCOUNTER — Ambulatory Visit: Payer: BLUE CROSS/BLUE SHIELD | Admitting: Sports Medicine

## 2017-06-11 VITALS — BP 130/88 | Ht 66.0 in | Wt 174.0 lb

## 2017-06-11 DIAGNOSIS — M48061 Spinal stenosis, lumbar region without neurogenic claudication: Secondary | ICD-10-CM

## 2017-06-11 DIAGNOSIS — S39012A Strain of muscle, fascia and tendon of lower back, initial encounter: Secondary | ICD-10-CM | POA: Diagnosis not present

## 2017-06-11 MED ORDER — GABAPENTIN 600 MG PO TABS: 600.0000 mg | ORAL_TABLET | Freq: Three times a day (TID) | ORAL | 1 refills | Status: DC

## 2017-06-11 NOTE — Patient Instructions (Signed)
1) please complete the exercises we discussed.  2) Gabapentin 600mg  three times a day 3) Follow up in 6 weeks

## 2017-06-11 NOTE — Progress Notes (Signed)
   [facility] Phone: [phone]   Date of Visit: 06/11/2017   HPI:  Low Back Pain:  - on March 22nd she lifted a heavy object at work and felt a strain in her tail bone which radiated down to her right leg  - then on April 12th, she did a few hours of heavy lifting at work. The next day had severe back pain - her back pain is getting better slowly. Ice, ibuprofen, and hydrocodone did not help. She restarted gabapentin 600mg  BID a few days ago which helped significantly. Heat also helps. She has been doing genti - she reports that she feels like she has foot drop bilaterally at times. Upon further questioning, she reports that sometimes she stumbles on her feet. This can occur maybe twice a day. She denies other lower extremity weakness, denies numbness/tingling. No urinary incontinence or retention, no bowel incontinence.  - her right SI joint locked up on her, she has muscles spasms of her back which is most significant with prolonged standing. She reports of right leg radiculopathy to the back of her knee.    ROS: See HPI.  [place]:  PMH: HTN Thoracic Disc herniation  Limited area of spinal stenosis in lumbar region MDD/Anxiety  Prediabetes   PHYSICAL EXAM: BP 130/88   Ht 5\' 6"  (1.676 m)   Wt 174 lb (78.9 kg)   LMP 03/09/2011   BMI 28.08 kg/m  Back:  Normal inspection No spinal tenderness to palpation. No paraspinal muscle tenderness to palpation. Reports of muscle spasms in the thoracic back during exam  Normal range of motion: forward flexion, lateral flexion, rotation and hyperextension without pain   Hip Bilateral: Standing hip rotation and gait without trendelenburg / unsteadiness. Greater trochanter without tenderness to palpation. No tenderness over piriformis and greater trochanter. No SI joint tenderness and normal minimal SI movement.  Lower Extremities:  Normal strength in bilateral lower extremities with hip flexion, abduction and  adduction against resistance Normal plantar flexion and dorsiflexion  Slight hyporeflexia of the patellar tendons (L > R) and achilles tendons Normal sensation to light touch bilaterally  Neurovascularly intact Normal toe walk and heel walk   ASSESSMENT/PLAN:  Lumbar strain, initial encounter Spinal stenosis of lumbar region without neurogenic claudication Symptoms most consistent with lumbar strain from repetitive lifting at work. She may have irritation of her known lumbar spinal stenosis. No weakness on her lower extremity exam. She does have Slight hyporeflexia of the patellar tendon (L > R) and achilles tendons bilaterally. Reviewed stretching (flexion) exercises for the low back. Start walking regularly. Gabapentin 600mg  TID since she is tolerating 600mg  BID dose currently. Follow up in 6 weeks   Smiley Houseman, MD PGY [place]  I observed and examined the patient with the resident and agree with assessment and plan.  Note reviewed and modified by me. Stefanie Libel, MD

## 2017-06-16 ENCOUNTER — Encounter

## 2017-06-16 ENCOUNTER — Ambulatory Visit: Payer: Self-pay | Admitting: Sports Medicine

## 2017-06-29 ENCOUNTER — Other Ambulatory Visit: Payer: Self-pay | Admitting: *Deleted

## 2017-06-29 MED ORDER — HYDROCODONE-IBUPROFEN 7.5-200 MG PO TABS: 1.0000 | ORAL_TABLET | Freq: Three times a day (TID) | ORAL | 0 refills | Status: DC | PRN

## 2017-07-21 ENCOUNTER — Ambulatory Visit: Payer: BLUE CROSS/BLUE SHIELD | Admitting: Sports Medicine

## 2017-08-04 ENCOUNTER — Other Ambulatory Visit: Payer: Self-pay | Admitting: Sports Medicine

## 2017-08-06 DIAGNOSIS — H524 Presbyopia: Secondary | ICD-10-CM | POA: Diagnosis not present

## 2017-08-06 DIAGNOSIS — H52221 Regular astigmatism, right eye: Secondary | ICD-10-CM | POA: Diagnosis not present

## 2017-08-06 DIAGNOSIS — H5213 Myopia, bilateral: Secondary | ICD-10-CM | POA: Diagnosis not present

## 2017-08-10 ENCOUNTER — Other Ambulatory Visit: Payer: Self-pay | Admitting: Sports Medicine

## 2017-08-11 ENCOUNTER — Other Ambulatory Visit: Payer: Self-pay | Admitting: *Deleted

## 2017-08-11 MED ORDER — HYDROCODONE-IBUPROFEN 7.5-200 MG PO TABS: 1.0000 | ORAL_TABLET | Freq: Three times a day (TID) | ORAL | 0 refills | Status: DC | PRN

## 2017-08-16 ENCOUNTER — Other Ambulatory Visit: Payer: Self-pay | Admitting: Sports Medicine

## 2017-09-18 ENCOUNTER — Other Ambulatory Visit: Payer: Self-pay

## 2017-09-18 MED ORDER — HYDROCODONE-IBUPROFEN 7.5-200 MG PO TABS: 1.0000 | ORAL_TABLET | Freq: Three times a day (TID) | ORAL | 0 refills | Status: DC | PRN

## 2017-10-02 ENCOUNTER — Other Ambulatory Visit: Payer: Self-pay

## 2017-10-02 MED ORDER — DULOXETINE HCL 60 MG PO CPEP: ORAL_CAPSULE | ORAL | 0 refills | Status: DC

## 2017-10-02 MED ORDER — DIAZEPAM 5 MG PO TABS: ORAL_TABLET | ORAL | 0 refills | Status: DC

## 2017-10-22 ENCOUNTER — Other Ambulatory Visit: Payer: Self-pay

## 2017-10-22 ENCOUNTER — Other Ambulatory Visit: Payer: Self-pay | Admitting: *Deleted

## 2017-10-22 DIAGNOSIS — K219 Gastro-esophageal reflux disease without esophagitis: Secondary | ICD-10-CM

## 2017-10-22 MED ORDER — HYDROCODONE-IBUPROFEN 7.5-200 MG PO TABS: 1.0000 | ORAL_TABLET | Freq: Three times a day (TID) | ORAL | 0 refills | Status: DC | PRN

## 2017-10-22 MED ORDER — PANTOPRAZOLE SODIUM 40 MG PO TBEC: DELAYED_RELEASE_TABLET | ORAL | 1 refills | Status: DC
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
## 2017-11-10 ENCOUNTER — Ambulatory Visit: Payer: BLUE CROSS/BLUE SHIELD | Admitting: Physician Assistant

## 2017-11-10 ENCOUNTER — Encounter: Payer: Self-pay | Admitting: Physician Assistant

## 2017-11-10 VITALS — BP 132/90 | HR 96 | Temp 97.6°F | Ht 66.0 in | Wt 172.6 lb

## 2017-11-10 DIAGNOSIS — E559 Vitamin D deficiency, unspecified: Secondary | ICD-10-CM

## 2017-11-10 DIAGNOSIS — T7840XD Allergy, unspecified, subsequent encounter: Secondary | ICD-10-CM

## 2017-11-10 DIAGNOSIS — K219 Gastro-esophageal reflux disease without esophagitis: Secondary | ICD-10-CM

## 2017-11-10 DIAGNOSIS — Z1329 Encounter for screening for other suspected endocrine disorder: Secondary | ICD-10-CM | POA: Diagnosis not present

## 2017-11-10 DIAGNOSIS — Z130 Encounter for screening for diseases of the blood and blood-forming organs and certain disorders involving the immune mechanism: Secondary | ICD-10-CM | POA: Diagnosis not present

## 2017-11-10 DIAGNOSIS — R7303 Prediabetes: Secondary | ICD-10-CM

## 2017-11-10 DIAGNOSIS — Z79899 Other long term (current) drug therapy: Secondary | ICD-10-CM | POA: Diagnosis not present

## 2017-11-10 DIAGNOSIS — Z0000 Encounter for general adult medical examination without abnormal findings: Secondary | ICD-10-CM | POA: Diagnosis not present

## 2017-11-10 DIAGNOSIS — I10 Essential (primary) hypertension: Secondary | ICD-10-CM | POA: Diagnosis not present

## 2017-11-10 DIAGNOSIS — Z136 Encounter for screening for cardiovascular disorders: Secondary | ICD-10-CM | POA: Diagnosis not present

## 2017-11-10 DIAGNOSIS — Z131 Encounter for screening for diabetes mellitus: Secondary | ICD-10-CM | POA: Diagnosis not present

## 2017-11-10 DIAGNOSIS — Z0001 Encounter for general adult medical examination with abnormal findings: Secondary | ICD-10-CM

## 2017-11-10 DIAGNOSIS — F419 Anxiety disorder, unspecified: Secondary | ICD-10-CM

## 2017-11-10 DIAGNOSIS — Z13220 Encounter for screening for lipoid disorders: Secondary | ICD-10-CM

## 2017-11-10 DIAGNOSIS — N83202 Unspecified ovarian cyst, left side: Secondary | ICD-10-CM

## 2017-11-10 DIAGNOSIS — G4700 Insomnia, unspecified: Secondary | ICD-10-CM

## 2017-11-10 DIAGNOSIS — Z1389 Encounter for screening for other disorder: Secondary | ICD-10-CM

## 2017-11-10 DIAGNOSIS — F3341 Major depressive disorder, recurrent, in partial remission: Secondary | ICD-10-CM

## 2017-11-10 DIAGNOSIS — R5383 Other fatigue: Secondary | ICD-10-CM

## 2017-11-10 DIAGNOSIS — M5124 Other intervertebral disc displacement, thoracic region: Secondary | ICD-10-CM

## 2017-11-10 DIAGNOSIS — E782 Mixed hyperlipidemia: Secondary | ICD-10-CM

## 2017-11-10 DIAGNOSIS — R1011 Right upper quadrant pain: Secondary | ICD-10-CM

## 2017-11-10 MED ORDER — DULOXETINE HCL 30 MG PO CPEP: 30.0000 mg | ORAL_CAPSULE | Freq: Every day | ORAL | 2 refills | Status: DC

## 2017-11-10 MED ORDER — LIDOCAINE HCL URETHRAL/MUCOSAL 2 % EX GEL: CUTANEOUS | 1 refills | Status: DC

## 2017-11-10 NOTE — Patient Instructions (Addendum)
Call GI and get EGD for the reflux, need to rule out H pylori.  Dr. Henrene Pastor Phone: [phone]   HOW TO [place]  7 a.m.-6:30 p.m., Monday 7 a.m.-5 p.m., Tuesday-Friday Schedule an appointment by calling [phone].     When it comes to diets, agreement about the perfect plan isn't easy to find, even among the experts. Experts at the [employer] developed an idea known as the Healthy Eating Plate. Just imagine a plate divided into logical, healthy portions.  The emphasis is on diet quality:  Load up on vegetables and fruits - one-half of your plate: Aim for color and variety, and remember that potatoes don't count.  Go for whole grains - one-quarter of your plate: Whole wheat, barley, wheat berries, quinoa, oats, brown rice, and foods made with them. If you want pasta, go with whole wheat pasta.  Protein power - one-quarter of your plate: Fish, chicken, beans, and nuts are all healthy, versatile protein sources. Limit red meat.  The diet, however, does go beyond the plate, offering a few other suggestions.  Use healthy plant oils, such as olive, canola, soy, corn, sunflower and peanut. Check the labels, and avoid partially hydrogenated oil, which have unhealthy trans fats.  If you're thirsty, drink water. Coffee and tea are good in moderation, but skip sugary drinks and limit milk and dairy products to one or two daily servings.  The type of carbohydrate in the diet is more important than the amount. Some sources of carbohydrates, such as vegetables, fruits, whole grains, and beans-are healthier than others.  Finally, stay active.

## 2017-11-11 LAB — CBC WITH DIFFERENTIAL/PLATELET
BASOS PCT: 0.9 %
Basophils Absolute: 68 cells/uL (ref 0–200)
EOS ABS: 638 {cells}/uL — AB (ref 15–500)
Eosinophils Relative: 8.5 %
HCT: 37.4 % (ref 35.0–45.0)
HEMOGLOBIN: 12.7 g/dL (ref 11.7–15.5)
Lymphs Abs: 1223 cells/uL (ref 850–3900)
MCH: 30.8 pg (ref 27.0–33.0)
MCHC: 34 g/dL (ref 32.0–36.0)
MCV: 90.6 fL (ref 80.0–100.0)
MPV: 9.7 fL (ref 7.5–12.5)
Monocytes Relative: 10.9 %
NEUTROS ABS: 4755 {cells}/uL (ref 1500–7800)
Neutrophils Relative %: 63.4 %
PLATELETS: 285 10*3/uL (ref 140–400)
RBC: 4.13 10*6/uL (ref 3.80–5.10)
RDW: 11.8 % (ref 11.0–15.0)
TOTAL LYMPHOCYTE: 16.3 %
WBC mixed population: 818 cells/uL (ref 200–950)
WBC: 7.5 10*3/uL (ref 3.8–10.8)

## 2017-11-11 LAB — MAGNESIUM: Magnesium: 2.2 mg/dL (ref 1.5–2.5)

## 2017-11-11 LAB — LIPID PANEL
CHOL/HDL RATIO: 3.4 (calc) (ref <5.0)
Cholesterol: 223 mg/dL — ABNORMAL HIGH (ref <200)
HDL: 66 mg/dL (ref >50)
LDL Cholesterol (Calc): 140 mg/dL (calc) — ABNORMAL HIGH
NON-HDL CHOLESTEROL (CALC): 157 mg/dL — AB (ref <130)
TRIGLYCERIDES: 76 mg/dL (ref <150)

## 2017-11-11 LAB — URINALYSIS, ROUTINE W REFLEX MICROSCOPIC
Bilirubin Urine: NEGATIVE
GLUCOSE, UA: NEGATIVE
Hgb urine dipstick: NEGATIVE
Ketones, ur: NEGATIVE
LEUKOCYTES UA: NEGATIVE
NITRITE: NEGATIVE
Protein, ur: NEGATIVE
SPECIFIC GRAVITY, URINE: 1.016 (ref 1.001–1.03)
pH: 7.5 (ref 5.0–8.0)

## 2017-11-11 LAB — MICROALBUMIN / CREATININE URINE RATIO
Creatinine, Urine: 83 mg/dL (ref 20–275)
Microalb Creat Ratio: 10 mcg/mg creat (ref <30)
Microalb, Ur: 0.8 mg/dL

## 2017-11-11 LAB — COMPLETE METABOLIC PANEL WITH GFR
AG Ratio: 2.1 (calc) (ref 1.0–2.5)
ALT: 25 U/L (ref 6–29)
AST: 24 U/L (ref 10–35)
Albumin: 4.4 g/dL (ref 3.6–5.1)
Alkaline phosphatase (APISO): 59 U/L (ref 33–130)
BUN: 12 mg/dL (ref 7–25)
CALCIUM: 10.4 mg/dL (ref 8.6–10.4)
CO2: 26 mmol/L (ref 20–32)
CREATININE: 0.68 mg/dL (ref 0.50–0.99)
Chloride: 107 mmol/L (ref 98–110)
GFR, EST AFRICAN AMERICAN: 110 mL/min/{1.73_m2} (ref >=60)
GFR, EST NON AFRICAN AMERICAN: 95 mL/min/{1.73_m2} (ref >=60)
GLOBULIN: 2.1 g/dL (ref 1.9–3.7)
Glucose, Bld: 98 mg/dL (ref 65–99)
Potassium: 4.2 mmol/L (ref 3.5–5.3)
Sodium: 140 mmol/L (ref 135–146)
TOTAL PROTEIN: 6.5 g/dL (ref 6.1–8.1)
Total Bilirubin: 0.4 mg/dL (ref 0.2–1.2)

## 2017-11-11 LAB — IRON, TOTAL/TOTAL IRON BINDING CAP
%SAT: 10 % (calc) — ABNORMAL LOW (ref 16–45)
IRON: 31 ug/dL — AB (ref 45–160)
TIBC: 318 mcg/dL (calc) (ref 250–450)

## 2017-11-11 LAB — VITAMIN B12: VITAMIN B 12: 406 pg/mL (ref 200–1100)

## 2017-11-11 LAB — TSH: TSH: 0.94 m[IU]/L (ref 0.40–4.50)

## 2017-11-11 LAB — HEMOGLOBIN A1C
HEMOGLOBIN A1C: 5.4 %{Hb} (ref <5.7)
Mean Plasma Glucose: 108 (calc)
eAG (mmol/L): 6 (calc)

## 2017-11-11 LAB — VITAMIN D 25 HYDROXY (VIT D DEFICIENCY, FRACTURES): Vit D, 25-Hydroxy: 49 ng/mL (ref 30–100)

## 2017-11-12 ENCOUNTER — Other Ambulatory Visit: Payer: Self-pay | Admitting: Physician Assistant

## 2017-11-12 MED ORDER — PREDNISONE 20 MG PO TABS: ORAL_TABLET | ORAL | 0 refills | Status: DC

## 2017-11-23 ENCOUNTER — Other Ambulatory Visit: Payer: Self-pay

## 2017-11-25 ENCOUNTER — Other Ambulatory Visit: Payer: Self-pay

## 2017-11-26 ENCOUNTER — Other Ambulatory Visit: Payer: Self-pay | Admitting: Sports Medicine

## 2017-11-26 MED ORDER — HYDROCODONE-IBUPROFEN 7.5-200 MG PO TABS: 1.0000 | ORAL_TABLET | Freq: Three times a day (TID) | ORAL | 0 refills | Status: DC | PRN

## 2017-12-09 ENCOUNTER — Encounter: Payer: Self-pay | Admitting: Internal Medicine

## 2017-12-10 ENCOUNTER — Encounter: Payer: Self-pay | Admitting: Internal Medicine

## 2017-12-21 ENCOUNTER — Other Ambulatory Visit: Payer: Self-pay

## 2017-12-25 ENCOUNTER — Other Ambulatory Visit: Payer: Self-pay | Admitting: *Deleted

## 2017-12-25 ENCOUNTER — Other Ambulatory Visit: Payer: Self-pay | Admitting: Sports Medicine

## 2017-12-25 MED ORDER — HYDROCODONE-IBUPROFEN 7.5-200 MG PO TABS: 1.0000 | ORAL_TABLET | Freq: Three times a day (TID) | ORAL | 0 refills | Status: DC | PRN

## 2018-01-06 ENCOUNTER — Other Ambulatory Visit: Payer: Self-pay | Admitting: *Deleted

## 2018-01-06 DIAGNOSIS — M5412 Radiculopathy, cervical region: Secondary | ICD-10-CM

## 2018-01-06 DIAGNOSIS — Z0001 Encounter for general adult medical examination with abnormal findings: Secondary | ICD-10-CM

## 2018-01-25 ENCOUNTER — Other Ambulatory Visit: Payer: Self-pay

## 2018-01-26 ENCOUNTER — Other Ambulatory Visit: Payer: Self-pay | Admitting: Sports Medicine

## 2018-01-26 MED ORDER — HYDROCODONE-IBUPROFEN 7.5-200 MG PO TABS: 1.0000 | ORAL_TABLET | Freq: Three times a day (TID) | ORAL | 0 refills | Status: DC | PRN

## 2018-03-01 ENCOUNTER — Other Ambulatory Visit: Payer: Self-pay | Admitting: *Deleted

## 2018-03-01 MED ORDER — HYDROCODONE-IBUPROFEN 7.5-200 MG PO TABS: 1.0000 | ORAL_TABLET | Freq: Three times a day (TID) | ORAL | 0 refills | Status: DC | PRN

## 2018-03-04 MED ORDER — ACYCLOVIR 5 % EX OINT: TOPICAL_OINTMENT | CUTANEOUS | 2 refills | Status: AC

## 2018-03-30 ENCOUNTER — Other Ambulatory Visit: Payer: Self-pay

## 2018-04-01 ENCOUNTER — Other Ambulatory Visit: Payer: Self-pay | Admitting: Sports Medicine

## 2018-04-01 MED ORDER — HYDROCODONE-IBUPROFEN 7.5-200 MG PO TABS: 1.0000 | ORAL_TABLET | Freq: Three times a day (TID) | ORAL | 0 refills | Status: DC | PRN

## 2018-04-16 ENCOUNTER — Other Ambulatory Visit: Payer: Self-pay | Admitting: Physician Assistant

## 2018-04-16 DIAGNOSIS — K219 Gastro-esophageal reflux disease without esophagitis: Secondary | ICD-10-CM

## 2018-04-26 ENCOUNTER — Other Ambulatory Visit: Payer: Self-pay

## 2018-04-29 ENCOUNTER — Other Ambulatory Visit: Payer: Self-pay | Admitting: Sports Medicine

## 2018-04-29 MED ORDER — HYDROCODONE-IBUPROFEN 7.5-200 MG PO TABS: 1.0000 | ORAL_TABLET | Freq: Three times a day (TID) | ORAL | 0 refills | Status: DC | PRN

## 2018-05-05 ENCOUNTER — Other Ambulatory Visit: Payer: Self-pay

## 2018-05-05 ENCOUNTER — Ambulatory Visit: Payer: Self-pay

## 2018-05-05 ENCOUNTER — Ambulatory Visit: Payer: BLUE CROSS/BLUE SHIELD | Admitting: Sports Medicine

## 2018-05-05 VITALS — BP 155/92 | Ht 66.0 in | Wt 183.0 lb

## 2018-05-05 DIAGNOSIS — M25512 Pain in left shoulder: Secondary | ICD-10-CM | POA: Diagnosis not present

## 2018-05-05 NOTE — Progress Notes (Signed)
HPI  CC: Left shoulder pain  Gabriela [name] is a 63-year-old female presents for left shoulder pain.  She states the pain started around 1 month ago.  She works at [employer], and noted pain when she is going overhead.  She states she remembers 1 day when she was reaching overhead with the pain pacifically started.  She not remember any pop at that time.  Acutely, she states that she was reaching for can last night in front of her, when she felt the pop in her shoulder.  She noted some anterior shoulder pain after that time.  She had some pain with supination following that.  She is also continued to have worsening pain with overhead activity.  The pain does bother her at night.  The pain does not radiate to the parts of her body.  She denies any numbness and tingling in her hand.  She denies any acute weakness.  She has not had any injuries to the shoulder in the past.  She has been taking hydrocodone and ibuprofen for pain relief.  This is helped somewhat.  See HPI and/or previous note for associated ROS.  Objective: BP (!) 155/92   Ht 5\' 6"  (1.676 m)   Wt 183 lb (83 kg)   LMP 03/09/2011   BMI 29.54 kg/m  Gen: Right-Hand Dominant. NAD, well groomed, a/o x3, normal affect.  CV: Well-perfused. Warm.  Resp: Non-labored.  Neuro: Sensation intact throughout. No gross coordination deficits.  Gait: Nonpathologic posture, unremarkable stride without signs of limp or balance issues.  Left shoulder exam: No erythema, warmth, swelling noted.  Tenderness palpation of the bicipital groove.  Full range of motion forward flexion, abduction, internal rotation, and external rotation.  Strength 4+ out of 5 in empty can.  Strength out of 5 throughout the rest of testing.  Negative Hawkins test.  Positive speeds test, positive Yergason's test.  ULTRASOUND: Shoulder, left Diagnostic complete ultrasound imaging obtained of patient's left shoulder.  -Osteophyte development noted along the humeral head  throughout exam. - Long head of the biceps tendon: Significant fluid around tendon noted, with possible partial tear noted in short axis.  Fluid also visualized in long axis view.  Significant edema with positive bull's-eye sign.  - Subscapularis tendon: complete visualization across the width of the insertion point yielded no evidence of tendon thickening, calcification, or tears in the long axis view.  - Supraspinatus tendon: complete visualization across the width of the insertion point yielded no evidence of tendon thickening, or tears in the long axis view.  Calcification noted all along the footplate of the supraspinatus.  Atrophy noted.  Bursal inflammation appreciated.  - Infraspinatus and teres minor tendons: visualization across the width of the insertion points yielded no evidence of tendon thickening, calcification, or tears in the long axis view.  [facility] Joint: No evidence of joint separation, collapse, or osteophyte development appreciated.  Some effusion noted with positive geyser sign IMPRESSION: findings consistent with biceps tendinopathy, with possible partial tear.  Calcific tendinopathy of the supraspinatus.  Assessment and plan: Left shoulder pain, likely secondary to biceps tendon partial tear versus rotator cuff pathology with calcific tendinopathy of supraspinatus  We discussed treatment options at today's visit.  At this time, we advised her to rest and ice the area for 1 week.  She can take anti-inflammatories as needed.  She takes hydrocodone daily for back pain already.  She can continue this as needed.  We will follow her up with her in  1 week.  We did discuss that this is not improved, the next that would be to MRI the area and send her for surgical evaluation.  We could also consider doing a steroid injection into the bicipital tendon.  The biceps tendon is a major pain generator, and rupture of the long head at its proximal insertion could relieve the pain.  Gabriela Rife,  MD [place] [place] [facility] Fellow 05/05/2018 11:27 AM  Patient seen and evaluated with the sports medicine fellow.  I agree with the above plan of care.  Patient appears to have a partial proximal biceps tendon tear.  Also ultrasound evidence of calcific tendinopathy of the supraspinatus.  Treatment as above with a follow-up visit in 1 week.  She will continue working for now but I did discuss the possibility of removing her from work if her pain is severe.  She has a sling to wear at work only.  I want her doing pendulum exercises out of the sling daily.  Call with questions or concerns prior to follow-up visit.

## 2018-05-06 ENCOUNTER — Encounter: Payer: Self-pay | Admitting: Sports Medicine

## 2018-05-12 ENCOUNTER — Encounter: Payer: Self-pay | Admitting: Sports Medicine

## 2018-05-12 ENCOUNTER — Other Ambulatory Visit: Payer: Self-pay

## 2018-05-12 ENCOUNTER — Ambulatory Visit (INDEPENDENT_AMBULATORY_CARE_PROVIDER_SITE_OTHER): Payer: BLUE CROSS/BLUE SHIELD | Admitting: Sports Medicine

## 2018-05-12 ENCOUNTER — Telehealth: Payer: Self-pay | Admitting: Sports Medicine

## 2018-05-12 VITALS — Ht 66.0 in | Wt 185.0 lb

## 2018-05-12 DIAGNOSIS — M25512 Pain in left shoulder: Secondary | ICD-10-CM

## 2018-05-12 MED ORDER — DIAZEPAM 5 MG PO TABS: ORAL_TABLET | ORAL | 0 refills | Status: DC

## 2018-05-12 NOTE — Progress Notes (Signed)
  Virtual Visit via Telephone Note  I connected with Gabriela [name] on 05/12/18 at  9:30 AM EDT by telephone and verified that I am speaking with the correct person using two identifiers.   I discussed the limitations, risks, security and privacy concerns of performing an evaluation and management service by telephone and the availability of in person appointments. I also discussed with the patient that there may be a patient responsible charge related to this service. The patient expressed understanding and agreed to proceed.   History of Present Illness: I spoke with the patient on the phone today about her shoulder injury.  She is improving but still having some pain.  She has not been wearing her sling.  She has been taking intermittent doses of cyclobenzaprine with some benefit.  She is also endorsing shoulder pain at night while sleeping.    Observations/Objective: Unable to evaluate via telephone   Assessment and Plan: Left shoulder pain secondary to proximal biceps tendinopathy/partial tearing and calcific rotator cuff tendinopathy  Since the patient is improving, we are going to take a watchful waiting approach.  However, if her improvement plateaus or her symptoms worsen then we will need to consider an MRI possibly for presurgical planning.  She is encouraged to call me if that is the case.  Otherwise, follow-up as needed.   Follow Up Instructions: As above    I discussed the assessment and treatment plan with the patient. The patient was provided an opportunity to ask questions and all were answered. The patient agreed with the plan and demonstrated an understanding of the instructions.   The patient was advised to call back or seek an in-person evaluation if the symptoms worsen or if the condition fails to improve as anticipated.  I provided 10 minutes of non-face-to-face time during this encounter.   Shellia Cleverly, DO

## 2018-05-17 ENCOUNTER — Ambulatory Visit: Payer: Self-pay | Admitting: Physician Assistant

## 2018-05-17 DIAGNOSIS — E782 Mixed hyperlipidemia: Secondary | ICD-10-CM | POA: Insufficient documentation
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
## 2018-05-18 ENCOUNTER — Other Ambulatory Visit: Payer: Self-pay | Admitting: Physician Assistant

## 2018-05-18 ENCOUNTER — Other Ambulatory Visit: Payer: Self-pay

## 2018-05-18 ENCOUNTER — Ambulatory Visit: Payer: BLUE CROSS/BLUE SHIELD | Admitting: Physician Assistant

## 2018-05-18 ENCOUNTER — Encounter: Payer: Self-pay | Admitting: Physician Assistant

## 2018-05-18 VITALS — Temp 97.9°F | Ht 66.0 in | Wt 178.0 lb

## 2018-05-18 DIAGNOSIS — E782 Mixed hyperlipidemia: Secondary | ICD-10-CM | POA: Diagnosis not present

## 2018-05-18 DIAGNOSIS — K219 Gastro-esophageal reflux disease without esophagitis: Secondary | ICD-10-CM | POA: Diagnosis not present

## 2018-05-18 DIAGNOSIS — I10 Essential (primary) hypertension: Secondary | ICD-10-CM | POA: Diagnosis not present

## 2018-05-18 DIAGNOSIS — F3341 Major depressive disorder, recurrent, in partial remission: Secondary | ICD-10-CM | POA: Diagnosis not present

## 2018-05-18 MED ORDER — ALBUTEROL SULFATE HFA 108 (90 BASE) MCG/ACT IN AERS: 2.0000 | INHALATION_SPRAY | RESPIRATORY_TRACT | 0 refills | Status: DC | PRN

## 2018-05-18 MED ORDER — UMECLIDINIUM-VILANTEROL 62.5-25 MCG/INH IN AEPB: 1.0000 | INHALATION_SPRAY | Freq: Every day | RESPIRATORY_TRACT | 1 refills | Status: DC

## 2018-05-18 MED ORDER — BISOPROLOL FUMARATE 5 MG PO TABS: ORAL_TABLET | ORAL | 2 refills | Status: DC

## 2018-05-18 NOTE — Patient Instructions (Addendum)
Do the inhaler once a day Get the coupon for anoro online should be 10 dollars a month with coupon.  If this does not help can still be silent reflux, increase the protonix to 2 x a day I want a chest xray eventually   INFORMATION ABOUT YOUR NHALER  Can do inhaler, NEED TO DO DAILY, this is NOT a rescue inhaler so if you are acutely short of breath please use your albuterol or call 911.  Do 1 puff ONCE a day.   Go to the ER if any chest pain, shortness of breath, nausea, dizziness, severe HA, changes vision/speech  Effusion on your ear Your ears and sinuses are connected by the eustachian tube. When your sinuses are inflamed, this can close off the tube and cause fluid to collect in your middle ear. This can then cause dizziness, popping, clicking, ringing, and echoing in your ears. This is often NOT an infection and does NOT require antibiotics, it is caused by inflammation so the treatments help the inflammation. This can take a long time to get better so please be patient.  Here are things you can do to help with this: - Try the Flonase or Nasonex. Remember to spray each nostril twice towards the outer part of your eye.  Do not sniff but instead pinch your nose and tilt your head back to help the medicine get into your sinuses.  The best time to do this is at bedtime.Stop if you get blurred vision or nose bleeds.  -While drinking fluids, pinch and hold nose close and swallow, to help open eustachian tubes to drain fluid behind ear drums. -Please pick one of the over the counter allergy medications below and take it once daily for allergies.  It will also help with fluid behind ear drums. Claritin or loratadine cheapest but likely the weakest  Zyrtec or certizine at night because it can make you sleepy The strongest is allegra or fexafinadine  Cheapest at [employer], [employer], [employer] -can use decongestant over the counter, please do not use if you have high blood pressure or certain heart  conditions.   if worsening HA, changes vision/speech, imbalance, weakness go to the ER   Silent reflux: Not all heartburn burns...Marland KitchenMarland KitchenMarland Kitchen  What is LPR? Laryngopharyngeal reflux (LPR) or silent reflux is a condition in which acid that is made in the stomach travels up the esophagus (swallowing tube) and gets to the throat. Not everyone with reflux has a lot of heartburn or indigestion. In fact, many people with LPR never have heartburn. This is why LPR is called SILENT REFLUX, and the terms "Silent reflux" and "LPR" are often used interchangeably. Because LPR is silent, it is sometimes difficult to diagnose.  How can you tell if you have LPR?  Marland Kitchen Chronic hoarseness- Some people have hoarseness that comes and goes . throat clearing  . Cough . It can cause shortness of breath and cause asthma like symptoms. Marland Kitchen a feeling of a lump in the throat  . difficulty swallowing . a problem with too much nose and throat drainage.  . Some people will feel their esophagus spasm which feels like their heart beating hard and fast, this will usually be after a meal, at rest, or lying down at night.    How do I treat this? Treatment for LPR should be individualized, and your doctor will suggest the best treatment for you. Generally there are several treatments for LPR: . changing habits and diet to reduce reflux,  .  medications to reduce stomach acid, and  . surgery to prevent reflux. Most people with LPR need to modify how and when they eat, as well as take some medication, to get well. Sometimes, nonprescription liquid antacids, such as Maalox, Gelucil and Mylanta are recommended. When used, these antacids should be taken four times each day - one tablespoon one hour after each meal and before bedtime. Dietary and lifestyle changes alone are not often enough to control LPR - medications that reduce stomach acid are also usually needed. These must be prescribed by our doctor.   TIPS FOR REDUCING REFLUX AND  LPR Control your LIFE-STYLE and your DIET! Marland Kitchen If you use tobacco, QUIT.  Marland Kitchen Smoking makes you reflux. After every cigarette you have some LPR.  . Don't wear clothing that is too tight, especially around the waist (trousers, corsets, belts).  . Do not lie down just after eating...in fact, do not eat within three hours of bedtime.  . You should be on a low-fat diet.  . Limit your intake of red meat.  . Limit your intake of butter.  Marland Kitchen Avoid fried foods.  . Avoid chocolate  . Avoid cheese.  Marland Kitchen Avoid eggs. Marland Kitchen Specifically avoid caffeine (especially coffee and tea), soda pop (especially cola) and mints.  . Avoid alcoholic beverages, particularly in the evening.

## 2018-05-27 ENCOUNTER — Other Ambulatory Visit: Payer: Self-pay

## 2018-06-01 ENCOUNTER — Other Ambulatory Visit: Payer: Self-pay | Admitting: Sports Medicine

## 2018-06-01 MED ORDER — HYDROCODONE-IBUPROFEN 7.5-200 MG PO TABS: 1.0000 | ORAL_TABLET | Freq: Three times a day (TID) | ORAL | 0 refills | Status: DC | PRN

## 2018-06-24 ENCOUNTER — Ambulatory Visit: Payer: BLUE CROSS/BLUE SHIELD | Admitting: Physician Assistant
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
## 2018-06-28 ENCOUNTER — Other Ambulatory Visit: Payer: Self-pay

## 2018-06-29 ENCOUNTER — Encounter: Payer: Self-pay | Admitting: Physician Assistant

## 2018-06-29 ENCOUNTER — Ambulatory Visit: Payer: BLUE CROSS/BLUE SHIELD | Admitting: Physician Assistant

## 2018-06-29 ENCOUNTER — Other Ambulatory Visit: Payer: Self-pay

## 2018-06-29 VITALS — BP 146/90 | HR 83 | Temp 98.6°F | Wt 184.0 lb

## 2018-06-29 DIAGNOSIS — Z6834 Body mass index (BMI) 34.0-34.9, adult: Secondary | ICD-10-CM | POA: Diagnosis not present

## 2018-06-29 DIAGNOSIS — K219 Gastro-esophageal reflux disease without esophagitis: Secondary | ICD-10-CM

## 2018-06-29 MED ORDER — PANTOPRAZOLE SODIUM 40 MG PO TBEC: 40.0000 mg | DELAYED_RELEASE_TABLET | Freq: Two times a day (BID) | ORAL | 1 refills | Status: DC

## 2018-07-01 ENCOUNTER — Other Ambulatory Visit: Payer: Self-pay | Admitting: Sports Medicine

## 2018-07-01 MED ORDER — HYDROCODONE-IBUPROFEN 7.5-200 MG PO TABS: 1.0000 | ORAL_TABLET | Freq: Three times a day (TID) | ORAL | 0 refills | Status: DC | PRN

## 2018-07-29 ENCOUNTER — Ambulatory Visit: Payer: BC Managed Care – PPO | Admitting: Sports Medicine

## 2018-07-29 ENCOUNTER — Other Ambulatory Visit: Payer: Self-pay

## 2018-07-29 VITALS — BP 140/70 | Ht 66.0 in | Wt 184.0 lb

## 2018-07-29 DIAGNOSIS — S8002XA Contusion of left knee, initial encounter: Secondary | ICD-10-CM

## 2018-07-29 MED ORDER — HYDROCODONE-IBUPROFEN 7.5-200 MG PO TABS: 1.0000 | ORAL_TABLET | Freq: Three times a day (TID) | ORAL | 0 refills | Status: DC | PRN

## 2018-07-29 NOTE — Assessment & Plan Note (Signed)
Knee exam is reassuring, and patient was told that she likely has a contusion of her left knee that should improve with time.  She was given quadricep strengthening exercises in order to lift her patella from the intertrochlear groove of the femur and prevent future knee injury.  She was encouraged to stay active at least 30 minutes 3 times a week with the goal of increasing to 30 minutes 5 times weekly to help alleviate her back and joint pains.  She will follow-up with [place] as needed.

## 2018-07-29 NOTE — Progress Notes (Signed)
Subjective:    Gabriela [name] - 63 y.o. female MRN [id number]  Date of birth: Jun 26, 1955  CC:  Gabriela [name] is here for left knee pain following a fall.  HPI: Gabriela [name] reports that this morning, she slipped on her right foot and fell onto her left patella, hitting concrete.  She did not hear a pop when this occurred and did not note immediate swelling.  Since then, she has had some pain in that knee.  She immediately iced it but has taken no pain relievers.  She has some pain on side to side movements of her left knee but has been able to ambulate without difficulty.  She denies any feelings of instability of the knee.  She notes that she has had some left shoulder pain, lower back pain, and left knee pain since her previous fall in February.  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  Health Maintenance:  Health Maintenance Due  Topic Date Due  . MAMMOGRAM  02/22/2017  . PAP SMEAR-Modifier  10/23/2017  . COLONOSCOPY  12/02/2017    -  reports that she has never smoked. She has never used smokeless tobacco. - Review of Systems: Per HPI. - Past Medical History: Patient Active Problem List   Diagnosis Date Noted  . Mixed hyperlipidemia 05/17/2018  . Contusion of left knee 04/10/2016  . Laryngopharyngeal reflux (LPR) 12/21/2015  . Essential hypertension 10/29/2015  . Ovarian cyst 12/12/2014  . Thoracic disc herniation 11/06/2014  . Abnormal glucose 04/18/2014  . Fatigue 04/18/2014  . Allergy   . Anxiety   . Insomnia   . Neck pain on right side 12/05/2011  . Major depressive disorder, recurrent ([place]) 03/10/2011    Class: Acute   - Medications: reviewed and updated   Objective:   Physical Exam BP 140/70   Ht 5\' 6"  (1.676 m)   Wt 184 lb (83.5 kg)   LMP 03/09/2011   BMI 29.70 kg/m  Gen: NAD,  alert, cooperative with exam, well-appearing, appears stated age Musculoskeletal: Knee, left: Inspection was negative for erythema, negative for effusion, and negative for obvious bony abnormalities. Palpation was negative for obvious Baker's cyst development, negative for asymmetric warmth, slightly positive for tenderness just medial to the patella, negative for condyle tenderness, negative for patellar tenderness, negative for patellar crepitus, and negative for tenderness of the pes anserine bursa. Patellar and quadriceps tendons  unremarkable. ROM normal in flexion (135 degrees) and extension (0 degrees) and lower leg rotation. Normal hamstring and quadriceps strength. Neurovascularly intact bilaterally.  - Ligaments: (Solid and consistent endpoints)   - ACL (present bilaterally)   - PCL (present bilaterally)   - LCL (present bilaterally)   - MCL (present bilaterally).   - Additional tests performed:    - Anterior Drawer >> negative   - Lachman >> negative  - Meniscus:   - Thessaly: negative   - McMurray's: negative   - Patella:   - Patellar grind/compression: negative.   - Patellar glide: Without apprehension     Assessment & Plan:   Contusion of left knee Knee exam is reassuring, and patient was told that she likely has a contusion of her left knee that should improve with time.  She was given quadricep strengthening exercises in order to lift her patella from  the intertrochlear groove of the femur and prevent future knee injury.  She was encouraged to stay active at least 30 minutes 3 times a week with the goal of increasing to 30 minutes 5 times weekly to help alleviate her back and joint pains.  She will follow-up with [place] as needed.    Maia Breslow, M.D. 07/29/2018, 5:36 PM PGY-2, [facility] I observed and examined the patient with the resident and agree with assessment and plan.  Note reviewed and modified by me. Ila Mcgill, MD

## 2018-08-26 ENCOUNTER — Other Ambulatory Visit: Payer: Self-pay

## 2018-08-26 ENCOUNTER — Other Ambulatory Visit: Payer: Self-pay | Admitting: Sports Medicine

## 2018-08-26 MED ORDER — HYDROCODONE-IBUPROFEN 7.5-200 MG PO TABS: 1.0000 | ORAL_TABLET | Freq: Three times a day (TID) | ORAL | 0 refills | Status: DC | PRN

## 2018-09-27 ENCOUNTER — Other Ambulatory Visit: Payer: Self-pay | Admitting: Sports Medicine

## 2018-09-27 ENCOUNTER — Other Ambulatory Visit: Payer: Self-pay

## 2018-09-27 ENCOUNTER — Other Ambulatory Visit: Payer: Self-pay | Admitting: *Deleted

## 2018-09-27 MED ORDER — HYDROCODONE-IBUPROFEN 7.5-200 MG PO TABS: 1.0000 | ORAL_TABLET | Freq: Three times a day (TID) | ORAL | 0 refills | Status: DC | PRN

## 2018-10-14 ENCOUNTER — Ambulatory Visit (INDEPENDENT_AMBULATORY_CARE_PROVIDER_SITE_OTHER): Payer: BC Managed Care – PPO | Admitting: Physician Assistant

## 2018-10-14 ENCOUNTER — Other Ambulatory Visit: Payer: Self-pay

## 2018-10-14 ENCOUNTER — Encounter: Payer: Self-pay | Admitting: Physician Assistant

## 2018-10-14 VITALS — BP 124/80 | HR 81 | Temp 97.5°F | Wt 187.0 lb

## 2018-10-14 DIAGNOSIS — K130 Diseases of lips: Secondary | ICD-10-CM | POA: Diagnosis not present

## 2018-10-14 DIAGNOSIS — R220 Localized swelling, mass and lump, head: Secondary | ICD-10-CM | POA: Diagnosis not present

## 2018-10-14 DIAGNOSIS — K140 Glossitis: Secondary | ICD-10-CM

## 2018-10-14 MED ORDER — DEXAMETHASONE SODIUM PHOSPHATE 100 MG/10ML IJ SOLN
10.0000 mg | Freq: Once | INTRAMUSCULAR | Status: AC
  Administered 2018-10-14: 10 mg via INTRAMUSCULAR

## 2018-10-14 MED ORDER — CLOBETASOL PROPIONATE 0.05 % EX OINT: 1.0000 "application " | TOPICAL_OINTMENT | Freq: Two times a day (BID) | CUTANEOUS | 0 refills | Status: DC

## 2018-10-14 MED ORDER — PREDNISONE 20 MG PO TABS: ORAL_TABLET | ORAL | 0 refills | Status: DC

## 2018-10-14 NOTE — Progress Notes (Signed)
Subjective:    Patient ID: Gabriela [name], female    DOB: 08/03/1955, 63 y.o.   MRN: [id number]  HPI 63 y.o. WF started with swelling in her mouth x August 1st.  She had burning/tingling and dryness in her lips and looked in the mirror had swelling of her lips. She states it burns a lot. She has been taking Neurontin and pain pills. She has tried acylovir 5%, clotimazole and hydrocortisone. The hydrocortisone helps the most.   She ate at [employer] that morning. Thinks it may be a mask, has been wearing M95 at work for 8 hours day.  She is not on an ACE, no trouble with swallowing.   Lab Results  Component Value Date   IRON 31 (L) 11/10/2017   TIBC 318 11/10/2017   FERRITIN 161 11/03/2016   Lab Results  Component Value Date   VITAMINB12 406 11/10/2017     Blood pressure 124/80, pulse 81, temperature (!) 97.5 F (36.4 C), weight 187 lb (84.8 kg), last menstrual period 03/09/2011, SpO2 97 %.  Medications Current Outpatient Medications on File Prior to Visit  Medication Sig  . acyclovir ointment (ZOVIRAX) 5 % Apply thin layer to affected area  . albuterol (VENTOLIN HFA) 108 (90 Base) MCG/ACT inhaler Inhale 2 puffs into the lungs every 4 (four) hours as needed for wheezing or shortness of breath.  . bisoprolol (ZEBETA) 5 MG tablet 1/2-1 tablet at night for blood pressure  . Cetirizine HCl (ZYRTEC ALLERGY PO) Take 1 tablet by mouth daily. Reported on 04/30/2015  . Cholecalciferol (VITAMIN D3) 5000 units CAPS Take 1 capsule by mouth daily.  . cyclobenzaprine (FLEXERIL) 5 MG tablet TAKE 1/2 TO 1 TABLET BY MOUTH EVERY NIGHT AT BEDTIME (Patient taking differently: Take 2.5-5 mg by mouth daily as needed for muscle spasms. )  . diazepam (VALIUM) 5 MG tablet Take 1/2 to 1 tablet 2 to 2 x / day & please try to limit to 5 days /week to avoid addiction  . EPINEPHrine (EPIPEN 2-PAK) 0.3 mg/0.3 mL DEVI Inject 0.3 mLs (0.3 mg total) into the muscle as needed (anaphylaxis).  Marland Kitchen gabapentin  (NEURONTIN) 300 MG capsule Take 1 capsule (300 mg total) by mouth 3 (three) times daily.  Marland Kitchen HYDROcodone-ibuprofen (VICOPROFEN) 7.5-200 MG tablet Take 1 tablet by mouth 3 (three) times daily as needed for moderate pain or severe pain.  Marland Kitchen lidocaine (XYLOCAINE) 2 % jelly apply small amount topically to the area 2-3 times a day  . Loperamide HCl (IMODIUM PO) Take by mouth as needed.  . Melatonin-Pyridoxine (MELATIN PO) Take by mouth.  . pantoprazole (PROTONIX) 40 MG tablet Take 1 tablet (40 mg total) by mouth 2 (two) times daily.  Marland Kitchen triamcinolone (NASACORT AQ) 55 MCG/ACT AERO nasal inhaler Place 2 sprays into the nose daily.  Marland Kitchen umeclidinium-vilanterol (ANORO ELLIPTA) 62.5-25 MCG/INH AEPB Inhale 1 puff into the lungs daily. Make sure you rinse your mouth after each use.   No current facility-administered medications on file prior to visit.     Problem list She has Major depressive disorder, recurrent ([place]); Neck pain on right side; Allergy; Anxiety; Insomnia; Abnormal glucose; Fatigue; Thoracic disc herniation; Ovarian cyst; Essential hypertension; Laryngopharyngeal reflux (LPR); Contusion of left knee; and Mixed hyperlipidemia on their problem list.    Review of Systems  Constitutional: Negative.  Negative for chills and fever.  HENT: Positive for facial swelling. Negative for dental problem, drooling, mouth sores, sinus pressure, sore throat, trouble swallowing and voice change.  Respiratory: Negative.   Cardiovascular: Negative.   Gastrointestinal: Negative.  Negative for diarrhea.  Genitourinary: Negative.   Musculoskeletal: Negative.  Negative for arthralgias.  Skin: Positive for rash. Negative for color change and wound.  Neurological: Negative.  Negative for dizziness and facial asymmetry.       Objective:   Physical Exam Constitutional:      General: She is not in acute distress.    Appearance: She is not ill-appearing.  HENT:     Right Ear: Tympanic membrane, ear canal and  external ear normal.     Left Ear: Tympanic membrane, ear canal and external ear normal.     Nose: Nose normal.     Mouth/Throat:     Mouth: Mucous membranes are moist.     Tongue: No lesions.     Palate: No mass and lesions.     Pharynx: Uvula midline. No pharyngeal swelling, oropharyngeal exudate, posterior oropharyngeal erythema or uvula swelling.     Comments: Erythematous, and swelling at edge of lips and corners of mouth, some erythema on her chin. Slight depapillation of left tongue, smooth, slightly erythematous but no obvious oral lesions  Eyes:     Extraocular Movements: Extraocular movements intact.     Pupils: Pupils are equal, round, and reactive to light.  Neck:     Musculoskeletal: Normal range of motion and neck supple. No muscular tenderness.  Cardiovascular:     Rate and Rhythm: Normal rate.  Pulmonary:     Effort: Pulmonary effort is normal.     Breath sounds: Normal breath sounds.  Lymphadenopathy:     Cervical: No cervical adenopathy.  Skin:    Findings: Rash present.  Neurological:     Mental Status: She is alert.             Assessment & Plan:    Glossitis/chelitis/lip swelling Does not appear to be angioedema, no ACE More likely irritant chelitits from prolonged mask use Check labs to rule out deficiency/autoimmune.  -     CBC with Differential/Platelet -     COMPLETE METABOLIC PANEL WITH GFR -     TSH -     Iron,Total/Total Iron Binding Cap -     Ferritin -     Vitamin B12 -     C3 and C4 -     Anti-DNA antibody, double-stranded -     ANA -     Sjogrens syndrome-A extractable nuclear antibody -     Sjogrens syndrome-B extractable nuclear antibody -     Anti-Smith antibody -     Sedimentation rate -     RNP Antibody  Cheilitis -     CBC with Differential/Platelet -     COMPLETE METABOLIC PANEL WITH GFR -     TSH -     Iron,Total/Total Iron Binding Cap -     Ferritin -     Vitamin B12 -     C3 and C4 -     Anti-DNA antibody,  double-stranded -     ANA -     Sjogrens syndrome-A extractable nuclear antibody -     Sjogrens syndrome-B extractable nuclear antibody -     Anti-Smith antibody -     Sedimentation rate -     RNP Antibody  Lip swelling -     CBC with Differential/Platelet -     COMPLETE METABOLIC PANEL WITH GFR -     TSH -     Iron,Total/Total Iron Binding Cap -  Ferritin -     Vitamin B12 -     C3 and C4 -     Anti-DNA antibody, double-stranded -     ANA -     Sjogrens syndrome-A extractable nuclear antibody -     Sjogrens syndrome-B extractable nuclear antibody -     Anti-Smith antibody -     Sedimentation rate -     RNP Antibody -     clobetasol ointment (TEMOVATE) 0.05 %; Apply 1 application topically 2 (two) times daily. -     predniSONE (DELTASONE) 20 MG tablet; 2 tablets daily for 3 days, 1 tablet daily for 4 days. -     dexamethasone (DECADRON) injection 10 mg

## 2018-10-14 NOTE — Patient Instructions (Signed)
Angioedema Angioedema is the sudden swelling of tissue in the body. Angioedema can affect any part of the body, but it most often affects the deeper parts of the skin, causing red, itchy patches (hives) to appear over the affected area. It often begins during the night and is found in the morning. Depending on the cause, angioedema may happen:  Only once.  Several times. It may come back in unpredictable patterns.  Repeatedly for several years. Over time, it may gradually stop coming back. Angioedema can be life-threatening if it affects the air passages that you breathe through. What are the causes? This condition may be caused by:  Foods, such as milk, eggs, shellfish, wheat, or nuts.  Certain medicines, such as ACE inhibitors, antibiotics, nonsteroidal anti-inflammatory drugs, birth control pills, or dyes used in X-rays.  Insect stings.  Infections. Angioedema can be inherited, and episodes can be triggered by:  Mild injury.  Dental work.  Surgery.  Stress.  Sudden changes in temperature.  Exercise. In some cases, the cause of this condition is not known. What are the signs or symptoms? Symptoms of this condition depend on where the swelling happens. Symptoms may include:  Swollen skin.  Red, itchy patches of skin (hives).  Redness in the affected area.  Pain in the affected area.  Swollen lips or tongue.  Wheezing.  Breathing problems.  If your internal organs are involved, symptoms may also include:  Nausea.  Abdominal pain.  Vomiting.  Difficulty swallowing.  Difficulty passing urine. How is this diagnosed? This condition may be diagnosed based on:  An exam of the affected area.  Your medical history.  Whether anyone in your family has had this condition before.  A review of any medicines you have been taking.  Tests, including: ? Allergy skin tests to see if the condition was caused by an allergic reaction. ? Blood tests to see if the  condition was caused by a gene. ? Tests to check for underlying diseases that could cause the condition. How is this treated? Treatment for this condition depends on the cause. It may involve any of the following:  If something triggered the condition, making changes to keep it from triggering the condition again.  If the condition affects your breathing, having tubes placed in your airway to keep it open.  Taking medicines to treat symptoms or prevent future episodes. These may include: ? Antihistamines. ? Epinephrine injections. ? Steroids. If your condition is severe, you may need to be treated at the hospital. Angioedema usually gets better in 24-48 hours. Follow these instructions at home:  Take over-the-counter and prescription medicines only as told by your health care provider.  If you were given medicines for emergency allergy treatment, always carry them with you.  Wear a medical bracelet as told by your health care provider.  If something triggers your condition, avoid the trigger, if possible.  If your condition is inherited and you are thinking about having children, talk to your health care provider. It is important to discuss the risks of passing on the condition to your children. Contact a health care provider if:  You have repeated episodes of angioedema.  Episodes of angioedema start to happen more often than they used to, even after you take steps to prevent them.  You have episodes of angioedema that are more severe than they have been before, even after you take steps to prevent them.  You are thinking about having children. Get help right away if:  You   have severe swelling of your mouth, tongue, or lips.  You have trouble breathing.  You have trouble swallowing.  You faint. This information is not intended to replace advice given to you by your health care provider. Make sure you discuss any questions you have with your health care provider. Document  Released: 04/07/2001 Document Revised: 01/09/2017 Document Reviewed: 08/07/2015 Elsevier Patient Education  [place] is inflammation of the tongue. This may be a stand-alone condition, or it may be a symptom of a different condition that you have. Generally, glossitis goes away when its cause is identified and treated. Glossitis can be dangerous if it causes difficulty breathing. What are the causes? This condition may be caused by many different things. Common causes include:  Viral, bacterial, or yeast infections.  Allergies.  Disorders that affect the skin and mucous membranes, such as certain autoimmune disorders.  Abnormal tissue growths (tumors).  Lack of healthy red blood cells (anemia).  Movement of stomach acid into the muscular tube that connects the mouth and the stomach (gastroesophageal reflux).  Lack of proper nutrition or certain vitamins.  Certain lifelong (chronic) medical conditions, such as diabetes. Sometimes, glossitis may not be caused by an underlying condition. In these cases, glossitis may be caused by:  Use of tobacco products, such as cigarettes, chewing tobacco, or e-cigarettes.  Excessive alcohol use.  Tongue injury or irritation.  Certain medicines, such as medicines to treat cancer. In some cases, the cause is not known. What increases the risk? The following factors may make you more likely to develop this condition:  Being a man.  Taking antibiotics or steroids, such as asthma medicines.  Drinking alcohol excessively.  Using tobacco products, such as cigarettes, chewing tobacco, or e-cigarettes.  Having a chronic medical condition, such as an immune disease or cancer.  Not brushing or flossing your teeth regularly.  Being anemic or not getting proper nutrition. What are the signs or symptoms? Symptoms of this condition vary depending on the cause. Symptoms may include:  Swelling of the  tongue.  Pain and tenderness in the tongue. Sometimes, this condition is painless.  Changes in tongue color. The tongue may be pale or bright red.  Smooth areas on the tongue's surface.  A small mass of tissue (node) or white patch on the tongue.  Difficulty chewing, swallowing, or talking.  Difficulty breathing. How is this diagnosed? This condition is diagnosed based on a physical exam and medical history. Your health care provider may ask you about your eating and drinking habits. You may also have tests, including:  Blood tests.  Removal of a small amount of cells from the tongue that are examined under a microscope (biopsy). You may be given the name of a dentist or a health care provider who specializes in ear, nose, and throat (ENT) problems (otolaryngologist). How is this treated? Treatment for this condition depends on the cause and may include:  Following instructions from your health care provider about keeping your mouth clean and avoiding irritants that may have caused your condition or made it worse.  Nutritional therapy. Your health care provider may tell you to change your eating and drinking habits or take a nutritional supplement.  Managing underlying conditions that may have caused your glossitis.  Medicines, such as: ? Corticosteroids to reduce inflammation. ? Antibiotics if your condition was caused by an infection. ? Medicines that numb your tongue or mouth (local anesthetics). Follow these instructions at home: Eating and drinking  Eat healthy foods. Follow instructions from your health care provider about eating or drinking restrictions.  If you drink alcohol, limit how much you have: ? 0-1 drink a day for women. ? 0-2 drinks a day for men.  Be aware of how much alcohol is in your drink. In the [place] one drink equals one 12 oz bottle of beer (355 mL), one 5 oz glass of wine (148 mL), or one 1 oz glass of hard liquor (44 mL). Mouth care   Keep  your teeth and mouth clean. This includes brushing and flossing frequently and having regular dental checkups.  If you wear dentures or dental braces, work with your dentist to make sure they fit correctly.  Follow other instructions from your health care provider about how to take care of your mouth. He or she may recommend that you: ? Gently brush your tongue. ? Gargle with a salt-water mixture 3-4 times a day or as needed. To make a salt-water mixture, completely dissolve -1 tsp of salt in 1 cup of warm water. ? Avoid any irritants that may have caused your condition or made it worse, such as chemicals or certain foods. ? Avoid breath mints, antibacterial mouthwash, and chewing gum. General instructions   Do not use any products that contain nicotine or tobacco, such as cigarettes and e-cigarettes. If you need help quitting, ask your health care provider.  Take over-the-counter and prescription medicines only as told by your health care provider.  Take supplements only as told by your health care provider. Follow the directions carefully.  Keep all follow-up visits as told by your health care provider. This is important. Contact a health care provider if:  You have a fever.  You develop new symptoms.  You have symptoms that do not get better with medicine or get worse.  You have symptoms that do not go away after 10 days.  You cannot eat or drink because of your pain. Get help right away if:  You have severe pain or swelling.  You have difficulty breathing, swallowing, or talking. Summary  Glossitis is inflammation of the tongue. It can be caused by many different things.  Glossitis generally goes away when its cause is identified and treated.  Keep your teeth and mouth clean. This includes brushing and flossing frequently and having regular dental checkups.  Eat healthy foods. Follow instructions from your health care provider about eating or drinking  restrictions. This information is not intended to replace advice given to you by your health care provider. Make sure you discuss any questions you have with your health care provider. Document Released: 01/17/2002 Document Revised: 05/25/2017 Document Reviewed: 05/25/2017 Elsevier Patient Education  [place].

## 2018-10-15 LAB — ANTI-SMITH ANTIBODY: ENA SM Ab Ser-aCnc: <1 AI

## 2018-10-15 LAB — RNP ANTIBODY: Ribonucleic Protein(ENA) Antibody, IgG: <1 AI

## 2018-10-15 LAB — SJOGRENS SYNDROME-B EXTRACTABLE NUCLEAR ANTIBODY: SSB (La) (ENA) Antibody, IgG: <1 AI

## 2018-10-15 LAB — C3 AND C4
C3 Complement: 142 mg/dL (ref 83–193)
C4 Complement: 29 mg/dL (ref 15–57)

## 2018-10-15 LAB — SJOGRENS SYNDROME-A EXTRACTABLE NUCLEAR ANTIBODY: SSA (Ro) (ENA) Antibody, IgG: <1 AI

## 2018-10-19 LAB — ANTI-NUCLEAR AB-TITER (ANA TITER)
ANA TITER: 1:40 {titer} — ABNORMAL HIGH
ANA TITER: 1:80 {titer} — ABNORMAL HIGH
ANA Titer 1: 1:80 {titer} — ABNORMAL HIGH

## 2018-10-19 LAB — CBC WITH DIFFERENTIAL/PLATELET
Absolute Monocytes: 653 cells/uL (ref 200–950)
Basophils Absolute: 51 cells/uL (ref 0–200)
Basophils Relative: 0.8 %
Eosinophils Absolute: 333 cells/uL (ref 15–500)
Eosinophils Relative: 5.2 %
HCT: 36.7 % (ref 35.0–45.0)
Hemoglobin: 12.4 g/dL (ref 11.7–15.5)
Lymphs Abs: 2131 cells/uL (ref 850–3900)
MCH: 30 pg (ref 27.0–33.0)
MCHC: 33.8 g/dL (ref 32.0–36.0)
MCV: 88.9 fL (ref 80.0–100.0)
MPV: 9.5 fL (ref 7.5–12.5)
Monocytes Relative: 10.2 %
Neutro Abs: 3232 cells/uL (ref 1500–7800)
Neutrophils Relative %: 50.5 %
Platelets: 303 10*3/uL (ref 140–400)
RBC: 4.13 10*6/uL (ref 3.80–5.10)
RDW: 12 % (ref 11.0–15.0)
Total Lymphocyte: 33.3 %
WBC: 6.4 10*3/uL (ref 3.8–10.8)

## 2018-10-19 LAB — COMPLETE METABOLIC PANEL WITH GFR
AG Ratio: 1.8 (calc) (ref 1.0–2.5)
ALT: 27 U/L (ref 6–29)
AST: 24 U/L (ref 10–35)
Albumin: 4.4 g/dL (ref 3.6–5.1)
Alkaline phosphatase (APISO): 43 U/L (ref 37–153)
BUN/Creatinine Ratio: 30 (calc) — ABNORMAL HIGH (ref 6–22)
BUN: 28 mg/dL — ABNORMAL HIGH (ref 7–25)
CO2: 25 mmol/L (ref 20–32)
Calcium: 10.5 mg/dL — ABNORMAL HIGH (ref 8.6–10.4)
Chloride: 105 mmol/L (ref 98–110)
Creat: 0.92 mg/dL (ref 0.50–0.99)
GFR, Est African American: 78 mL/min/{1.73_m2} (ref >=60)
GFR, Est Non African American: 67 mL/min/{1.73_m2} (ref >=60)
Globulin: 2.4 g/dL (calc) (ref 1.9–3.7)
Glucose, Bld: 108 mg/dL — ABNORMAL HIGH (ref 65–99)
Potassium: 4.1 mmol/L (ref 3.5–5.3)
Sodium: 137 mmol/L (ref 135–146)
Total Bilirubin: 0.5 mg/dL (ref 0.2–1.2)
Total Protein: 6.8 g/dL (ref 6.1–8.1)

## 2018-10-19 LAB — SEDIMENTATION RATE: Sed Rate: 6 mm/h (ref 0–30)

## 2018-10-19 LAB — IRON, TOTAL/TOTAL IRON BINDING CAP
%SAT: 31 % (calc) (ref 16–45)
Iron: 104 ug/dL (ref 45–160)
TIBC: 337 mcg/dL (calc) (ref 250–450)

## 2018-10-19 LAB — ANTI-DNA ANTIBODY, DOUBLE-STRANDED: ds DNA Ab: 2 IU/mL

## 2018-10-19 LAB — FERRITIN: Ferritin: 146 ng/mL (ref 16–288)

## 2018-10-19 LAB — TSH: TSH: 2.96 mIU/L (ref 0.40–4.50)

## 2018-10-19 LAB — ANA: Anti Nuclear Antibody (ANA): POSITIVE — AB

## 2018-10-19 LAB — VITAMIN B12: Vitamin B-12: 654 pg/mL (ref 200–1100)

## 2018-10-20 ENCOUNTER — Other Ambulatory Visit: Payer: Self-pay | Admitting: Physician Assistant

## 2018-10-20 DIAGNOSIS — R220 Localized swelling, mass and lump, head: Secondary | ICD-10-CM

## 2018-10-20 DIAGNOSIS — M2550 Pain in unspecified joint: Secondary | ICD-10-CM

## 2018-10-20 DIAGNOSIS — R768 Other specified abnormal immunological findings in serum: Secondary | ICD-10-CM

## 2018-10-26 ENCOUNTER — Other Ambulatory Visit: Payer: Self-pay | Admitting: Sports Medicine

## 2018-10-26 MED ORDER — HYDROCODONE-IBUPROFEN 7.5-200 MG PO TABS: 1.0000 | ORAL_TABLET | Freq: Three times a day (TID) | ORAL | 0 refills | Status: DC | PRN

## 2018-11-16 ENCOUNTER — Encounter: Payer: Self-pay | Admitting: Adult Health
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
## 2018-11-22 ENCOUNTER — Encounter: Payer: Self-pay | Admitting: Physician Assistant
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
## 2018-11-24 ENCOUNTER — Other Ambulatory Visit: Payer: Self-pay

## 2018-11-25 ENCOUNTER — Ambulatory Visit (INDEPENDENT_AMBULATORY_CARE_PROVIDER_SITE_OTHER): Payer: BC Managed Care – PPO | Admitting: Physician Assistant

## 2018-11-25 ENCOUNTER — Other Ambulatory Visit: Payer: Self-pay

## 2018-11-25 DIAGNOSIS — Z1159 Encounter for screening for other viral diseases: Secondary | ICD-10-CM | POA: Diagnosis not present

## 2018-11-25 DIAGNOSIS — R350 Frequency of micturition: Secondary | ICD-10-CM

## 2018-11-25 DIAGNOSIS — I10 Essential (primary) hypertension: Secondary | ICD-10-CM

## 2018-11-25 DIAGNOSIS — K1379 Other lesions of oral mucosa: Secondary | ICD-10-CM

## 2018-11-25 DIAGNOSIS — R21 Rash and other nonspecific skin eruption: Secondary | ICD-10-CM

## 2018-11-25 DIAGNOSIS — Z124 Encounter for screening for malignant neoplasm of cervix: Secondary | ICD-10-CM

## 2018-11-25 MED ORDER — FIRST-DUKES MOUTHWASH MT SUSP: OROMUCOSAL | 3 refills | Status: DC

## 2018-11-25 NOTE — Patient Instructions (Signed)
Urinary Frequency, Adult Urinary frequency means urinating more often than usual. You may urinate every 1-2 hours even though you drink a normal amount of fluid and do not have a bladder infection or condition. Although you urinate more often than normal, the total amount of urine produced in a day is normal. With urinary frequency, you may have an urgent need to urinate often. The stress and anxiety of needing to find a bathroom quickly can make this urge worse. This condition may go away on its own or you may need treatment at home. Home treatment may include bladder training, exercises, taking medicines, or making changes to your diet. Follow these instructions at home: Bladder health   Keep a bladder diary if told by your health care provider. Keep track of: ? What you eat and drink. ? How often you urinate. ? How much you urinate.  Follow a bladder training program if told by your health care provider. This may include: ? Learning to delay going to the bathroom. ? Double urinating (voiding). This helps if you are not completely emptying your bladder. ? Scheduled voiding.  Do Kegel exercises as told by your health care provider. Kegel exercises strengthen the muscles that help control urination, which may help the condition. Eating and drinking  If told by your health care provider, make diet changes, such as: ? Avoiding caffeine. ? Drinking fewer fluids, especially alcohol. ? Not drinking in the evening. ? Avoiding foods or drinks that may irritate the bladder. These include coffee, tea, soda, artificial sweeteners, citrus, tomato-based foods, and chocolate. ? Eating foods that help prevent or ease constipation. Constipation can make this condition worse. Your health care provider may recommend that you:  Drink enough fluid to keep your urine pale yellow.  Take over-the-counter or prescription medicines.  Eat foods that are high in fiber, such as beans, whole grains, and fresh  fruits and vegetables.  Limit foods that are high in fat and processed sugars, such as fried or sweet foods. General instructions  Take over-the-counter and prescription medicines only as told by your health care provider.  Keep all follow-up visits as told by your health care provider. This is important. Contact a health care provider if:  You start urinating more often.  You feel pain or irritation when you urinate.  You notice blood in your urine.  Your urine looks cloudy.  You develop a fever.  You begin vomiting. Get help right away if:  You are unable to urinate. Summary  Urinary frequency means urinating more often than usual. With urinary frequency, you may urinate every 1-2 hours even though you drink a normal amount of fluid and do not have a bladder infection or other bladder condition.  Your health care provider may recommend that you keep a bladder diary, follow a bladder training program, or make dietary changes.  If told by your health care provider, do Kegel exercises to strengthen the muscles that help control urination.  Take over-the-counter and prescription medicines only as told by your health care provider.  Contact a health care provider if your symptoms do not improve or get worse. This information is not intended to replace advice given to you by your health care provider. Make sure you discuss any questions you have with your health care provider. Document Released: 11/23/2008 Document Revised: 08/06/2017 Document Reviewed: 08/06/2017 Elsevier Patient Education  2020 [place].   Lichen Sclerosus Lichen sclerosus is a skin problem. It can happen on any part of the  body, but it commonly involves the anal or genital areas. It can cause itching and discomfort in these areas. Treatment can help to control symptoms. When the genital area is affected, getting treatment is important because the condition can cause scarring that may lead to other  problems. What are the causes? The cause of this condition is not known. It may be related to an overactive immune system or a lack of certain hormones. Lichen sclerosus is not an infection or a fungus, and it is not passed from one person to another (not contagious). What increases the risk? This condition is more likely to develop in women, usually after menopause. What are the signs or symptoms? Symptoms of this condition include:  Thin, wrinkled, white areas on the skin.  Thickened white areas on the skin.  Red and swollen patches (lesions) on the skin.  Tears or cracks in the skin.  Bruising.  Blood blisters.  Severe itching.  Pain, itching, or burning when urinating. Constipation is also common in people with lichen sclerosus. How is this diagnosed? This condition may be diagnosed with a physical exam. In some cases, a tissue sample (biopsy sample) may be removed to be looked at under a microscope. How is this treated? This condition is usually treated with medicated creams or ointments (topical steroids) that are applied over the affected areas. In some cases, treatment may also include medicines that are taken by mouth. Surgery may be needed in more severe cases that are causing problems such as scarring. Follow these instructions at home:  Take or use over-the-counter and prescription medicines only as told by your health care provider.  Use creams or ointments as told by your health care provider.  Do not scratch the affected areas of skin.  If you are a woman, be sure to keep the vaginal area as clean and dry as possible.  Clean the affected area of skin gently with water. Avoid using rough towels or toilet paper.  Keep all follow-up visits as told by your health care provider. This is important. Contact a health care provider if:  You have increasing redness, swelling, or pain in the affected area.  You have fluid, blood, or pus coming from the affected area.   You have new lesions on your skin.  You have a fever.  You have pain during sex. Summary  Lichen sclerosus is a skin problem. When the genital area is affected, getting treatment is important because the condition can cause scarring that may lead to other problems.  This condition is usually treated with medicated creams or ointments (topical steroids) that are applied over the affected areas.  Take or use over-the-counter and prescription medicines only as told by your health care provider.  Contact a health care provider if you have new lesions on your skin, have pain during sex, or have increasing redness, swelling, or pain in the affected area.  Keep all follow-up visits as told by your health care provider. This is important. This information is not intended to replace advice given to you by your health care provider. Make sure you discuss any questions you have with your health care provider. Document Released: 06/19/2010 Document Revised: 06/11/2017 Document Reviewed: 06/11/2017 Elsevier Patient Education  2020 [employer].

## 2018-11-26 ENCOUNTER — Encounter: Payer: Self-pay | Admitting: Family Medicine

## 2018-11-26 ENCOUNTER — Other Ambulatory Visit: Payer: Self-pay | Admitting: Family Medicine

## 2018-11-26 LAB — URINALYSIS, ROUTINE W REFLEX MICROSCOPIC
Bilirubin Urine: NEGATIVE
Glucose, UA: NEGATIVE
Hgb urine dipstick: NEGATIVE
Ketones, ur: NEGATIVE
Leukocytes,Ua: NEGATIVE
Nitrite: NEGATIVE
Protein, ur: NEGATIVE
Specific Gravity, Urine: 1.025 (ref 1.001–1.03)
pH: <=5 (ref 5.0–8.0)

## 2018-11-26 LAB — COMPLETE METABOLIC PANEL WITH GFR
AG Ratio: 1.8 (calc) (ref 1.0–2.5)
ALT: 24 U/L (ref 6–29)
AST: 17 U/L (ref 10–35)
Albumin: 4.4 g/dL (ref 3.6–5.1)
Alkaline phosphatase (APISO): 42 U/L (ref 37–153)
BUN/Creatinine Ratio: 29 (calc) — ABNORMAL HIGH (ref 6–22)
BUN: 29 mg/dL — ABNORMAL HIGH (ref 7–25)
CO2: 30 mmol/L (ref 20–32)
Calcium: 10.9 mg/dL — ABNORMAL HIGH (ref 8.6–10.4)
Chloride: 103 mmol/L (ref 98–110)
Creat: 0.99 mg/dL (ref 0.50–0.99)
GFR, Est African American: 71 mL/min/{1.73_m2} (ref >=60)
GFR, Est Non African American: 61 mL/min/{1.73_m2} (ref >=60)
Globulin: 2.5 g/dL (calc) (ref 1.9–3.7)
Glucose, Bld: 123 mg/dL — ABNORMAL HIGH (ref 65–99)
Potassium: 4.2 mmol/L (ref 3.5–5.3)
Sodium: 140 mmol/L (ref 135–146)
Total Bilirubin: 0.4 mg/dL (ref 0.2–1.2)
Total Protein: 6.9 g/dL (ref 6.1–8.1)

## 2018-11-26 LAB — CBC WITH DIFFERENTIAL/PLATELET
Absolute Monocytes: 898 cells/uL (ref 200–950)
Basophils Absolute: 61 cells/uL (ref 0–200)
Basophils Relative: 0.6 %
Eosinophils Absolute: 71 cells/uL (ref 15–500)
Eosinophils Relative: 0.7 %
HCT: 39.4 % (ref 35.0–45.0)
Hemoglobin: 12.9 g/dL (ref 11.7–15.5)
Lymphs Abs: 2519 cells/uL (ref 850–3900)
MCH: 29.5 pg (ref 27.0–33.0)
MCHC: 32.7 g/dL (ref 32.0–36.0)
MCV: 90 fL (ref 80.0–100.0)
MPV: 9.6 fL (ref 7.5–12.5)
Monocytes Relative: 8.8 %
Neutro Abs: 6650 cells/uL (ref 1500–7800)
Neutrophils Relative %: 65.2 %
Platelets: 356 10*3/uL (ref 140–400)
RBC: 4.38 10*6/uL (ref 3.80–5.10)
RDW: 12.1 % (ref 11.0–15.0)
Total Lymphocyte: 24.7 %
WBC: 10.2 10*3/uL (ref 3.8–10.8)

## 2018-11-26 LAB — HSV(HERPES SIMPLEX VRS) I + II AB-IGG
HAV 1 IGG,TYPE SPECIFIC AB: 16.6 index — ABNORMAL HIGH
HSV 2 IGG,TYPE SPECIFIC AB: <0.9 index

## 2018-11-26 LAB — URINE CULTURE
MICRO NUMBER:: 994997
SPECIMEN QUALITY:: ADEQUATE

## 2018-11-26 MED ORDER — HYDROCODONE-IBUPROFEN 7.5-200 MG PO TABS: 1.0000 | ORAL_TABLET | Freq: Three times a day (TID) | ORAL | 0 refills | Status: DC | PRN

## 2018-11-26 NOTE — Progress Notes (Signed)
Sending in refill of her hydrocodone for Dr. Oneida Alar.  Reviewed database

## 2018-11-27 LAB — PAP, TP IMAGING W/ HPV RNA, RFLX HPV TYPE 16,18/45: HPV DNA High Risk: NOT DETECTED

## 2018-11-27 LAB — PAP, TP IMAGING, WNL RFLX HPV

## 2018-12-07 DIAGNOSIS — K130 Diseases of lips: Secondary | ICD-10-CM | POA: Diagnosis not present

## 2018-12-07 DIAGNOSIS — M26629 Arthralgia of temporomandibular joint, unspecified side: Secondary | ICD-10-CM | POA: Diagnosis not present

## 2018-12-07 DIAGNOSIS — K146 Glossodynia: Secondary | ICD-10-CM | POA: Diagnosis not present

## 2018-12-07 DIAGNOSIS — R682 Dry mouth, unspecified: Secondary | ICD-10-CM | POA: Diagnosis not present
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
## 2018-12-16 ENCOUNTER — Encounter: Payer: Self-pay | Admitting: Rheumatology

## 2018-12-16 ENCOUNTER — Ambulatory Visit (INDEPENDENT_AMBULATORY_CARE_PROVIDER_SITE_OTHER): Payer: BC Managed Care – PPO | Admitting: Rheumatology

## 2018-12-16 ENCOUNTER — Other Ambulatory Visit: Payer: Self-pay

## 2018-12-16 ENCOUNTER — Ambulatory Visit
Admission: RE | Admit: 2018-12-16 | Discharge: 2018-12-16 | Disposition: A | Discharge status: Home / Self Care | Payer: BC Managed Care – PPO | Source: Ambulatory Visit | Attending: Physician Assistant | Admitting: Physician Assistant

## 2018-12-16 VITALS — BP 143/83 | HR 86 | Resp 16 | Ht 66.0 in | Wt 190.2 lb

## 2018-12-16 DIAGNOSIS — K219 Gastro-esophageal reflux disease without esophagitis: Secondary | ICD-10-CM

## 2018-12-16 DIAGNOSIS — R220 Localized swelling, mass and lump, head: Secondary | ICD-10-CM

## 2018-12-16 DIAGNOSIS — G4709 Other insomnia: Secondary | ICD-10-CM

## 2018-12-16 DIAGNOSIS — E782 Mixed hyperlipidemia: Secondary | ICD-10-CM

## 2018-12-16 DIAGNOSIS — R768 Other specified abnormal immunological findings in serum: Secondary | ICD-10-CM

## 2018-12-16 DIAGNOSIS — N83202 Unspecified ovarian cyst, left side: Secondary | ICD-10-CM

## 2018-12-16 DIAGNOSIS — R21 Rash and other nonspecific skin eruption: Secondary | ICD-10-CM

## 2018-12-16 DIAGNOSIS — M5030 Other cervical disc degeneration, unspecified cervical region: Secondary | ICD-10-CM

## 2018-12-16 DIAGNOSIS — M5134 Other intervertebral disc degeneration, thoracic region: Secondary | ICD-10-CM

## 2018-12-16 DIAGNOSIS — M5124 Other intervertebral disc displacement, thoracic region: Secondary | ICD-10-CM

## 2018-12-16 DIAGNOSIS — I10 Essential (primary) hypertension: Secondary | ICD-10-CM

## 2018-12-16 DIAGNOSIS — F3341 Major depressive disorder, recurrent, in partial remission: Secondary | ICD-10-CM

## 2018-12-16 DIAGNOSIS — R5383 Other fatigue: Secondary | ICD-10-CM

## 2018-12-16 MED ORDER — CLOTRIMAZOLE 1 % EX CREA: 1.0000 "application " | TOPICAL_CREAM | Freq: Two times a day (BID) | CUTANEOUS | 0 refills | Status: DC

## 2018-12-16 MED ORDER — FAMOTIDINE 20 MG PO TABS: 20.0000 mg | ORAL_TABLET | Freq: Two times a day (BID) | ORAL | 1 refills | Status: DC

## 2018-12-17 DIAGNOSIS — B3783 Candidal cheilitis: Secondary | ICD-10-CM | POA: Diagnosis not present

## 2018-12-17 DIAGNOSIS — L710 Perioral dermatitis: Secondary | ICD-10-CM | POA: Diagnosis not present

## 2018-12-20 ENCOUNTER — Ambulatory Visit: Payer: Self-pay | Admitting: Rheumatology

## 2018-12-21 ENCOUNTER — Ambulatory Visit: Payer: BC Managed Care – PPO | Admitting: Allergy & Immunology

## 2018-12-23 ENCOUNTER — Other Ambulatory Visit: Payer: Self-pay | Admitting: Sports Medicine

## 2018-12-23 ENCOUNTER — Other Ambulatory Visit: Payer: Self-pay

## 2018-12-23 MED ORDER — HYDROCODONE-IBUPROFEN 7.5-200 MG PO TABS: 1.0000 | ORAL_TABLET | Freq: Three times a day (TID) | ORAL | 0 refills | Status: DC | PRN

## 2018-12-30 ENCOUNTER — Ambulatory Visit: Payer: Self-pay | Admitting: Rheumatology

## 2019-01-16 MED ORDER — LIDOCAINE HCL URETHRAL/MUCOSAL 2 % EX GEL: CUTANEOUS | 1 refills | Status: DC

## 2019-01-20 ENCOUNTER — Other Ambulatory Visit: Payer: Self-pay

## 2019-01-20 ENCOUNTER — Other Ambulatory Visit: Payer: Self-pay | Admitting: *Deleted

## 2019-01-20 ENCOUNTER — Other Ambulatory Visit: Payer: Self-pay | Admitting: Sports Medicine

## 2019-01-20 MED ORDER — HYDROCODONE-IBUPROFEN 7.5-200 MG PO TABS: 1.0000 | ORAL_TABLET | Freq: Three times a day (TID) | ORAL | 0 refills | Status: DC | PRN

## 2019-02-21 ENCOUNTER — Other Ambulatory Visit: Payer: Self-pay

## 2019-02-22 ENCOUNTER — Other Ambulatory Visit: Payer: Self-pay | Admitting: Sports Medicine

## 2019-02-22 MED ORDER — HYDROCODONE-IBUPROFEN 7.5-200 MG PO TABS: 1.0000 | ORAL_TABLET | Freq: Three times a day (TID) | ORAL | 0 refills | Status: DC | PRN

## 2019-02-23 DIAGNOSIS — E559 Vitamin D deficiency, unspecified: Secondary | ICD-10-CM | POA: Insufficient documentation

## 2019-02-23 DIAGNOSIS — Z79899 Other long term (current) drug therapy: Secondary | ICD-10-CM | POA: Insufficient documentation
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
## 2019-02-28 ENCOUNTER — Encounter: Payer: BC Managed Care – PPO | Admitting: Physician Assistant
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
## 2019-03-01 ENCOUNTER — Encounter: Payer: BC Managed Care – PPO | Admitting: Physician Assistant

## 2019-03-22 ENCOUNTER — Other Ambulatory Visit: Payer: Self-pay

## 2019-03-22 ENCOUNTER — Telehealth: Payer: Self-pay

## 2019-03-22 NOTE — Telephone Encounter (Signed)
Would like to come in on Wednesday and have labs done prior to her appointment with you on Friday. Please advise.

## 2019-03-23 ENCOUNTER — Telehealth: Payer: Self-pay | Admitting: Physician Assistant

## 2019-03-23 ENCOUNTER — Other Ambulatory Visit: Payer: Self-pay | Admitting: Physician Assistant

## 2019-03-23 ENCOUNTER — Telehealth: Payer: Self-pay

## 2019-03-23 DIAGNOSIS — E559 Vitamin D deficiency, unspecified: Secondary | ICD-10-CM

## 2019-03-23 DIAGNOSIS — Z130 Encounter for screening for diseases of the blood and blood-forming organs and certain disorders involving the immune mechanism: Secondary | ICD-10-CM

## 2019-03-23 DIAGNOSIS — R7309 Other abnormal glucose: Secondary | ICD-10-CM

## 2019-03-23 DIAGNOSIS — Z79899 Other long term (current) drug therapy: Secondary | ICD-10-CM

## 2019-03-23 DIAGNOSIS — E782 Mixed hyperlipidemia: Secondary | ICD-10-CM

## 2019-03-23 DIAGNOSIS — I10 Essential (primary) hypertension: Secondary | ICD-10-CM

## 2019-03-23 NOTE — Telephone Encounter (Signed)
-----   Message from Gabriela [name], [place] sent at 03/23/2019  9:11 AM EST ----- Regarding: LABS Contact: [phone] OFFICE NOTE: Patient would like to know if she can come in for a LAB visit before her appt on 03/25/2019.

## 2019-03-23 NOTE — Telephone Encounter (Signed)
-----   Message from Vicie Mutters, [place] sent at 03/23/2019 12:36 PM EST ----- Regarding: FW: LABS Contact: [phone] Put in labs ----- Message ----- From: Elenor Quinones, CMA Sent: 03/23/2019   9:11 AM EST To: Vicie Mutters, PA-C Subject: LABS                                           OFFICE NOTE: Patient would like to know if she can come in for a LAB visit before her appt on 03/25/2019.

## 2019-03-23 NOTE — Telephone Encounter (Signed)
Left voice message to inform patient that her LABs have been put in by provider & when she gets the message to call our office & schedule her a LAB ONLY visit before her appt 03/25/2019

## 2019-03-24 ENCOUNTER — Emergency Department (HOSPITAL_COMMUNITY): Payer: BC Managed Care – PPO

## 2019-03-24 ENCOUNTER — Encounter (HOSPITAL_COMMUNITY): Payer: Self-pay

## 2019-03-24 ENCOUNTER — Other Ambulatory Visit: Payer: Self-pay

## 2019-03-24 ENCOUNTER — Emergency Department (HOSPITAL_COMMUNITY)
Admission: EM | Admit: 2019-03-24 | Discharge: 2019-03-25 | Disposition: A | Discharge status: Home / Self Care | Payer: BC Managed Care – PPO | Attending: Emergency Medicine | Admitting: Emergency Medicine

## 2019-03-24 ENCOUNTER — Other Ambulatory Visit: Payer: Self-pay | Admitting: Sports Medicine

## 2019-03-24 DIAGNOSIS — S299XXA Unspecified injury of thorax, initial encounter: Secondary | ICD-10-CM | POA: Diagnosis not present

## 2019-03-24 DIAGNOSIS — R079 Chest pain, unspecified: Secondary | ICD-10-CM | POA: Diagnosis not present

## 2019-03-24 DIAGNOSIS — S0990XA Unspecified injury of head, initial encounter: Secondary | ICD-10-CM | POA: Diagnosis not present

## 2019-03-24 DIAGNOSIS — Z20822 Contact with and (suspected) exposure to covid-19: Secondary | ICD-10-CM | POA: Diagnosis not present

## 2019-03-24 DIAGNOSIS — S3991XA Unspecified injury of abdomen, initial encounter: Secondary | ICD-10-CM | POA: Insufficient documentation

## 2019-03-24 DIAGNOSIS — Y929 Unspecified place or not applicable: Secondary | ICD-10-CM | POA: Diagnosis not present

## 2019-03-24 DIAGNOSIS — Y939 Activity, unspecified: Secondary | ICD-10-CM | POA: Diagnosis not present

## 2019-03-24 DIAGNOSIS — I10 Essential (primary) hypertension: Secondary | ICD-10-CM | POA: Diagnosis not present

## 2019-03-24 DIAGNOSIS — R0789 Other chest pain: Secondary | ICD-10-CM | POA: Diagnosis not present

## 2019-03-24 DIAGNOSIS — Y999 Unspecified external cause status: Secondary | ICD-10-CM | POA: Insufficient documentation

## 2019-03-24 DIAGNOSIS — M9951 Intervertebral disc stenosis of neural canal of cervical region: Secondary | ICD-10-CM | POA: Diagnosis not present

## 2019-03-24 DIAGNOSIS — R52 Pain, unspecified: Secondary | ICD-10-CM | POA: Diagnosis not present

## 2019-03-24 DIAGNOSIS — S199XXA Unspecified injury of neck, initial encounter: Secondary | ICD-10-CM | POA: Diagnosis not present

## 2019-03-24 DIAGNOSIS — Z79899 Other long term (current) drug therapy: Secondary | ICD-10-CM | POA: Diagnosis not present

## 2019-03-24 DIAGNOSIS — M542 Cervicalgia: Secondary | ICD-10-CM | POA: Diagnosis not present

## 2019-03-24 DIAGNOSIS — M5489 Other dorsalgia: Secondary | ICD-10-CM | POA: Diagnosis not present

## 2019-03-24 LAB — TYPE AND SCREEN
ABO/RH(D): A POS
Antibody Screen: NEGATIVE

## 2019-03-24 LAB — CBC WITH DIFFERENTIAL/PLATELET
Abs Immature Granulocytes: 0.01 10*3/uL (ref 0.00–0.07)
Basophils Absolute: 0 10*3/uL (ref 0.0–0.1)
Basophils Relative: 1 %
Eosinophils Absolute: 0.3 10*3/uL (ref 0.0–0.5)
Eosinophils Relative: 4 %
HCT: 39.6 % (ref 36.0–46.0)
Hemoglobin: 12.9 g/dL (ref 12.0–15.0)
Immature Granulocytes: 0 %
Lymphocytes Relative: 34 %
Lymphs Abs: 2.7 10*3/uL (ref 0.7–4.0)
MCH: 30.1 pg (ref 26.0–34.0)
MCHC: 32.6 g/dL (ref 30.0–36.0)
MCV: 92.5 fL (ref 80.0–100.0)
Monocytes Absolute: 0.7 10*3/uL (ref 0.1–1.0)
Monocytes Relative: 9 %
Neutro Abs: 4.1 10*3/uL (ref 1.7–7.7)
Neutrophils Relative %: 52 %
Platelets: 303 10*3/uL (ref 150–400)
RBC: 4.28 MIL/uL (ref 3.87–5.11)
RDW: 12.1 % (ref 11.5–15.5)
WBC: 7.9 10*3/uL (ref 4.0–10.5)
nRBC: 0 % (ref 0.0–0.2)

## 2019-03-24 LAB — BASIC METABOLIC PANEL
Anion gap: 10 (ref 5–15)
BUN: 21 mg/dL (ref 8–23)
CO2: 22 mmol/L (ref 22–32)
Calcium: 9.8 mg/dL (ref 8.9–10.3)
Chloride: 106 mmol/L (ref 98–111)
Creatinine, Ser: 0.88 mg/dL (ref 0.44–1.00)
GFR calc Af Amer: >60 mL/min (ref >60)
GFR calc non Af Amer: >60 mL/min (ref >60)
Glucose, Bld: 92 mg/dL (ref 70–99)
Potassium: 3.9 mmol/L (ref 3.5–5.1)
Sodium: 138 mmol/L (ref 135–145)

## 2019-03-24 LAB — I-STAT CHEM 8, ED
BUN: 25 mg/dL — ABNORMAL HIGH (ref 8–23)
Calcium, Ion: 1.28 mmol/L (ref 1.15–1.40)
Chloride: 104 mmol/L (ref 98–111)
Creatinine, Ser: 0.9 mg/dL (ref 0.44–1.00)
Glucose, Bld: 94 mg/dL (ref 70–99)
HCT: 40 % (ref 36.0–46.0)
Hemoglobin: 13.6 g/dL (ref 12.0–15.0)
Potassium: 3.8 mmol/L (ref 3.5–5.1)
Sodium: 139 mmol/L (ref 135–145)
TCO2: 27 mmol/L (ref 22–32)

## 2019-03-24 LAB — RESPIRATORY PANEL BY RT PCR (FLU A&B, COVID)
Influenza A by PCR: NEGATIVE
Influenza B by PCR: NEGATIVE
SARS Coronavirus 2 by RT PCR: NEGATIVE

## 2019-03-24 LAB — PROTIME-INR
INR: 0.9 (ref 0.8–1.2)
Prothrombin Time: 12.5 seconds (ref 11.4–15.2)

## 2019-03-24 LAB — ABO/RH: ABO/RH(D): A POS

## 2019-03-24 MED ORDER — HYDROCODONE-IBUPROFEN 7.5-200 MG PO TABS: 1.0000 | ORAL_TABLET | Freq: Three times a day (TID) | ORAL | 0 refills | Status: DC | PRN

## 2019-03-24 MED ORDER — IBUPROFEN 400 MG PO TABS
600.0000 mg | ORAL_TABLET | Freq: Once | ORAL | Status: AC
  Administered 2019-03-24: 600 mg via ORAL
  Filled 2019-03-24: qty 1

## 2019-03-24 MED ORDER — IOHEXOL 300 MG/ML  SOLN
100.0000 mL | Freq: Once | INTRAMUSCULAR | Status: AC | PRN
  Administered 2019-03-24: 100 mL via INTRAVENOUS

## 2019-03-24 NOTE — ED Notes (Signed)
This RN called CT Scan; Transport on the way.
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
## 2019-03-24 NOTE — ED Notes (Signed)
Pt transported to Radiology 

## 2019-03-25 ENCOUNTER — Encounter: Payer: BC Managed Care – PPO | Admitting: Physician Assistant

## 2019-03-25 DIAGNOSIS — Z0001 Encounter for general adult medical examination with abnormal findings: Secondary | ICD-10-CM

## 2019-03-25 DIAGNOSIS — M5412 Radiculopathy, cervical region: Secondary | ICD-10-CM

## 2019-03-25 MED ORDER — CYCLOBENZAPRINE HCL 5 MG PO TABS: 5.0000 mg | ORAL_TABLET | Freq: Three times a day (TID) | ORAL | 1 refills | Status: DC | PRN

## 2019-03-25 MED ORDER — HYDROCODONE-ACETAMINOPHEN 5-325 MG PO TABS: 1.0000 | ORAL_TABLET | Freq: Four times a day (QID) | ORAL | 0 refills | Status: DC | PRN

## 2019-03-25 NOTE — ED Notes (Signed)
Patient verbalizes understanding of discharge instructions. Opportunity for questioning and answers were provided. Armband removed by staff, pt discharged from ED ambulatory to home.  

## 2019-03-25 NOTE — Discharge Instructions (Signed)
Please return for any problem.  Follow-up with your regular care provider as instructed. °

## 2019-03-29 ENCOUNTER — Other Ambulatory Visit: Payer: Self-pay

## 2019-03-29 ENCOUNTER — Ambulatory Visit (INDEPENDENT_AMBULATORY_CARE_PROVIDER_SITE_OTHER): Payer: BC Managed Care – PPO | Admitting: Sports Medicine

## 2019-03-29 VITALS — BP 164/98 | Ht 66.0 in | Wt 185.0 lb

## 2019-03-29 DIAGNOSIS — S161XXD Strain of muscle, fascia and tendon at neck level, subsequent encounter: Secondary | ICD-10-CM

## 2019-03-29 DIAGNOSIS — S39012D Strain of muscle, fascia and tendon of lower back, subsequent encounter: Secondary | ICD-10-CM | POA: Diagnosis not present

## 2019-03-30 ENCOUNTER — Encounter: Payer: Self-pay | Admitting: Sports Medicine
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
## 2019-04-04 ENCOUNTER — Other Ambulatory Visit: Payer: Self-pay

## 2019-04-04 ENCOUNTER — Encounter: Payer: Self-pay | Admitting: Physical Therapy

## 2019-04-04 ENCOUNTER — Ambulatory Visit: Payer: BC Managed Care – PPO | Attending: Sports Medicine | Admitting: Physical Therapy

## 2019-04-04 DIAGNOSIS — M545 Low back pain, unspecified: Secondary | ICD-10-CM

## 2019-04-04 DIAGNOSIS — M542 Cervicalgia: Secondary | ICD-10-CM | POA: Diagnosis not present

## 2019-04-05 ENCOUNTER — Ambulatory Visit: Payer: BC Managed Care – PPO | Admitting: Physical Therapy

## 2019-04-05 ENCOUNTER — Encounter: Payer: Self-pay | Admitting: Physical Therapy

## 2019-04-05 DIAGNOSIS — M542 Cervicalgia: Secondary | ICD-10-CM | POA: Diagnosis not present

## 2019-04-05 DIAGNOSIS — M545 Low back pain, unspecified: Secondary | ICD-10-CM
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
## 2019-04-06 ENCOUNTER — Ambulatory Visit: Payer: BC Managed Care – PPO | Admitting: Physical Therapy

## 2019-04-06 ENCOUNTER — Other Ambulatory Visit: Payer: Self-pay

## 2019-04-06 ENCOUNTER — Encounter: Payer: Self-pay | Admitting: Physical Therapy

## 2019-04-06 DIAGNOSIS — M545 Low back pain, unspecified: Secondary | ICD-10-CM

## 2019-04-06 DIAGNOSIS — M542 Cervicalgia: Secondary | ICD-10-CM | POA: Diagnosis not present

## 2019-04-06 NOTE — Patient Instructions (Signed)
   PELVIC TILT  Lie on back, legs bent. Exhale, tilting top of pelvis back, pubic bone up, to flatten lower back. Inhale, rolling pelvis opposite way, top forward, pubic bone down, arch in back. Repeat __10__ times. Do __2__ sessions per day. Copyright  VHI. All rights reserved.    Isometric Hold With Pelvic Floor (Hook-Lying)  Lie with hips and knees bent. Slowly inhale, and then exhale. Pull navel toward spine and tighten pelvic floor. Hold for __10_ seconds. Continue to breathe in and out during hold. Rest for _10__ seconds. Repeat __10_ times. Do __2-3_ times a day.   Knee Fold  Lie on back, legs bent, arms by sides. Exhale, lifting knee to chest. Inhale, returning. Keep abdominals flat, navel to spine. Repeat __10__ times, alternating legs. Do __2__ sessions per day.  Knee Drop  Keep pelvis stable. Without rotating hips, slowly drop knee to side, pause, return to center, bring knee across midline toward opposite hip. Feel obliques engaging. Repeat for ___10_ times each leg.   Copyright  VHI. All rights reserved.   Resisted Horizontal Abduction: Bilateral    Sit or stand, tubing in both hands, arms out in front. Keeping arms straight, pinch shoulder blades together and stretch arms out. Repeat __10__ times per set. Do __2__ sets per session. Do ___1-2_ sessions per day.  http://orth.exer.us/969   Copyright  VHI. All rights reserved.
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
## 2019-04-11 ENCOUNTER — Ambulatory Visit: Payer: BC Managed Care – PPO | Admitting: Physical Therapy

## 2019-04-12 ENCOUNTER — Ambulatory Visit: Payer: BC Managed Care – PPO | Admitting: Sports Medicine

## 2019-04-14 ENCOUNTER — Ambulatory Visit: Payer: BC Managed Care – PPO | Admitting: Sports Medicine

## 2019-04-18 ENCOUNTER — Ambulatory Visit: Payer: BC Managed Care – PPO | Attending: Sports Medicine | Admitting: Physical Therapy

## 2019-04-18 ENCOUNTER — Other Ambulatory Visit: Payer: Self-pay

## 2019-04-18 DIAGNOSIS — M542 Cervicalgia: Secondary | ICD-10-CM | POA: Diagnosis not present

## 2019-04-18 DIAGNOSIS — M545 Low back pain, unspecified: Secondary | ICD-10-CM
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
## 2019-04-19 ENCOUNTER — Ambulatory Visit (INDEPENDENT_AMBULATORY_CARE_PROVIDER_SITE_OTHER): Payer: BC Managed Care – PPO | Admitting: Sports Medicine

## 2019-04-19 ENCOUNTER — Ambulatory Visit: Payer: Self-pay | Admitting: Sports Medicine

## 2019-04-19 VITALS — BP 156/90 | Ht 66.0 in | Wt 186.0 lb

## 2019-04-19 DIAGNOSIS — S39012D Strain of muscle, fascia and tendon of lower back, subsequent encounter: Secondary | ICD-10-CM

## 2019-04-20 ENCOUNTER — Ambulatory Visit: Payer: BC Managed Care – PPO | Admitting: Physical Therapy
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
## 2019-04-22 ENCOUNTER — Ambulatory Visit: Payer: BC Managed Care – PPO | Admitting: Physical Therapy

## 2019-04-25 ENCOUNTER — Other Ambulatory Visit: Payer: Self-pay

## 2019-04-25 ENCOUNTER — Ambulatory Visit: Payer: BC Managed Care – PPO | Admitting: Physical Therapy

## 2019-04-26 ENCOUNTER — Other Ambulatory Visit: Payer: Self-pay | Admitting: Sports Medicine

## 2019-04-26 MED ORDER — HYDROCODONE-IBUPROFEN 7.5-200 MG PO TABS: 1.0000 | ORAL_TABLET | Freq: Three times a day (TID) | ORAL | 0 refills | Status: DC | PRN

## 2019-04-27 ENCOUNTER — Encounter: Payer: BC Managed Care – PPO | Admitting: Physical Therapy

## 2019-04-29 ENCOUNTER — Encounter: Payer: BC Managed Care – PPO | Admitting: Physical Therapy

## 2019-05-04 ENCOUNTER — Ambulatory Visit: Payer: BC Managed Care – PPO | Admitting: Physical Therapy

## 2019-05-04 ENCOUNTER — Encounter: Payer: Self-pay | Admitting: Physical Therapy

## 2019-05-04 ENCOUNTER — Other Ambulatory Visit: Payer: Self-pay

## 2019-05-04 DIAGNOSIS — M545 Low back pain, unspecified: Secondary | ICD-10-CM

## 2019-05-04 DIAGNOSIS — M542 Cervicalgia: Secondary | ICD-10-CM
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
## 2019-05-10 ENCOUNTER — Other Ambulatory Visit: Payer: Self-pay

## 2019-05-10 ENCOUNTER — Ambulatory Visit: Payer: BC Managed Care – PPO | Admitting: Physical Therapy

## 2019-05-10 DIAGNOSIS — M545 Low back pain, unspecified: Secondary | ICD-10-CM

## 2019-05-10 DIAGNOSIS — M542 Cervicalgia: Secondary | ICD-10-CM | POA: Diagnosis not present
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
## 2019-05-19 ENCOUNTER — Ambulatory Visit: Payer: BC Managed Care – PPO | Attending: Sports Medicine

## 2019-05-19 ENCOUNTER — Other Ambulatory Visit: Payer: Self-pay

## 2019-05-19 DIAGNOSIS — M542 Cervicalgia: Secondary | ICD-10-CM | POA: Insufficient documentation

## 2019-05-19 DIAGNOSIS — M545 Low back pain, unspecified: Secondary | ICD-10-CM
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
## 2019-05-23 ENCOUNTER — Other Ambulatory Visit: Payer: Self-pay

## 2019-05-24 ENCOUNTER — Ambulatory Visit (INDEPENDENT_AMBULATORY_CARE_PROVIDER_SITE_OTHER): Payer: BC Managed Care – PPO | Admitting: Sports Medicine

## 2019-05-24 ENCOUNTER — Ambulatory Visit
Admission: RE | Admit: 2019-05-24 | Discharge: 2019-05-24 | Disposition: A | Discharge status: Home / Self Care | Payer: BC Managed Care – PPO | Source: Ambulatory Visit | Attending: Sports Medicine | Admitting: Sports Medicine

## 2019-05-24 ENCOUNTER — Other Ambulatory Visit: Payer: Self-pay

## 2019-05-24 VITALS — BP 128/80 | Ht 66.0 in | Wt 185.0 lb

## 2019-05-24 DIAGNOSIS — M545 Low back pain, unspecified: Secondary | ICD-10-CM

## 2019-05-24 DIAGNOSIS — M5442 Lumbago with sciatica, left side: Secondary | ICD-10-CM

## 2019-05-24 MED ORDER — HYDROCODONE-IBUPROFEN 7.5-200 MG PO TABS: 1.0000 | ORAL_TABLET | Freq: Three times a day (TID) | ORAL | 0 refills | Status: DC | PRN

## 2019-05-24 MED ORDER — PREDNISONE 10 MG (21) PO TBPK: ORAL_TABLET | ORAL | 0 refills | Status: DC
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
## 2019-05-31 ENCOUNTER — Other Ambulatory Visit: Payer: Self-pay

## 2019-05-31 ENCOUNTER — Ambulatory Visit: Payer: BC Managed Care – PPO | Admitting: Physical Therapy

## 2019-05-31 ENCOUNTER — Encounter: Payer: Self-pay | Admitting: Physical Therapy

## 2019-05-31 DIAGNOSIS — M545 Low back pain, unspecified: Secondary | ICD-10-CM

## 2019-05-31 DIAGNOSIS — M542 Cervicalgia: Secondary | ICD-10-CM | POA: Diagnosis not present

## 2019-05-31 NOTE — Patient Instructions (Addendum)
Trigger Point Dry Needling  . What is Trigger Point Dry Needling (DN)? o DN is a physical therapy technique used to treat muscle pain and dysfunction. Specifically, DN helps deactivate muscle trigger points (muscle knots).  o A thin filiform needle is used to penetrate the skin and stimulate the underlying trigger point. The goal is for a local twitch response (LTR) to occur and for the trigger point to relax. No medication of any kind is injected during the procedure.   . What Does Trigger Point Dry Needling Feel Like?  o The procedure feels different for each individual patient. Some patients report that they do not actually feel the needle enter the skin and overall the process is not painful. Very mild bleeding may occur. However, many patients feel a deep cramping in the muscle in which the needle was inserted. This is the local twitch response.   Marland Kitchen How Will I feel after the treatment? o Soreness is normal, and the onset of soreness may not occur for a few hours. Typically this soreness does not last longer than two days.  o Bruising is uncommon, however; ice can be used to decrease any possible bruising.  o In rare cases feeling tired or nauseous after the treatment is normal. In addition, your symptoms may get worse before they get better, this period will typically not last longer than 24 hours.   . What Can I do After My Treatment? o Increase your hydration by drinking more water for the next 24 hours. o You may place ice or heat on the areas treated that have become sore, however, do not use heat on inflamed or bruised areas. Heat often brings more relief post needling. o You can continue your regular activities, but vigorous activity is not recommended initially after the treatment for 24 hours. o DN is best combined with other physical therapy such as strengthening, stretching, and other therapies.   Voncille Lo, PT Certified Exercise Expert for the Aging Adult  05/31/19 10:27  AM Phone: [phone] Fax: [phone]
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
## 2019-06-01 ENCOUNTER — Encounter: Payer: Self-pay | Admitting: *Deleted

## 2019-06-02 ENCOUNTER — Ambulatory Visit: Payer: BC Managed Care – PPO | Admitting: Physical Therapy

## 2019-06-02 ENCOUNTER — Encounter: Payer: Self-pay | Admitting: Physical Therapy

## 2019-06-02 ENCOUNTER — Other Ambulatory Visit: Payer: Self-pay

## 2019-06-02 DIAGNOSIS — M545 Low back pain, unspecified: Secondary | ICD-10-CM

## 2019-06-02 DIAGNOSIS — M542 Cervicalgia: Secondary | ICD-10-CM
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
## 2019-06-06 ENCOUNTER — Other Ambulatory Visit: Payer: Self-pay

## 2019-06-06 ENCOUNTER — Encounter: Payer: Self-pay | Admitting: Physical Therapy

## 2019-06-06 ENCOUNTER — Ambulatory Visit: Payer: BC Managed Care – PPO | Admitting: Physical Therapy

## 2019-06-06 DIAGNOSIS — M545 Low back pain, unspecified: Secondary | ICD-10-CM

## 2019-06-06 DIAGNOSIS — M542 Cervicalgia: Secondary | ICD-10-CM | POA: Diagnosis not present
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
## 2019-06-08 ENCOUNTER — Other Ambulatory Visit: Payer: Self-pay

## 2019-06-08 ENCOUNTER — Ambulatory Visit: Payer: BC Managed Care – PPO | Admitting: Physical Therapy

## 2019-06-08 ENCOUNTER — Encounter: Payer: Self-pay | Admitting: Physical Therapy

## 2019-06-08 DIAGNOSIS — M545 Low back pain, unspecified: Secondary | ICD-10-CM

## 2019-06-08 DIAGNOSIS — M542 Cervicalgia: Secondary | ICD-10-CM
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
## 2019-06-16 ENCOUNTER — Ambulatory Visit: Payer: BC Managed Care – PPO

## 2019-06-18 ENCOUNTER — Other Ambulatory Visit: Payer: Self-pay

## 2019-06-18 DIAGNOSIS — M5442 Lumbago with sciatica, left side: Secondary | ICD-10-CM

## 2019-06-20 ENCOUNTER — Other Ambulatory Visit: Payer: Self-pay

## 2019-06-20 ENCOUNTER — Ambulatory Visit
Admission: RE | Admit: 2019-06-20 | Discharge: 2019-06-20 | Disposition: A | Discharge status: Home / Self Care | Payer: BC Managed Care – PPO | Source: Ambulatory Visit | Attending: Sports Medicine | Admitting: Sports Medicine

## 2019-06-20 DIAGNOSIS — M5442 Lumbago with sciatica, left side: Secondary | ICD-10-CM

## 2019-06-20 DIAGNOSIS — M48061 Spinal stenosis, lumbar region without neurogenic claudication: Secondary | ICD-10-CM | POA: Diagnosis not present

## 2019-06-21 ENCOUNTER — Encounter: Payer: Self-pay | Admitting: Physical Therapy

## 2019-06-21 ENCOUNTER — Ambulatory Visit: Payer: BC Managed Care – PPO | Attending: Sports Medicine | Admitting: Physical Therapy

## 2019-06-21 ENCOUNTER — Other Ambulatory Visit: Payer: Self-pay | Admitting: Sports Medicine

## 2019-06-21 ENCOUNTER — Other Ambulatory Visit: Payer: Self-pay

## 2019-06-21 DIAGNOSIS — M545 Low back pain, unspecified: Secondary | ICD-10-CM

## 2019-06-21 DIAGNOSIS — M542 Cervicalgia: Secondary | ICD-10-CM | POA: Insufficient documentation

## 2019-06-21 DIAGNOSIS — M5442 Lumbago with sciatica, left side: Secondary | ICD-10-CM

## 2019-06-21 MED ORDER — HYDROCODONE-IBUPROFEN 7.5-200 MG PO TABS: 1.0000 | ORAL_TABLET | Freq: Three times a day (TID) | ORAL | 0 refills | Status: DC | PRN
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
## 2019-06-23 ENCOUNTER — Ambulatory Visit: Payer: BC Managed Care – PPO

## 2019-06-23 ENCOUNTER — Other Ambulatory Visit: Payer: Self-pay

## 2019-06-23 DIAGNOSIS — M542 Cervicalgia: Secondary | ICD-10-CM

## 2019-06-23 DIAGNOSIS — M545 Low back pain, unspecified: Secondary | ICD-10-CM
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
## 2019-06-27 ENCOUNTER — Other Ambulatory Visit: Payer: Self-pay

## 2019-06-27 ENCOUNTER — Encounter: Payer: Self-pay | Admitting: Physical Therapy

## 2019-06-27 ENCOUNTER — Ambulatory Visit: Payer: BC Managed Care – PPO | Admitting: Physical Therapy

## 2019-06-27 DIAGNOSIS — M542 Cervicalgia: Secondary | ICD-10-CM | POA: Diagnosis not present

## 2019-06-27 DIAGNOSIS — M545 Low back pain, unspecified: Secondary | ICD-10-CM
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
## 2019-06-28 ENCOUNTER — Ambulatory Visit
Admission: RE | Admit: 2019-06-28 | Discharge: 2019-06-28 | Disposition: A | Discharge status: Home / Self Care | Payer: BC Managed Care – PPO | Source: Ambulatory Visit | Attending: Sports Medicine | Admitting: Sports Medicine

## 2019-06-28 ENCOUNTER — Ambulatory Visit: Payer: BC Managed Care – PPO | Admitting: Sports Medicine

## 2019-06-28 VITALS — BP 156/90 | Ht 66.0 in | Wt 186.0 lb

## 2019-06-28 DIAGNOSIS — M5442 Lumbago with sciatica, left side: Secondary | ICD-10-CM | POA: Diagnosis not present

## 2019-06-28 DIAGNOSIS — M25562 Pain in left knee: Secondary | ICD-10-CM | POA: Diagnosis not present

## 2019-06-28 DIAGNOSIS — S8992XA Unspecified injury of left lower leg, initial encounter: Secondary | ICD-10-CM | POA: Diagnosis not present

## 2019-06-29 NOTE — Progress Notes (Signed)
   Subjective:    Patient ID: Gabriela [name], female    DOB: [birth year], 64 y.o.   MRN: [id number]  HPI  Patient presents today for a couple of different reasons.  First, she is complaining of left knee pain that began 10 days ago after a fall directly onto the left patella.  She tripped going over a curb landing on her left knee.  She was able to get up and walk afterwards but has had increasing pain with activity, especially prolonged standing or walking.  Pain is diffuse around the knee.  She has not noticed any swelling but does endorse some feelings of instability.  She suffered an abrasion to the anterior aspect of the knee which she has been treating with which hazel and Neosporin.  She is also here to follow-up on her recent MRI of her lumbar spine.  MRI shows multilevel degenerative changes as well as increased mild to moderate bilateral foraminal stenosis at L5-S1.  She also has a new mild grade 1 anterior listhesis at L4-L5.  She is responding well to physical therapy.    Review of Systems As above    Objective:   Physical Exam  Well-developed, well-nourished.  No acute distress.  Awake alert and oriented x3.  Left knee: Good range of motion.  No obvious effusion.  There is a well-healing abrasion on the anterior knee with no signs of infection.  There is some tenderness to palpation along medial and lateral joint lines.  Knee is stable to valgus and varus stressing.  Negative anterior drawer, negative posterior drawer.  Negative McMurray's.  Neurovascularly intact distally.  X-rays of the left knee including AP, lateral, and sunrise views are unremarkable.  No significant degenerative changes.  No effusion.  Nothing acute is seen.      Assessment & Plan:   Left knee pain secondary to contusion Improving left leg lumbar radiculopathy  Reassurance regarding her x-ray findings.  I've given her a cane to help assist with ambulation.  If symptoms persist or worsen then  consider merits of MRI to rule out internal derangement. In regards to her left leg lumbar radiculopathy, she is responding well to physical therapy.  She will continue with formal therapy until discharge at the therapist's discretion.  I did discuss the merits of an epidural steroid injection if her symptoms return.  Follow-up as needed.

## 2019-07-01 ENCOUNTER — Ambulatory Visit: Payer: BC Managed Care – PPO | Admitting: Physical Therapy

## 2019-07-01 ENCOUNTER — Other Ambulatory Visit: Payer: Self-pay

## 2019-07-01 ENCOUNTER — Encounter: Payer: Self-pay | Admitting: Physical Therapy

## 2019-07-01 DIAGNOSIS — M542 Cervicalgia: Secondary | ICD-10-CM

## 2019-07-01 DIAGNOSIS — M545 Low back pain, unspecified: Secondary | ICD-10-CM
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
## 2019-07-06 ENCOUNTER — Ambulatory Visit: Payer: BC Managed Care – PPO | Admitting: Physical Therapy

## 2019-07-08 ENCOUNTER — Encounter: Payer: Self-pay | Admitting: Physical Therapy

## 2019-07-08 ENCOUNTER — Other Ambulatory Visit: Payer: Self-pay

## 2019-07-08 ENCOUNTER — Ambulatory Visit: Payer: BC Managed Care – PPO | Admitting: Physical Therapy

## 2019-07-08 DIAGNOSIS — M542 Cervicalgia: Secondary | ICD-10-CM | POA: Diagnosis not present

## 2019-07-08 DIAGNOSIS — M545 Low back pain, unspecified: Secondary | ICD-10-CM
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
## 2019-07-13 ENCOUNTER — Other Ambulatory Visit: Payer: Self-pay

## 2019-07-13 DIAGNOSIS — M5442 Lumbago with sciatica, left side: Secondary | ICD-10-CM

## 2019-07-14 ENCOUNTER — Other Ambulatory Visit: Payer: Self-pay | Admitting: Sports Medicine

## 2019-07-14 DIAGNOSIS — M5442 Lumbago with sciatica, left side: Secondary | ICD-10-CM

## 2019-07-14 MED ORDER — HYDROCODONE-IBUPROFEN 7.5-200 MG PO TABS: 1.0000 | ORAL_TABLET | Freq: Three times a day (TID) | ORAL | 0 refills | Status: DC | PRN

## 2019-07-19 DIAGNOSIS — S62309A Unspecified fracture of unspecified metacarpal bone, initial encounter for closed fracture: Secondary | ICD-10-CM | POA: Diagnosis not present

## 2019-07-19 DIAGNOSIS — M25531 Pain in right wrist: Secondary | ICD-10-CM | POA: Diagnosis not present

## 2019-07-19 DIAGNOSIS — S62619A Displaced fracture of proximal phalanx of unspecified finger, initial encounter for closed fracture: Secondary | ICD-10-CM | POA: Diagnosis not present

## 2019-07-19 DIAGNOSIS — M79641 Pain in right hand: Secondary | ICD-10-CM | POA: Diagnosis not present

## 2019-07-20 DIAGNOSIS — M79641 Pain in right hand: Secondary | ICD-10-CM | POA: Diagnosis not present

## 2019-08-01 DIAGNOSIS — M79641 Pain in right hand: Secondary | ICD-10-CM | POA: Diagnosis not present

## 2019-08-09 ENCOUNTER — Encounter: Payer: Self-pay | Admitting: Physical Therapy

## 2019-08-11 ENCOUNTER — Other Ambulatory Visit: Payer: Self-pay | Admitting: Sports Medicine

## 2019-08-11 ENCOUNTER — Other Ambulatory Visit: Payer: Self-pay

## 2019-08-11 DIAGNOSIS — M5442 Lumbago with sciatica, left side: Secondary | ICD-10-CM

## 2019-08-11 MED ORDER — HYDROCODONE-IBUPROFEN 7.5-200 MG PO TABS: 1.0000 | ORAL_TABLET | Freq: Three times a day (TID) | ORAL | 0 refills | Status: DC | PRN

## 2019-08-18 DIAGNOSIS — M79641 Pain in right hand: Secondary | ICD-10-CM | POA: Diagnosis not present

## 2019-08-25 DIAGNOSIS — M79641 Pain in right hand: Secondary | ICD-10-CM | POA: Diagnosis not present

## 2019-08-26 ENCOUNTER — Other Ambulatory Visit: Payer: Self-pay

## 2019-08-26 ENCOUNTER — Encounter: Payer: Self-pay | Admitting: Physical Therapy

## 2019-08-26 ENCOUNTER — Ambulatory Visit: Payer: BC Managed Care – PPO | Attending: Sports Medicine | Admitting: Physical Therapy

## 2019-08-26 DIAGNOSIS — M545 Low back pain, unspecified: Secondary | ICD-10-CM

## 2019-08-26 DIAGNOSIS — M542 Cervicalgia: Secondary | ICD-10-CM
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
## 2019-09-01 DIAGNOSIS — M79641 Pain in right hand: Secondary | ICD-10-CM | POA: Diagnosis not present

## 2019-09-07 ENCOUNTER — Other Ambulatory Visit: Payer: Self-pay

## 2019-09-07 DIAGNOSIS — M79641 Pain in right hand: Secondary | ICD-10-CM | POA: Diagnosis not present

## 2019-09-07 DIAGNOSIS — M5442 Lumbago with sciatica, left side: Secondary | ICD-10-CM

## 2019-09-09 ENCOUNTER — Other Ambulatory Visit: Payer: Self-pay | Admitting: Sports Medicine

## 2019-09-09 DIAGNOSIS — M5442 Lumbago with sciatica, left side: Secondary | ICD-10-CM

## 2019-09-09 MED ORDER — HYDROCODONE-IBUPROFEN 7.5-200 MG PO TABS: 1.0000 | ORAL_TABLET | Freq: Three times a day (TID) | ORAL | 0 refills | Status: DC | PRN

## 2019-09-14 DIAGNOSIS — M79641 Pain in right hand: Secondary | ICD-10-CM | POA: Diagnosis not present

## 2019-09-19 DIAGNOSIS — M79641 Pain in right hand: Secondary | ICD-10-CM | POA: Diagnosis not present

## 2019-10-03 DIAGNOSIS — M79641 Pain in right hand: Secondary | ICD-10-CM | POA: Diagnosis not present

## 2019-10-06 ENCOUNTER — Other Ambulatory Visit: Payer: Self-pay

## 2019-10-06 ENCOUNTER — Other Ambulatory Visit: Payer: Self-pay | Admitting: Sports Medicine

## 2019-10-06 DIAGNOSIS — M5442 Lumbago with sciatica, left side: Secondary | ICD-10-CM

## 2019-10-06 MED ORDER — HYDROCODONE-IBUPROFEN 7.5-200 MG PO TABS: 1.0000 | ORAL_TABLET | Freq: Three times a day (TID) | ORAL | 0 refills | Status: DC | PRN

## 2019-10-10 DIAGNOSIS — M79641 Pain in right hand: Secondary | ICD-10-CM | POA: Diagnosis not present

## 2019-10-19 DIAGNOSIS — M79641 Pain in right hand: Secondary | ICD-10-CM | POA: Diagnosis not present

## 2019-10-25 DIAGNOSIS — M79641 Pain in right hand: Secondary | ICD-10-CM | POA: Diagnosis not present

## 2019-11-01 DIAGNOSIS — M79641 Pain in right hand: Secondary | ICD-10-CM | POA: Diagnosis not present

## 2019-11-04 ENCOUNTER — Other Ambulatory Visit: Payer: Self-pay

## 2019-11-04 DIAGNOSIS — M5442 Lumbago with sciatica, left side: Secondary | ICD-10-CM

## 2019-11-07 ENCOUNTER — Other Ambulatory Visit: Payer: Self-pay | Admitting: Sports Medicine

## 2019-11-07 DIAGNOSIS — M5442 Lumbago with sciatica, left side: Secondary | ICD-10-CM

## 2019-11-07 MED ORDER — HYDROCODONE-IBUPROFEN 7.5-200 MG PO TABS: 1.0000 | ORAL_TABLET | Freq: Three times a day (TID) | ORAL | 0 refills | Status: DC | PRN

## 2019-11-23 ENCOUNTER — Encounter: Payer: BC Managed Care – PPO | Admitting: Physician Assistant

## 2019-12-05 ENCOUNTER — Other Ambulatory Visit: Payer: Self-pay

## 2019-12-05 DIAGNOSIS — M5442 Lumbago with sciatica, left side: Secondary | ICD-10-CM

## 2019-12-06 ENCOUNTER — Other Ambulatory Visit: Payer: Self-pay | Admitting: Sports Medicine

## 2019-12-06 DIAGNOSIS — M5442 Lumbago with sciatica, left side: Secondary | ICD-10-CM

## 2019-12-06 MED ORDER — HYDROCODONE-IBUPROFEN 7.5-200 MG PO TABS: 1.0000 | ORAL_TABLET | Freq: Three times a day (TID) | ORAL | 0 refills | Status: DC | PRN

## 2019-12-19 ENCOUNTER — Other Ambulatory Visit: Payer: Self-pay

## 2019-12-19 MED ORDER — DIAZEPAM 5 MG PO TABS: ORAL_TABLET | ORAL | 0 refills | Status: DC

## 2019-12-20 ENCOUNTER — Other Ambulatory Visit: Payer: Self-pay | Admitting: Adult Health

## 2019-12-21 ENCOUNTER — Encounter: Payer: Self-pay | Admitting: Family Medicine

## 2019-12-21 ENCOUNTER — Ambulatory Visit: Payer: BC Managed Care – PPO | Admitting: Family Medicine

## 2019-12-21 ENCOUNTER — Other Ambulatory Visit: Payer: Self-pay

## 2019-12-21 DIAGNOSIS — S46119A Strain of muscle, fascia and tendon of long head of biceps, unspecified arm, initial encounter: Secondary | ICD-10-CM

## 2019-12-21 NOTE — Patient Instructions (Signed)
You have a tear of your biceps at the musculotendinous junction. Ice the area 15 minutes at a time 3-4 times a day at least. Tylenol first line for pain if needed - ok to take ibuprofen if needed beyond this. Do motion exercises once a day to keep the elbow from getting stiff. The risk of reinjury is elevated the first 6 weeks but especially these first 2 weeks. Try to minimize lifting as much as possible - no lifting more than 1 pound the next 2 weeks. Follow up with me in 2 weeks for reevaluation.

## 2019-12-21 NOTE — Assessment & Plan Note (Signed)
Assessment:  64 y.o. female with soreness and bruising in the region of the left bicep after an injury which occurred during 2 instances over the past 10 days.  10 days ago she had some significant pain to her left bicep when a coworker dropped a box that she was assisting them carry.  She seemed to do well after this but then got in exquisite pain a few days prior to this encounter when she was simply stuffing plastic bags and other plastic bags.  On physical exam the patient does have significant ecchymosis in the region of the bulk of the biceps musculature, she does have pain in the body of the biceps, she also has mild discomfort with Yergason's testing.  Patient does have 5 of 5 strength in bicep and tricep strength testing.  Limited ultrasound performed does show evidence of a partial tear of the proximal biceps musculature in the region of muscular to cutaneous junction of along with some surrounding hematoma.  Patient likely injured her bicep initially 10 days ago and then caused further damage when she was stuffing plastic bags into other plastic bags on Friday of this past week. Plan: -Discussed this with patient along with the results of her ultrasound.  Patient will minimize lifting, pushing, and pulling instances to no more than 1 pound.  She will continue to use ice.  She will also work with some light mobility exercises to make sure that she does not limit her range of motion. -She will continue with acetaminophen for pain control with some ibuprofen as needed for breakthrough pain. -Patient to follow-up in 2 weeks.

## 2019-12-21 NOTE — Progress Notes (Addendum)
SUBJECTIVE:   CHIEF COMPLAINT / HPI:   Left bicep pain: Patient is a 64 year old female that presents with left bicep pain that started initially 10 days ago when she was carrying a heavy box with a coworker who suddenly dropped the box putting strain on her bicep.  Patient states that she had some initial pain from this but tolerated it pretty well overall.  However, on Friday she was stuffing some plastic bags into other bags when she got an exquisite sharp pain of her left bicep.  She states that her left tricep began spasming shortly after this.  She states that even trying to hold her phone flat in her palm caused pain in the region of her left bicep.  Patient states that about 1 to 2 days after this injury she noted some bruising start up at the base of her left bicep.   PERTINENT  PMH / PSH: She had an ultrasound of her left shoulder in 2016 which did show some fluid around the insertion of the bicep and pec suggesting separation.  OBJECTIVE:   BP 118/76    Ht 5\' 6"  (1.676 m)    Wt 175 lb (79.4 kg)    LMP 03/09/2011    BMI 28.25 kg/m    Shoulder/upper arm, left: No evidence of bony deformity, asymmetry, or muscle atrophy; patient does endorse some mild tenderness over long head of biceps (bicipital groove) but has tenderness throughout biceps muscle. Full active and passive range of motion (180 flex Huel Cote /150Abd /90ER /70IR),  Strength 5/5 in elbow flexion and extension.  Patient does have some pain with Yergason testing.  She does have significant ecchymosis present over the body of the biceps muscle. Sensation intact. Peripheral pulses intact.  Ultrasound upper arm: -Biceps tendon: Well visualized within the bicipital groove. Disruption visualized at proximal musculotendinous junction with areas suggestive of hematoma around the musculotendinous junction of the proximal bicep as well as some small deformity in that area consistent with a partial tear in the biceps  musculature. Conclusion:  Ultrasound finding consistent with partial tear of the proximal biceps musculature in the region of the musculotendinous junction.   ASSESSMENT/PLAN:   Traumatic partial tear of biceps tendon, initial encounter Assessment:  64 y.o. female with soreness and bruising in the region of the left bicep after an injury which occurred during 2 instances over the past 10 days.  10 days ago she had some significant pain to her left bicep when a coworker dropped a box that she was assisting them carry.  She seemed to do well after this but then got in exquisite pain a few days prior to this encounter when she was simply stuffing plastic bags and other plastic bags.  On physical exam the patient does have significant ecchymosis in the region of the bulk of the biceps musculature, she does have pain in the body of the biceps, she also has mild discomfort with Yergason's testing.  Patient does have 5 of 5 strength in bicep and tricep strength testing.  Limited ultrasound performed does show evidence of a partial tear of the proximal biceps musculature in the region of muscular to cutaneous junction of along with some surrounding hematoma.  Patient likely injured her bicep initially 10 days ago and then caused further damage when she was stuffing plastic bags into other plastic bags on Friday of this past week. Plan: -Discussed this with patient along with the results of her ultrasound.  Patient will minimize lifting, pushing,  and pulling instances to no more than 1 pound.  She will continue to use ice.  She will also work with some light mobility exercises to make sure that she does not limit her range of motion. -She will continue with acetaminophen for pain control with some ibuprofen as needed for breakthrough pain. -Patient to follow-up in 2 weeks.       Lurline Del, [place]    This note was prepared using Dragon voice recognition software and may  include unintentional dictation errors due to the inherent limitations of voice recognition software.

## 2019-12-22 NOTE — Addendum Note (Signed)
Addended by: Dene Gentry on: 12/22/2019 08:46 AM   Modules accepted: Orders

## 2020-01-03 ENCOUNTER — Other Ambulatory Visit: Payer: Self-pay | Admitting: Sports Medicine

## 2020-01-03 DIAGNOSIS — M5442 Lumbago with sciatica, left side: Secondary | ICD-10-CM

## 2020-01-03 MED ORDER — HYDROCODONE-IBUPROFEN 7.5-200 MG PO TABS: 1.0000 | ORAL_TABLET | Freq: Three times a day (TID) | ORAL | 0 refills | Status: DC | PRN

## 2020-01-09 ENCOUNTER — Encounter: Payer: Self-pay | Admitting: Family Medicine

## 2020-01-09 ENCOUNTER — Ambulatory Visit: Payer: Self-pay

## 2020-01-09 ENCOUNTER — Other Ambulatory Visit: Payer: Self-pay

## 2020-01-09 ENCOUNTER — Ambulatory Visit: Payer: BC Managed Care – PPO | Admitting: Family Medicine

## 2020-01-09 VITALS — BP 148/92 | Ht 66.0 in | Wt 175.0 lb

## 2020-01-09 DIAGNOSIS — S46212D Strain of muscle, fascia and tendon of other parts of biceps, left arm, subsequent encounter: Secondary | ICD-10-CM | POA: Diagnosis not present

## 2020-01-09 NOTE — Patient Instructions (Signed)
You have a tear of your biceps at the musculotendinous junction. Icing as needed. Ibuprofen only if needed Start light strengthening exercises shown - no more than 1 pound or hammer. Try to minimize lifting 5+ pounds and pushing heavy items for the next 4 weeks. Follow up with me in 4 weeks for reevaluation.

## 2020-01-09 NOTE — Progress Notes (Signed)
    SUBJECTIVE:   CHIEF COMPLAINT / HPI:   Left bicep pain: Patient is a 64 year old female that presents with left bicep pain that started initially 10 days ago when she was carrying a heavy box with a coworker who suddenly dropped the box putting strain on her bicep.  Patient states that she had some initial pain from this but tolerated it pretty well overall.  However, on Friday she was stuffing some plastic bags into other bags when she got an exquisite sharp pain of her left bicep.  She states that her left tricep began spasming shortly after this.  She states that even trying to hold her phone flat in her palm caused pain in the region of her left bicep.  Patient states that about 1 to 2 days after this injury she noted some bruising start up at the base of her left bicep.  11/29 Patient is here for follow up partial tear of proximal left bicep 2.5 weeks ago.  Her pain has greatly improved in the left bicep.  Her pain at time of the tear was 10 out of 10 and is improved to 1 out of 10.  She has taken ibuprofen as needed, but tapered off.  She has avoided lifting or using her left bicep since being seen in the office.   PERTINENT  PMH / PSH: She had an ultrasound of her left shoulder in 2016 which did show some fluid around the insertion of the bicep and pec suggesting separation.  OBJECTIVE:   BP (!) 148/92   Ht 5\' 6"  (1.676 m)   Wt 175 lb (79.4 kg)   LMP 03/09/2011   BMI 28.25 kg/m    Gen: NAD, comfortable in exam room  Left shoulder: No swelling, ecchymoses.  No gross deformity. Mild TTP proximal biceps.  No other tenderness. FROM. Mild discomfort Yergasons, speeds. Strength 5/5 with empty can and resisted internal/external rotation. Negative apprehension. NV intact distally.  MSK u/s left shoulder:  Long head biceps tendon at musculotendinous junction with high grade partial thickness tear - noted some retraction into upper arm of biceps musculature.  No abnormalities of  short head.    ASSESSMENT/PLAN:   Traumatic partial tear of biceps tendon, subsequent encounter  Patient is doing well clinically about 2.5 weeks out from proximal biceps tear at musculotendinous junction.  Some retraction noted.  Icing, ibuprofen only if needed.  Start light strengthening exercises which were demonstrated today but minimize lifting over 5 pounds for next 4 weeks.  Follow up at that time.  We discussed formal PT but she states she's exhausted her visits for the year.     Lorene Dy, MD [place]    This note was prepared using Dragon voice recognition software and may include unintentional dictation errors due to the inherent limitations of voice recognition software.

## 2020-01-31 ENCOUNTER — Other Ambulatory Visit: Payer: Self-pay

## 2020-01-31 ENCOUNTER — Ambulatory Visit: Payer: BC Managed Care – PPO | Admitting: Sports Medicine

## 2020-01-31 DIAGNOSIS — M12812 Other specific arthropathies, not elsewhere classified, left shoulder: Secondary | ICD-10-CM | POA: Diagnosis not present

## 2020-01-31 DIAGNOSIS — M5442 Lumbago with sciatica, left side: Secondary | ICD-10-CM | POA: Diagnosis not present

## 2020-01-31 DIAGNOSIS — M7581 Other shoulder lesions, right shoulder: Secondary | ICD-10-CM | POA: Diagnosis not present

## 2020-01-31 DIAGNOSIS — M75102 Unspecified rotator cuff tear or rupture of left shoulder, not specified as traumatic: Secondary | ICD-10-CM

## 2020-01-31 MED ORDER — HYDROCODONE-IBUPROFEN 7.5-200 MG PO TABS: 1.0000 | ORAL_TABLET | Freq: Three times a day (TID) | ORAL | 0 refills | Status: DC | PRN

## 2020-01-31 NOTE — Patient Instructions (Signed)
You have a rotator cuff tear and a biceps tendon tear. -Work on the range of motion exercises we gave you today, no strengthening. -You can use arnica gel on the shoulder  -Turmeric 500 mg twice a day -No lifting greater than 20 lbs. Work note provided -Follow up with [place] in about 4-6 weeks to check your progress  Call us with any questions in the meantime!

## 2020-02-01 DIAGNOSIS — M7581 Other shoulder lesions, right shoulder: Secondary | ICD-10-CM | POA: Insufficient documentation

## 2020-02-01 NOTE — Assessment & Plan Note (Signed)
She still has good function but a significant tear I suggested she limit her lifting at work to no more than 20 pounds Be cautious with any overhead activity Continue range of motion but because her work requires lifting all day long I do not think she needs to continue to add strengthening exercises  She had difficulty with nitroglycerin in the past but we will use Arnica gel and ibuprofen as needed  Recheck in 2 to 3 months

## 2020-02-01 NOTE — Assessment & Plan Note (Signed)
I think her continued lifting and chronic activity with her work has aggravated both shoulders She does need to limit her lifting Trial with Arnica gel If this becomes more painful we can consider injection

## 2020-02-01 NOTE — Progress Notes (Signed)
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 Her injury occurred when a coworker let loose of a box they were carrying   the weight seem to put a lot of pressure on her left shoulder  She has improved a lot since the initial injury but still has pain in the left shoulder with lifting or with too much elevation  Now the right shoulder is showing some pain in the upper arm This is also worse with lifting anything more than 10 to 15 pounds  Past history She has had a thoracic disc and some lumbar disc issues for which she occasionally uses Vicoprofen 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 ROM is full in all planes. Rotator cuff strength normal throughout. Some pain with  impingement on Neer and Hawkin's tests, empty can. Speeds and Yergason's tests normal - slight pain but good strength No labral pathology noted with negative Obrien's, negative clunk and good stability. Normal scapular function observed. No painful arc and no drop arm sign. No apprehension sign  Right shoulder exam was not remarkable except for some proximal anterior pain - mild with strength testing x resistance  Ultrasound of bilateral shoulders Left shoulder There is a proximal high-grade tear of the biceps tendon There is some hypoechoic change on long axis view along the medial border of the tendon but most fibers are intact Supraspinatus seems retracted  with hypoechoic change all along the footplate Subscapularis appears somewhat atrophied Infraspinatus and teres minor are intact AC joint with some mild hypoechoic change  Impression: Retracted supraspinatus tear and partial biceps tendon tear  Right shoulder Biceps tendon appears intact on long axis and short axis Supraspinatus and subscapularis tendons are intact but the muscle seems somewhat atrophied Teres minor and infraspinatus are intact AC joint with mild change but above the AC joint there is a bursal type swelling with calcifications  Impression: 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

## 2020-02-17 ENCOUNTER — Telehealth: Payer: Self-pay | Admitting: Internal Medicine

## 2020-02-17 NOTE — Telephone Encounter (Signed)
The pt states she has reflux and would like to speak with Dr Henrene Pastor about having an EGD.  She has not been seen since 2018.  She states she does not want to see an app and prefers only to speak with Dr Henrene Pastor.  I have scheduled her an appt for 2/17 and advised her that she should also call her PCP and make an appt to discuss. She did confirm that she has an appt on WED.with PCP.  She also told me that she is NOT having swelling of the face but is more concerned about having and EGD and would like to discuss with Dr Henrene Pastor personally.

## 2020-02-22 ENCOUNTER — Other Ambulatory Visit: Payer: Self-pay

## 2020-02-22 ENCOUNTER — Encounter: Payer: Self-pay | Admitting: Adult Health Nurse Practitioner

## 2020-02-22 ENCOUNTER — Ambulatory Visit (INDEPENDENT_AMBULATORY_CARE_PROVIDER_SITE_OTHER): Payer: BC Managed Care – PPO | Admitting: Adult Health Nurse Practitioner

## 2020-02-22 VITALS — BP 136/88 | HR 84 | Temp 97.7°F | Ht 65.5 in | Wt 178.8 lb

## 2020-02-22 DIAGNOSIS — Z130 Encounter for screening for diseases of the blood and blood-forming organs and certain disorders involving the immune mechanism: Secondary | ICD-10-CM

## 2020-02-22 DIAGNOSIS — F3342 Major depressive disorder, recurrent, in full remission: Secondary | ICD-10-CM

## 2020-02-22 DIAGNOSIS — Z136 Encounter for screening for cardiovascular disorders: Secondary | ICD-10-CM

## 2020-02-22 DIAGNOSIS — Z0001 Encounter for general adult medical examination with abnormal findings: Secondary | ICD-10-CM

## 2020-02-22 DIAGNOSIS — Z6834 Body mass index (BMI) 34.0-34.9, adult: Secondary | ICD-10-CM

## 2020-02-22 DIAGNOSIS — Z0000 Encounter for general adult medical examination without abnormal findings: Secondary | ICD-10-CM

## 2020-02-22 DIAGNOSIS — R7309 Other abnormal glucose: Secondary | ICD-10-CM

## 2020-02-22 DIAGNOSIS — Z13220 Encounter for screening for lipoid disorders: Secondary | ICD-10-CM

## 2020-02-22 DIAGNOSIS — Z1389 Encounter for screening for other disorder: Secondary | ICD-10-CM | POA: Diagnosis not present

## 2020-02-22 DIAGNOSIS — E559 Vitamin D deficiency, unspecified: Secondary | ICD-10-CM | POA: Diagnosis not present

## 2020-02-22 DIAGNOSIS — Z1329 Encounter for screening for other suspected endocrine disorder: Secondary | ICD-10-CM

## 2020-02-22 DIAGNOSIS — E782 Mixed hyperlipidemia: Secondary | ICD-10-CM

## 2020-02-22 DIAGNOSIS — I10 Essential (primary) hypertension: Secondary | ICD-10-CM

## 2020-02-22 DIAGNOSIS — Z131 Encounter for screening for diabetes mellitus: Secondary | ICD-10-CM

## 2020-02-22 DIAGNOSIS — Z1321 Encounter for screening for nutritional disorder: Secondary | ICD-10-CM

## 2020-02-22 DIAGNOSIS — Z79899 Other long term (current) drug therapy: Secondary | ICD-10-CM

## 2020-02-22 DIAGNOSIS — K130 Diseases of lips: Secondary | ICD-10-CM

## 2020-02-22 DIAGNOSIS — F3341 Major depressive disorder, recurrent, in partial remission: Secondary | ICD-10-CM

## 2020-02-22 MED ORDER — NYSTATIN 100000 UNIT/ML MT SUSP: OROMUCOSAL | 0 refills | Status: DC

## 2020-02-22 MED ORDER — NYSTATIN 100000 UNIT/GM EX POWD: 1.0000 "application " | Freq: Three times a day (TID) | CUTANEOUS | 0 refills | Status: DC

## 2020-02-22 MED ORDER — NYSTATIN 100000 UNIT/GM EX CREA: 1.0000 "application " | TOPICAL_CREAM | Freq: Two times a day (BID) | CUTANEOUS | 1 refills | Status: DC

## 2020-02-22 MED ORDER — FLUCONAZOLE 150 MG PO TABS: ORAL_TABLET | ORAL | 0 refills | Status: DC
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
## 2020-02-24 ENCOUNTER — Ambulatory Visit: Payer: BC Managed Care – PPO | Admitting: Family Medicine

## 2020-02-24 ENCOUNTER — Encounter: Payer: Self-pay | Admitting: Family Medicine

## 2020-02-24 ENCOUNTER — Other Ambulatory Visit: Payer: Self-pay

## 2020-02-24 VITALS — BP 148/92 | Ht 66.5 in | Wt 178.0 lb

## 2020-02-24 DIAGNOSIS — M545 Low back pain, unspecified: Secondary | ICD-10-CM

## 2020-02-24 LAB — CBC WITH DIFFERENTIAL/PLATELET
Absolute Monocytes: 656 cells/uL (ref 200–950)
Basophils Absolute: 48 cells/uL (ref 0–200)
Basophils Relative: 0.7 %
Eosinophils Absolute: 304 cells/uL (ref 15–500)
Eosinophils Relative: 4.4 %
HCT: 39 % (ref 35.0–45.0)
Hemoglobin: 13.1 g/dL (ref 11.7–15.5)
Lymphs Abs: 1842 cells/uL (ref 850–3900)
MCH: 30.5 pg (ref 27.0–33.0)
MCHC: 33.6 g/dL (ref 32.0–36.0)
MCV: 90.7 fL (ref 80.0–100.0)
MPV: 9.6 fL (ref 7.5–12.5)
Monocytes Relative: 9.5 %
Neutro Abs: 4050 cells/uL (ref 1500–7800)
Neutrophils Relative %: 58.7 %
Platelets: 308 10*3/uL (ref 140–400)
RBC: 4.3 10*6/uL (ref 3.80–5.10)
RDW: 12.2 % (ref 11.0–15.0)
Total Lymphocyte: 26.7 %
WBC: 6.9 10*3/uL (ref 3.8–10.8)

## 2020-02-24 LAB — COMPLETE METABOLIC PANEL WITH GFR
AG Ratio: 1.8 (calc) (ref 1.0–2.5)
ALT: 23 U/L (ref 6–29)
AST: 22 U/L (ref 10–35)
Albumin: 4.5 g/dL (ref 3.6–5.1)
Alkaline phosphatase (APISO): 48 U/L (ref 37–153)
BUN: 19 mg/dL (ref 7–25)
CO2: 28 mmol/L (ref 20–32)
Calcium: 10.9 mg/dL — ABNORMAL HIGH (ref 8.6–10.4)
Chloride: 107 mmol/L (ref 98–110)
Creat: 0.74 mg/dL (ref 0.50–0.99)
GFR, Est African American: 101 mL/min/{1.73_m2} (ref >=60)
GFR, Est Non African American: 87 mL/min/{1.73_m2} (ref >=60)
Globulin: 2.5 g/dL (calc) (ref 1.9–3.7)
Glucose, Bld: 93 mg/dL (ref 65–99)
Potassium: 4.5 mmol/L (ref 3.5–5.3)
Sodium: 141 mmol/L (ref 135–146)
Total Bilirubin: 0.4 mg/dL (ref 0.2–1.2)
Total Protein: 7 g/dL (ref 6.1–8.1)

## 2020-02-24 LAB — IRON, TOTAL/TOTAL IRON BINDING CAP
%SAT: 24 % (calc) (ref 16–45)
Iron: 85 ug/dL (ref 45–160)
TIBC: 359 mcg/dL (calc) (ref 250–450)

## 2020-02-24 LAB — URINE CULTURE
MICRO NUMBER:: 11411668
SPECIMEN QUALITY:: ADEQUATE

## 2020-02-24 LAB — LIPID PANEL
Cholesterol: 248 mg/dL — ABNORMAL HIGH (ref <200)
HDL: 66 mg/dL (ref >=50)
LDL Cholesterol (Calc): 155 mg/dL (calc) — ABNORMAL HIGH
Non-HDL Cholesterol (Calc): 182 mg/dL (calc) — ABNORMAL HIGH (ref <130)
Total CHOL/HDL Ratio: 3.8 (calc) (ref <5.0)
Triglycerides: 138 mg/dL (ref <150)

## 2020-02-24 LAB — URINALYSIS W MICROSCOPIC + REFLEX CULTURE
Bacteria, UA: NONE SEEN /HPF
Bilirubin Urine: NEGATIVE
Glucose, UA: NEGATIVE
Hgb urine dipstick: NEGATIVE
Hyaline Cast: NONE SEEN /LPF
Ketones, ur: NEGATIVE
Nitrites, Initial: NEGATIVE
Protein, ur: NEGATIVE
RBC / HPF: NONE SEEN /HPF (ref 0–2)
Specific Gravity, Urine: 1.009 (ref 1.001–1.03)
Squamous Epithelial / HPF: NONE SEEN /HPF (ref <=5)
pH: 6 (ref 5.0–8.0)

## 2020-02-24 LAB — HEMOGLOBIN A1C
Hgb A1c MFr Bld: 5.5 % of total Hgb (ref <5.7)
Mean Plasma Glucose: 111 mg/dL
eAG (mmol/L): 6.2 mmol/L

## 2020-02-24 LAB — TSH: TSH: 1.28 mIU/L (ref 0.40–4.50)

## 2020-02-24 LAB — VITAMIN B12: Vitamin B-12: 521 pg/mL (ref 200–1100)

## 2020-02-24 LAB — VITAMIN D 25 HYDROXY (VIT D DEFICIENCY, FRACTURES): Vit D, 25-Hydroxy: 30 ng/mL (ref 30–100)

## 2020-02-24 LAB — CULTURE INDICATED

## 2020-02-24 MED ORDER — PREDNISONE 20 MG PO TABS: 40.0000 mg | ORAL_TABLET | Freq: Every day | ORAL | 0 refills | Status: DC

## 2020-02-24 NOTE — Patient Instructions (Signed)
We are so sorry to hear that you are continuing to be in pain.  We encourage you to try your Flexeril 10 mg at night and then start with 5 in the morning/5 in the afternoon to see how you tolerate this.  You can increase your hydrocodone/ibuprofen up to twice a day as needed.  You can try ice/heat 20 minutes at a time several times a day, or use 1/the other if 1 seems to help more.  We also sent a short steroid course since this seemed to help you in the past.  If you are not feeling better by next week or sooner if you have onset of muscle weakness, significant numbness/tingling, or any changes in your bowel/bladder function please call us/go to the ED.

## 2020-02-24 NOTE — Assessment & Plan Note (Signed)
Acute on chronic, with severe discomfort and muscular spasms.  Likely multifactorial with known multilevel degenerative changes and possible disc herniation.  Reassuringly strength preserved without any radicular s/sx. For acute relief, Rx'd short prednisone course X 5 days as she has found relief with this in the past.  Additionally may take Flexeril 5-10mg  TID PRN and increase her Vicoprofen to twice daily as needed.  Ice/heat several times daily with general rest.  Long-term, may need to discuss trial of epidural injections.
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
## 2020-02-24 NOTE — Addendum Note (Signed)
Addended byDorcas Mcmurray L on: 02/24/2020 03:36 PM   Modules accepted: Level of Service

## 2020-02-27 ENCOUNTER — Other Ambulatory Visit: Payer: Self-pay

## 2020-02-27 DIAGNOSIS — M545 Low back pain, unspecified: Secondary | ICD-10-CM

## 2020-02-27 DIAGNOSIS — M5442 Lumbago with sciatica, left side: Secondary | ICD-10-CM

## 2020-02-28 ENCOUNTER — Other Ambulatory Visit: Payer: Self-pay | Admitting: Sports Medicine

## 2020-02-28 ENCOUNTER — Encounter: Payer: BC Managed Care – PPO | Admitting: Physician Assistant

## 2020-02-28 DIAGNOSIS — M5442 Lumbago with sciatica, left side: Secondary | ICD-10-CM

## 2020-02-28 DIAGNOSIS — M545 Low back pain, unspecified: Secondary | ICD-10-CM

## 2020-02-28 MED ORDER — PREDNISONE 20 MG PO TABS: 40.0000 mg | ORAL_TABLET | Freq: Every day | ORAL | 0 refills | Status: AC

## 2020-02-28 MED ORDER — HYDROCODONE-IBUPROFEN 7.5-200 MG PO TABS: 1.0000 | ORAL_TABLET | Freq: Three times a day (TID) | ORAL | 0 refills | Status: DC | PRN

## 2020-03-06 ENCOUNTER — Ambulatory Visit: Payer: BC Managed Care – PPO | Admitting: Sports Medicine

## 2020-03-12 ENCOUNTER — Other Ambulatory Visit: Payer: Self-pay

## 2020-03-12 DIAGNOSIS — K130 Diseases of lips: Secondary | ICD-10-CM

## 2020-03-13 ENCOUNTER — Ambulatory Visit (INDEPENDENT_AMBULATORY_CARE_PROVIDER_SITE_OTHER): Payer: BC Managed Care – PPO | Admitting: Sports Medicine

## 2020-03-13 ENCOUNTER — Other Ambulatory Visit: Payer: Self-pay

## 2020-03-13 DIAGNOSIS — M75102 Unspecified rotator cuff tear or rupture of left shoulder, not specified as traumatic: Secondary | ICD-10-CM | POA: Diagnosis not present

## 2020-03-13 DIAGNOSIS — M12812 Other specific arthropathies, not elsewhere classified, left shoulder: Secondary | ICD-10-CM

## 2020-03-13 MED ORDER — NITROGLYCERIN 0.2 MG/HR TD PT24: MEDICATED_PATCH | TRANSDERMAL | 1 refills | Status: DC

## 2020-03-13 NOTE — Progress Notes (Signed)
Chief complaint left shoulder pain follow-up  Patient was seen about 6 weeks ago with a rotator cuff tear and a bicipital tendon tear She works in an [employer] and does a lot of lifting of bowels and boxes She has stayed away from this lifting since her last visit The shoulder is significantly less painful She was not able to use the nitroglycerin because she had side effects which possibly could have been related of some palpitations She did not get the Arnica gel yet She did work on her range of motion exercises  She was also recently seen for low back pain That has improved significantly following the prednisone Dosepak She continues easy exercises for her low back  Review of systems Left arm continues to feel somewhat weak No radicular symptoms into the arm No sciatica from her back  Physical exam Pleasant female in no acute distress  BP (!) 162/87   Ht 5' 6.5" (1.689 m)   Wt 178 lb (80.7 kg)   LMP 03/09/2011   BMI 28.30 kg/m   Both shoulders show full range of motion Left shoulder motion is now nonpainful Weakness with empty can and supraspinatus testing Speeds and Yergason test do not create pain and her strength has improved Infraspinatus and subscapularis testing normal  Able to walk and do flexion extension of the low back without any significant symptoms

## 2020-03-13 NOTE — Patient Instructions (Addendum)
Start rotator cuff exercises Spokes of wheel Rotation biceps curls  Ice massage  Try arnica or small dose of NTG  Reck 6 weeks  Nitroglycerin Protocol   Apply 1/4 nitroglycerin patch to affected area daily.  Change position of patch within the affected area every 24 hours.  You may experience a headache during the first 1-2 weeks of using the patch, these should subside.  If you experience headaches after beginning nitroglycerin patch treatment, you may take your preferred over the counter pain reliever.  Another side effect of the nitroglycerin patch is skin irritation or rash related to patch adhesive.  Please notify our office if you develop more severe headaches or rash, and stop the patch.  Tendon healing with nitroglycerin patch may require 12 to 24 weeks depending on the extent of injury.  Men should not use if taking Viagra, Cialis, or Levitra.   Do not use if you have migraines or rosacea.

## 2020-03-13 NOTE — Assessment & Plan Note (Signed)
She is making progress with her symptoms Her biceps function has improved She still very weak with supraspinatus testing Pain on testing is much less  Today we instructed her and started her on easy rotator cuff exercises She may retry the nitroglycerin but if not we will certainly get the Arnica and start using that  She still should not try lifting anything over 10 pounds with her left arm Recheck in 6 weeks

## 2020-03-14 ENCOUNTER — Other Ambulatory Visit: Payer: Self-pay

## 2020-03-14 ENCOUNTER — Other Ambulatory Visit: Payer: Self-pay | Admitting: Adult Health Nurse Practitioner

## 2020-03-14 DIAGNOSIS — K130 Diseases of lips: Secondary | ICD-10-CM

## 2020-03-29 ENCOUNTER — Ambulatory Visit (INDEPENDENT_AMBULATORY_CARE_PROVIDER_SITE_OTHER): Payer: BC Managed Care – PPO | Admitting: Internal Medicine

## 2020-03-29 ENCOUNTER — Other Ambulatory Visit: Payer: Self-pay

## 2020-03-29 ENCOUNTER — Encounter: Payer: Self-pay | Admitting: Internal Medicine

## 2020-03-29 ENCOUNTER — Other Ambulatory Visit: Payer: Self-pay | Admitting: Family Medicine

## 2020-03-29 VITALS — BP 156/76 | HR 84 | Ht 66.0 in | Wt 174.0 lb

## 2020-03-29 DIAGNOSIS — K219 Gastro-esophageal reflux disease without esophagitis: Secondary | ICD-10-CM

## 2020-03-29 DIAGNOSIS — Z86010 Personal history of colonic polyps: Secondary | ICD-10-CM | POA: Diagnosis not present

## 2020-03-29 DIAGNOSIS — M5442 Lumbago with sciatica, left side: Secondary | ICD-10-CM

## 2020-03-29 DIAGNOSIS — R1310 Dysphagia, unspecified: Secondary | ICD-10-CM

## 2020-03-29 MED ORDER — HYDROCODONE-IBUPROFEN 7.5-200 MG PO TABS: 1.0000 | ORAL_TABLET | Freq: Three times a day (TID) | ORAL | 0 refills | Status: DC | PRN

## 2020-03-29 MED ORDER — SUTAB 1479-225-188 MG PO TABS: 1.0000 | ORAL_TABLET | Freq: Once | ORAL | 0 refills | Status: AC

## 2020-03-29 MED ORDER — PANTOPRAZOLE SODIUM 40 MG PO TBEC: 40.0000 mg | DELAYED_RELEASE_TABLET | Freq: Every day | ORAL | 3 refills | Status: DC

## 2020-03-29 NOTE — Patient Instructions (Signed)
If you are age 63 or older, your body mass index should be between 23-30. Your Body mass index is 28.08 kg/m. If this is out of the aforementioned range listed, please consider follow up with your Primary Care Provider.  If you are age 34 or younger, your body mass index should be between 19-25. Your Body mass index is 28.08 kg/m. If this is out of the aformentioned range listed, please consider follow up with your Primary Care Provider.     We have sent the following medications to your pharmacy for you to pick up at your convenience:  Pantoprazole  You have been scheduled for a colonoscopy. Please follow written instructions given to you at your visit today.  Please pick up your prep supplies at the pharmacy within the next 1-3 days. If you use inhalers (even only as needed), please bring them with you on the day of your procedure.

## 2020-03-29 NOTE — Progress Notes (Signed)
Refilled norco.   Rosemarie Ax, MD [facility] 03/29/2020, 4:46 PM

## 2020-04-02 ENCOUNTER — Encounter: Payer: Self-pay | Admitting: Internal Medicine
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
## 2020-04-24 ENCOUNTER — Ambulatory Visit: Payer: BC Managed Care – PPO | Admitting: Sports Medicine

## 2020-04-25 ENCOUNTER — Other Ambulatory Visit: Payer: Self-pay | Admitting: Sports Medicine

## 2020-04-25 DIAGNOSIS — M5442 Lumbago with sciatica, left side: Secondary | ICD-10-CM

## 2020-04-25 MED ORDER — HYDROCODONE-IBUPROFEN 7.5-200 MG PO TABS: 1.0000 | ORAL_TABLET | Freq: Three times a day (TID) | ORAL | 0 refills | Status: DC | PRN

## 2020-05-07 DIAGNOSIS — K136 Irritative hyperplasia of oral mucosa: Secondary | ICD-10-CM | POA: Diagnosis not present

## 2020-05-07 DIAGNOSIS — K1321 Leukoplakia of oral mucosa, including tongue: Secondary | ICD-10-CM | POA: Diagnosis not present

## 2020-05-15 ENCOUNTER — Other Ambulatory Visit: Payer: Self-pay

## 2020-05-15 ENCOUNTER — Ambulatory Visit: Payer: BC Managed Care – PPO | Admitting: Sports Medicine

## 2020-05-15 ENCOUNTER — Encounter: Payer: Self-pay | Admitting: Sports Medicine

## 2020-05-15 VITALS — BP 132/80 | Ht 66.0 in | Wt 163.0 lb

## 2020-05-15 DIAGNOSIS — S46212D Strain of muscle, fascia and tendon of other parts of biceps, left arm, subsequent encounter: Secondary | ICD-10-CM

## 2020-05-15 NOTE — Progress Notes (Signed)
   PCP: Unk Pinto, MD  Subjective:   HPI: Patient is a 65 y.o. female here for follow-up on left shoulder injury.  She was seen by Dr. Oneida Alar on 03/13/2020, at that time was evaluated for partial rotator cuff tear and biceps tendon tear.  She was treated with topical nitroglycerin, home exercise program, and lifting restrictions.  She works at the [employer] and her job involves rather lifting of heavy boxes.  Since then, she reports that she has been intermittently adherent to HEP.  She does feel like she has had some improvement and felt the nitroglycerin was very helpful.  She continues to have weakness mostly with biceps activities.  She does feel like her shoulder range of motion and pain has improved significantly.  She stopped using nitroglycerin because she was having some numbness and tingling in her feet that she thought may be related.  She is still on light duty and has tried to lift heavier boxes on occasion with recurrence of pain.  She denies any numbness, tingling or weakness in her left upper extremity.  No new trauma or injury since last visit.   Review of Systems:  Per HPI.   [place], medications and smoking status reviewed.      Objective:  Physical Exam: BP 132/80   Ht 5\' 6"  (1.676 m)   Wt 163 lb (73.9 kg)   LMP 03/09/2011   BMI 26.31 kg/m   No flowsheet data found.   Gen: awake, alert, NAD, comfortable in exam room Pulm: breathing unlabored  Left shoulder: -Inspection: no obvious deformity, atrophy, or asymmetry. No bruising. No swelling -Palpation: Mildly tender over bicipital groove and proximal muscle belly of the biceps -ROM: Full ROM in abduction, flexion, internal/external rotation both passively and actively  NV intact distally Normal scapular function observed. Special Tests:  - Impingement: Neg Hawkins, Neg neers, Neg empty can sign. - Supraspinatus: Negative empty can.  5/5 strength with resisted flexion at 20 degrees - Infraspinatus/Teres  Minor: 5/5 strength with ER - Subscapularis: 5/5 strength with IR - Biceps tendon:  Mildly positive speeds, negative Yergason's, mild weakness with resisted elbow flexion - Labrum: Negative Obriens, good stability - No painful arc and no drop arm sign  Limited ultrasound examination of the left biceps tendon shows interval healing with decreased hypoechogenicity at the proximal biceps compared to previous ultrasound.  Ultrasound performed interpreted by Dagoberto Ligas, MD and Dyke Brackett, MD    Assessment & Plan:  1.  Left biceps tendon partial tear and rotator cuff partial tear Patient with continued slow improvement in her symptoms.  Her rotator cuff symptoms appear to have almost completely resolved, however she continues to have pain with biceps contraction.  Her ultrasound today is very reassuring that she is healing, and we discussed that this may take significant more time to resolve.  Plan: -Continue home exercise program given last visit, can add weight starting with 1 pound and building up to 3 pounds as tolerated. -Continue nitroglycerin patches, discussed that neuropathy is not a side effect of this and do not suspect this to be the cause -Continue light duty for now, okay to transition back to lifting as tolerated based on pain -Follow-up as needed if symptoms do not continue to improve   Dagoberto Ligas, MD [facility] 05/15/2020 11:24 AM  I observed and examined the patient with the [facility] resident and agree with assessment and plan.  Note reviewed and modified by me. Ila Mcgill, MD

## 2020-05-23 ENCOUNTER — Other Ambulatory Visit: Payer: Self-pay | Admitting: Family Medicine

## 2020-05-23 DIAGNOSIS — M5442 Lumbago with sciatica, left side: Secondary | ICD-10-CM

## 2020-05-23 MED ORDER — HYDROCODONE-IBUPROFEN 7.5-200 MG PO TABS: 1.0000 | ORAL_TABLET | Freq: Three times a day (TID) | ORAL | 0 refills | Status: DC | PRN

## 2020-05-23 NOTE — Progress Notes (Signed)
Refilled vicoprofen.   Rosemarie Ax, MD [facility] 05/23/2020, 8:29 AM

## 2020-05-28 ENCOUNTER — Other Ambulatory Visit: Payer: Self-pay | Admitting: Adult Health

## 2020-06-18 ENCOUNTER — Other Ambulatory Visit: Payer: Self-pay

## 2020-06-18 ENCOUNTER — Other Ambulatory Visit: Payer: Self-pay | Admitting: Internal Medicine

## 2020-06-18 ENCOUNTER — Ambulatory Visit (AMBULATORY_SURGERY_CENTER): Payer: BC Managed Care – PPO | Admitting: Internal Medicine

## 2020-06-18 ENCOUNTER — Encounter: Payer: Self-pay | Admitting: Internal Medicine

## 2020-06-18 VITALS — BP 121/68 | HR 54 | Temp 97.1°F | Resp 10 | Ht 66.0 in | Wt 174.0 lb

## 2020-06-18 DIAGNOSIS — R1310 Dysphagia, unspecified: Secondary | ICD-10-CM

## 2020-06-18 DIAGNOSIS — D124 Benign neoplasm of descending colon: Secondary | ICD-10-CM

## 2020-06-18 DIAGNOSIS — K635 Polyp of colon: Secondary | ICD-10-CM

## 2020-06-18 DIAGNOSIS — K219 Gastro-esophageal reflux disease without esophagitis: Secondary | ICD-10-CM | POA: Diagnosis not present

## 2020-06-18 DIAGNOSIS — Z86010 Personal history of colonic polyps: Secondary | ICD-10-CM

## 2020-06-18 DIAGNOSIS — D122 Benign neoplasm of ascending colon: Secondary | ICD-10-CM

## 2020-06-18 MED ORDER — SODIUM CHLORIDE 0.9 % IV SOLN: 500.0000 mL | Freq: Once | INTRAVENOUS | Status: DC

## 2020-06-18 NOTE — Progress Notes (Signed)
Called to room to assist during endoscopic procedure.  Patient ID and intended procedure confirmed with present staff. Received instructions for my participation in the procedure from the performing physician.  

## 2020-06-18 NOTE — Op Note (Signed)
[place] Patient Name: Gabriela [name] Procedure Date: 06/18/2020 11:00 AM MRN: [id number] Endoscopist: Docia Chuck. Henrene Pastor , MD Age: 65 Referring MD:  Date of Birth: 04-23-1955 Gender: Female Account #: [id number] Procedure:                Colonoscopy with cold snare polypectomy x 2 Indications:              High risk colon cancer surveillance: Personal                            history of sessile serrated colon polyp (less than                            10 mm in size) with no dysplasia. Previous                            examination 2014 Medicines:                Monitored Anesthesia Care Procedure:                Pre-Anesthesia Assessment:                           - Prior to the procedure, a History and Physical                            was performed, and patient medications and                            allergies were reviewed. The patient's tolerance of                            previous anesthesia was also reviewed. The risks                            and benefits of the procedure and the sedation                            options and risks were discussed with the patient.                            All questions were answered, and informed consent                            was obtained. Prior Anticoagulants: The patient has                            taken no previous anticoagulant or antiplatelet                            agents. ASA Grade Assessment: II - A patient with                            mild systemic disease. After reviewing the risks  and benefits, the patient was deemed in                            satisfactory condition to undergo the procedure.                           After obtaining informed consent, the colonoscope                            was passed under direct vision. Throughout the                            procedure, the patient's blood pressure, pulse, and                            oxygen saturations  were monitored continuously. The                            Olympus CF-[id number]) Colonoscope was                            introduced through the anus and advanced to the the                            cecum, identified by appendiceal orifice and                            ileocecal valve. The ileocecal valve, appendiceal                            orifice, and rectum were photographed. The quality                            of the bowel preparation was excellent. The                            colonoscopy was performed without difficulty. The                            patient tolerated the procedure well. The bowel                            preparation used was SUPREP via split dose                            instruction. Scope In: 11:02:54 AM Scope Out: 33:82:50 AM Scope Withdrawal Time: 0 hours 9 minutes 27 seconds  Total Procedure Duration: 0 hours 13 minutes 48 seconds  Findings:                 Two polyps were found in the descending colon and                            ascending colon. The polyps were 3 mm in size.  These polyps were removed with a cold snare.                            Resection and retrieval were complete.                           A few diverticula were found in the sigmoid colon.                           The exam was otherwise without abnormality on                            direct and retroflexion views. Complications:            No immediate complications. Estimated blood loss:                            None. Estimated Blood Loss:     Estimated blood loss: none. Impression:               - Two 3 mm polyps in the descending colon and in                            the ascending colon, removed with a cold snare.                            Resected and retrieved.                           - Diverticulosis in the sigmoid colon.                           - The examination was otherwise normal on direct                             and retroflexion views. Recommendation:           - Repeat colonoscopy in 7-10 years for surveillance.                           - Patient has a contact number available for                            emergencies. The signs and symptoms of potential                            delayed complications were discussed with the                            patient. Return to normal activities tomorrow.                            Written discharge instructions were provided to the                            patient.                           -  Resume previous diet.                           - Continue present medications.                           - Await pathology results. Docia Chuck. Henrene Pastor, MD 06/18/2020 11:21:40 AM This report has been signed electronically.

## 2020-06-18 NOTE — Progress Notes (Signed)
PT taken to PACU. Monitors in place. VSS. Report given to RN. 

## 2020-06-18 NOTE — Op Note (Signed)
[place] Patient Name: Gabriela [name] Procedure Date: 06/18/2020 11:00 AM MRN: [id number] Endoscopist: Docia Chuck. Henrene Pastor , MD Age: 65 Referring MD:  Date of Birth: 1955-03-30 Gender: Female Account #: [id number] Procedure:                Upper GI endoscopy Indications:              Dysphagia (atypical), Esophageal reflux Medicines:                Monitored Anesthesia Care Procedure:                Pre-Anesthesia Assessment:                           - Prior to the procedure, a History and Physical                            was performed, and patient medications and                            allergies were reviewed. The patient's tolerance of                            previous anesthesia was also reviewed. The risks                            and benefits of the procedure and the sedation                            options and risks were discussed with the patient.                            All questions were answered, and informed consent                            was obtained. Prior Anticoagulants: The patient has                            taken no previous anticoagulant or antiplatelet                            agents. ASA Grade Assessment: II - A patient with                            mild systemic disease. After reviewing the risks                            and benefits, the patient was deemed in                            satisfactory condition to undergo the procedure.                           After obtaining informed consent, the endoscope was  passed under direct vision. Throughout the                            procedure, the patient's blood pressure, pulse, and                            oxygen saturations were monitored continuously. The                            Endoscope was introduced through the mouth, and                            advanced to the second part of duodenum. The upper                            GI endoscopy  was accomplished without difficulty.                            The patient tolerated the procedure well. Scope In: Scope Out: Findings:                 The esophagus was normal.                           The stomach was normal. There was an incidental                            small submucosal lesion in the antrum consistent                            with pancreatic rest                           The examined duodenum was normal.                           The cardia and gastric fundus were normal on                            retroflexion. Small hiatal hernia present Complications:            No immediate complications. Estimated Blood Loss:     Estimated blood loss: none. Impression:               1. GERD                           2. Normal esophagus. No stricture                           3. Otherwise unremarkable EGD. Recommendation:           - Patient has a contact number available for                            emergencies. The signs and symptoms of potential  delayed complications were discussed with the                            patient. Return to normal activities tomorrow.                            Written discharge instructions were provided to the                            patient.                           - Resume previous diet.                           - Continue present medications. Docia Chuck. Henrene Pastor, MD 06/18/2020 11:38:41 AM This report has been signed electronically.

## 2020-06-18 NOTE — Patient Instructions (Signed)
Please read handouts provided. Continue present medications. Await pathology results.     YOU HAD AN ENDOSCOPIC PROCEDURE TODAY AT THE [facility]:   Refer to the procedure report that was given to you for any specific questions about what was found during the examination.  If the procedure report does not answer your questions, please call your gastroenterologist to clarify.  If you requested that your care partner not be given the details of your procedure findings, then the procedure report has been included in a sealed envelope for you to review at your convenience later.  YOU SHOULD EXPECT: Some feelings of bloating in the abdomen. Passage of more gas than usual.  Walking can help get rid of the air that was put into your GI tract during the procedure and reduce the bloating. If you had a lower endoscopy (such as a colonoscopy or flexible sigmoidoscopy) you may notice spotting of blood in your stool or on the toilet paper. If you underwent a bowel prep for your procedure, you may not have a normal bowel movement for a few days.  Please Note:  You might notice some irritation and congestion in your nose or some drainage.  This is from the oxygen used during your procedure.  There is no need for concern and it should clear up in a day or so.  SYMPTOMS TO REPORT IMMEDIATELY:   Following lower endoscopy (colonoscopy or flexible sigmoidoscopy):  Excessive amounts of blood in the stool  Significant tenderness or worsening of abdominal pains  Swelling of the abdomen that is new, acute  Fever of 100F or higher   Following upper endoscopy (EGD)  Vomiting of blood or coffee ground material  New chest pain or pain under the shoulder blades  Painful or persistently difficult swallowing  New shortness of breath  Fever of 100F or higher  Black, tarry-looking stools  For urgent or emergent issues, a gastroenterologist can be reached at any hour by calling [phone]. Do not  use MyChart messaging for urgent concerns.    DIET:  We do recommend a small meal at first, but then you may proceed to your regular diet.  Drink plenty of fluids but you should avoid alcoholic beverages for 24 hours.  ACTIVITY:  You should plan to take it easy for the rest of today and you should NOT DRIVE or use heavy machinery until tomorrow (because of the sedation medicines used during the test).    FOLLOW UP: Our staff will call the number listed on your records 48-72 hours following your procedure to check on you and address any questions or concerns that you may have regarding the information given to you following your procedure. If we do not reach you, we will leave a message.  We will attempt to reach you two times.  During this call, we will ask if you have developed any symptoms of COVID 19. If you develop any symptoms (ie: fever, flu-like symptoms, shortness of breath, cough etc.) before then, please call [phone].  If you test positive for Covid 19 in the 2 weeks post procedure, please call and report this information to us.    If any biopsies were taken you will be contacted by phone or by letter within the next 1-3 weeks.  Please call us at [phone] if you have not heard about the biopsies in 3 weeks.    SIGNATURES/CONFIDENTIALITY: You and/or your care partner have signed paperwork which will be entered into your electronic medical   record.  These signatures attest to the fact that that the information above on your After Visit Summary has been reviewed and is understood.  Full responsibility of the confidentiality of this discharge information lies with you and/or your care-partner. 

## 2020-06-19 ENCOUNTER — Other Ambulatory Visit: Payer: Self-pay | Admitting: Family Medicine

## 2020-06-19 DIAGNOSIS — M5442 Lumbago with sciatica, left side: Secondary | ICD-10-CM

## 2020-06-20 ENCOUNTER — Telehealth: Payer: Self-pay

## 2020-06-20 NOTE — Telephone Encounter (Signed)
  Follow up Call-  Call back number 06/18/2020  Post procedure Call Back phone  # [phone]  Permission to leave phone message Yes  Some recent data might be hidden     Patient questions:  Do you have a fever, pain , or abdominal swelling? No. Pain Score  0 *  Have you tolerated food without any problems? Yes.    Have you been able to return to your normal activities? Yes.    Do you have any questions about your discharge instructions: Diet   No. Medications  No. Follow up visit  No.  Do you have questions or concerns about your Care? No.  Actions: * If pain score is 4 or above: No action needed, pain <4.  1. Have you developed a fever since your procedure? no  2.   Have you had an respiratory symptoms (SOB or cough) since your procedure? no  3.   Have you tested positive for COVID 19 since your procedure no  4.   Have you had any family members/close contacts diagnosed with the COVID 19 since your procedure?  no   If yes to any of these questions please route to Joylene John, RN and Joella Prince, RN

## 2020-06-21 ENCOUNTER — Other Ambulatory Visit: Payer: Self-pay | Admitting: Sports Medicine

## 2020-06-21 DIAGNOSIS — M5442 Lumbago with sciatica, left side: Secondary | ICD-10-CM

## 2020-06-21 MED ORDER — HYDROCODONE-IBUPROFEN 7.5-200 MG PO TABS: 1.0000 | ORAL_TABLET | Freq: Three times a day (TID) | ORAL | 0 refills | Status: DC | PRN

## 2020-07-05 ENCOUNTER — Other Ambulatory Visit: Payer: Self-pay

## 2020-07-05 ENCOUNTER — Ambulatory Visit (INDEPENDENT_AMBULATORY_CARE_PROVIDER_SITE_OTHER): Payer: BC Managed Care – PPO | Admitting: Adult Health

## 2020-07-05 ENCOUNTER — Encounter: Payer: Self-pay | Admitting: Adult Health

## 2020-07-05 VITALS — BP 150/78 | HR 82 | Temp 97.3°F | Wt 157.0 lb

## 2020-07-05 DIAGNOSIS — R350 Frequency of micturition: Secondary | ICD-10-CM | POA: Diagnosis not present
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
## 2020-07-05 NOTE — Patient Instructions (Addendum)
Overactive Bladder, Adult  Overactive bladder is a condition in which a person has a sudden and frequent need to urinate. A person might also leak urine if he or she cannot get to the bathroom fast enough (urinary incontinence). Sometimes, symptoms can interfere with work or social activities. What are the causes? Overactive bladder is associated with poor nerve signals between your bladder and your brain. Your bladder may get the signal to empty before it is full. You may also have very sensitive muscles that make your bladder squeeze too soon. This condition may also be caused by other factors, such as:  Medical conditions: ? Urinary tract infection. ? Infection of nearby tissues. ? Prostate enlargement. ? Bladder stones, inflammation, or tumors. ? Diabetes. ? Muscle or nerve weakness, especially from these conditions:  A spinal cord injury.  Stroke.  Multiple sclerosis.  Parkinson's disease.  Other causes: ? Surgery on the uterus or urethra. ? Drinking too much caffeine or alcohol. ? Certain medicines, especially those that eliminate extra fluid in the body (diuretics). ? Constipation. What increases the risk? You may be at greater risk for overactive bladder if you:  Are an older adult.  Smoke.  Are going through menopause.  Have prostate problems.  Have a neurological disease, such as stroke, dementia, Parkinson's disease, or multiple sclerosis (MS).  Eat or drink alcohol, spicy food, caffeine, and other things that irritate the bladder.  Are overweight or obese. What are the signs or symptoms? Symptoms of this condition include a sudden, strong urge to urinate. Other symptoms include:  Leaking urine.  Urinating 8 or more times a day.  Waking up to urinate 2 or more times overnight. How is this diagnosed? This condition may be diagnosed based on:  Your symptoms and medical history.  A physical exam.  Blood or urine tests to check for possible causes,  such as infection. You may also need to see a health care provider who specializes in urinary tract problems. This is called a urologist. How is this treated? Treatment for overactive bladder depends on the cause of your condition and whether it is mild or severe. Treatment may include:  Bladder training, such as: ? Learning to control the urge to urinate by following a schedule to urinate at regular intervals. ? Doing Kegel exercises to strengthen the pelvic floor muscles that support your bladder.  Special devices, such as: ? Biofeedback. This uses sensors to help you become aware of your body's signals. ? Electrical stimulation. This uses electrodes placed inside the body (implanted) or outside the body. These electrodes send gentle pulses of electricity to strengthen the nerves or muscles that control the bladder. ? Women may use a plastic device, called a pessary, that fits into the vagina and supports the bladder.  Medicines, such as: ? Antibiotics to treat bladder infection. ? Antispasmodics to stop the bladder from releasing urine at the wrong time. ? Tricyclic antidepressants to relax bladder muscles. ? Injections of botulinum toxin type A directly into the bladder tissue to relax bladder muscles.  Surgery, such as: ? A device may be implanted to help manage the nerve signals that control urination. ? An electrode may be implanted to stimulate electrical signals in the bladder. ? A procedure may be done to change the shape of the bladder. This is done only in very severe cases. Follow these instructions at home: Eating and drinking  Make diet or lifestyle changes recommended by your health care provider. These may include: ? Drinking fluids   throughout the day and not only with meals. ? Cutting down on caffeine or alcohol. ? Eating a healthy and balanced diet to prevent constipation. This may include:  Choosing foods that are high in fiber, such as beans, whole grains, and  fresh fruits and vegetables.  Limiting foods that are high in fat and processed sugars, such as fried and sweet foods.   Lifestyle  Lose weight if needed.  Do not use any products that contain nicotine or tobacco. These include cigarettes, chewing tobacco, and vaping devices, such as e-cigarettes. If you need help quitting, ask your health care provider.   General instructions  Take over-the-counter and prescription medicines only as told by your health care provider.  If you were prescribed an antibiotic medicine, take it as told by your health care provider. Do not stop taking the antibiotic even if you start to feel better.  Use any implants or pessary as told by your health care provider.  If needed, wear pads to absorb urine leakage.  Keep a log to track how much and when you drink, and when you need to urinate. This will help your health care provider monitor your condition.  Keep all follow-up visits. This is important. Contact a health care provider if:  You have a fever or chills.  Your symptoms do not get better with treatment.  Your pain and discomfort get worse.  You have more frequent urges to urinate. Get help right away if:  You are not able to control your bladder. Summary  Overactive bladder refers to a condition in which a person has a sudden and frequent need to urinate.  Several conditions may lead to an overactive bladder.  Treatment for overactive bladder depends on the cause and severity of your condition.  Making lifestyle changes, doing Kegel exercises, keeping a log, and taking medicines can help with this condition. This information is not intended to replace advice given to you by your health care provider. Make sure you discuss any questions you have with your health care provider. Document Revised: 10/17/2019 Document Reviewed: 10/17/2019 Elsevier Patient Education  2021 [employer]  

## 2020-07-07 LAB — URINALYSIS W MICROSCOPIC + REFLEX CULTURE
Bacteria, UA: NONE SEEN /HPF
Bilirubin Urine: NEGATIVE
Glucose, UA: NEGATIVE
Hgb urine dipstick: NEGATIVE
Hyaline Cast: NONE SEEN /LPF
Ketones, ur: NEGATIVE
Nitrites, Initial: NEGATIVE
Protein, ur: NEGATIVE
RBC / HPF: NONE SEEN /HPF (ref 0–2)
Specific Gravity, Urine: 1.006 (ref 1.001–1.035)
Squamous Epithelial / HPF: NONE SEEN /HPF (ref <=5)
WBC, UA: NONE SEEN /HPF (ref 0–5)
pH: 7.5 (ref 5.0–8.0)

## 2020-07-07 LAB — URINE CULTURE
MICRO NUMBER:: 11943295
SPECIMEN QUALITY:: ADEQUATE

## 2020-07-07 LAB — CULTURE INDICATED

## 2020-07-10 ENCOUNTER — Encounter: Payer: Self-pay | Admitting: Internal Medicine

## 2020-07-16 ENCOUNTER — Other Ambulatory Visit: Payer: Self-pay | Admitting: Adult Health

## 2020-07-16 DIAGNOSIS — N200 Calculus of kidney: Secondary | ICD-10-CM

## 2020-07-17 ENCOUNTER — Other Ambulatory Visit: Payer: BC Managed Care – PPO

## 2020-07-17 ENCOUNTER — Other Ambulatory Visit: Payer: Self-pay

## 2020-07-17 DIAGNOSIS — N200 Calculus of kidney: Secondary | ICD-10-CM | POA: Diagnosis not present

## 2020-07-20 ENCOUNTER — Other Ambulatory Visit: Payer: Self-pay

## 2020-07-20 DIAGNOSIS — M5442 Lumbago with sciatica, left side: Secondary | ICD-10-CM

## 2020-07-21 LAB — STONE ANALYSIS: Stone Weight: 0.095 g

## 2020-07-24 ENCOUNTER — Other Ambulatory Visit: Payer: Self-pay | Admitting: Sports Medicine

## 2020-07-24 DIAGNOSIS — M5442 Lumbago with sciatica, left side: Secondary | ICD-10-CM

## 2020-07-24 MED ORDER — HYDROCODONE-IBUPROFEN 7.5-200 MG PO TABS: 1.0000 | ORAL_TABLET | Freq: Three times a day (TID) | ORAL | 0 refills | Status: DC | PRN

## 2020-08-20 ENCOUNTER — Other Ambulatory Visit: Payer: Self-pay

## 2020-08-20 DIAGNOSIS — M5442 Lumbago with sciatica, left side: Secondary | ICD-10-CM

## 2020-08-20 NOTE — Telephone Encounter (Signed)
Spoke with patient regarding [facility] new patient appointment notification our office received. Patient states she self scheduled to consult on kidney stones. Scheduled for 08/1520. Advised patient that urology referral was recommended by PCP, patient appreciates the recommendation and will discuss further at upcoming appointment w/ Ashely Corbett. This office faxed office notes, labs, imaging, demographics, insurance card and eft voicemail for [facility] thanking for notification and to please advise of any additional information needed for this consult.

## 2020-08-21 ENCOUNTER — Ambulatory Visit: Payer: BC Managed Care – PPO | Admitting: Adult Health Nurse Practitioner

## 2020-08-21 ENCOUNTER — Ambulatory Visit: Payer: BC Managed Care – PPO | Admitting: Nurse Practitioner

## 2020-08-21 ENCOUNTER — Other Ambulatory Visit: Payer: Self-pay | Admitting: Sports Medicine

## 2020-08-21 DIAGNOSIS — M5442 Lumbago with sciatica, left side: Secondary | ICD-10-CM

## 2020-08-21 MED ORDER — HYDROCODONE-IBUPROFEN 7.5-200 MG PO TABS: 1.0000 | ORAL_TABLET | Freq: Three times a day (TID) | ORAL | 0 refills | Status: DC | PRN

## 2020-08-24 DIAGNOSIS — N200 Calculus of kidney: Secondary | ICD-10-CM | POA: Diagnosis not present

## 2020-08-24 DIAGNOSIS — I129 Hypertensive chronic kidney disease with stage 1 through stage 4 chronic kidney disease, or unspecified chronic kidney disease: Secondary | ICD-10-CM | POA: Diagnosis not present

## 2020-08-24 DIAGNOSIS — R7303 Prediabetes: Secondary | ICD-10-CM | POA: Diagnosis not present

## 2020-08-24 DIAGNOSIS — R768 Other specified abnormal immunological findings in serum: Secondary | ICD-10-CM | POA: Diagnosis not present

## 2020-08-25 DIAGNOSIS — R768 Other specified abnormal immunological findings in serum: Secondary | ICD-10-CM | POA: Diagnosis not present
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
## 2020-08-27 ENCOUNTER — Encounter: Payer: Self-pay | Admitting: Nurse Practitioner

## 2020-08-27 ENCOUNTER — Ambulatory Visit (INDEPENDENT_AMBULATORY_CARE_PROVIDER_SITE_OTHER): Payer: BC Managed Care – PPO | Admitting: Nurse Practitioner

## 2020-08-27 ENCOUNTER — Other Ambulatory Visit: Payer: Self-pay

## 2020-08-27 VITALS — BP 140/78 | HR 80 | Temp 97.5°F | Wt 159.2 lb

## 2020-08-27 DIAGNOSIS — E559 Vitamin D deficiency, unspecified: Secondary | ICD-10-CM | POA: Diagnosis not present

## 2020-08-27 DIAGNOSIS — F3342 Major depressive disorder, recurrent, in full remission: Secondary | ICD-10-CM | POA: Diagnosis not present

## 2020-08-27 DIAGNOSIS — N200 Calculus of kidney: Secondary | ICD-10-CM

## 2020-08-27 DIAGNOSIS — I10 Essential (primary) hypertension: Secondary | ICD-10-CM

## 2020-08-27 DIAGNOSIS — E782 Mixed hyperlipidemia: Secondary | ICD-10-CM | POA: Diagnosis not present

## 2020-08-27 DIAGNOSIS — L600 Ingrowing nail: Secondary | ICD-10-CM

## 2020-08-27 DIAGNOSIS — Z1389 Encounter for screening for other disorder: Secondary | ICD-10-CM

## 2020-08-27 DIAGNOSIS — K219 Gastro-esophageal reflux disease without esophagitis: Secondary | ICD-10-CM

## 2020-08-27 DIAGNOSIS — F419 Anxiety disorder, unspecified: Secondary | ICD-10-CM

## 2020-08-27 MED ORDER — DIAZEPAM 5 MG PO TABS: ORAL_TABLET | ORAL | 0 refills | Status: DC

## 2020-08-27 NOTE — Patient Instructions (Signed)
As discussed limit Valium to 1/2 tab a day for relief of muscle spasms and anxiety.  Epsom salts nightly to right foot and apply neosporin, if symptoms worsen call the office  Ingrown Toenail An ingrown toenail occurs when the corner or sides of a toenail grow into the surrounding skin. This causes discomfort and pain. The big toe is most commonly affected, but any of the toes can be affected. If an ingrown toenail is nottreated, it can become infected. What are the causes? This condition may be caused by: Wearing shoes that are too small or tight. An injury, such as stubbing your toe or having your toe stepped on. Improper cutting or care of your toenails. Having nail or foot abnormalities that were present from birth (congenital abnormalities), such as having a nail that is too big for your toe. What increases the risk? The following factors may make you more likely to develop ingrown toenails: Age. Nails tend to get thicker with age, so ingrown nails are more common among older people. Cutting your toenails incorrectly, such as cutting them very short or cutting them unevenly. An ingrown toenail is more likely to get infected if you have: Diabetes. Blood flow (circulation) problems. What are the signs or symptoms? Symptoms of an ingrown toenail may include: Pain, soreness, or tenderness. Redness. Swelling. Hardening of the skin that surrounds the toenail. Signs that an ingrown toenail may be infected include: Fluid or pus. Symptoms that get worse instead of better. How is this diagnosed? An ingrown toenail may be diagnosed based on your medical history, your symptoms, and a physical exam. If you have fluid or blood coming from your toenail, a sample may be collected to test for the specific type of bacteriathat is causing the infection. How is this treated? Treatment depends on how severe your ingrown toenail is. You may be able to care for your toenail at home. If you have an  infection, you may be prescribed antibiotic medicines. If you have fluid or pus draining from your toenail, your health care provider may drain it. If you have trouble walking, you may be given crutches to use. If you have a severe or infected ingrown toenail, you may need a procedure to remove part or all of the nail. Follow these instructions at home: Foot care  Do not pick at your toenail or try to remove it yourself. Soak your foot in warm, soapy water. Do this for 20 minutes, 3 times a day, or as often as told by your health care provider. This helps to keep your toe clean and keep your skin soft. Wear shoes that fit well and are not too tight. Your health care provider may recommend that you wear open-toed shoes while you heal. Trim your toenails regularly and carefully. Cut your toenails straight across to prevent injury to the skin at the corners of the toenail. Do not cut your nails in a curved shape. Keep your feet clean and dry to help prevent infection.  Medicines Take over-the-counter and prescription medicines only as told by your health care provider. If you were prescribed an antibiotic, take it as told by your health care provider. Do not stop taking the antibiotic even if you start to feel better. Activity Return to your normal activities as told by your health care provider. Ask your health care provider what activities are safe for you. Avoid activities that cause pain. General instructions If your health care provider told you to use crutches to help you  move around, use them as instructed. Keep all follow-up visits as told by your health care provider. This is important. Contact a health care provider if: You have more redness, swelling, pain, or other symptoms that do not improve with treatment. You have fluid, blood, or pus coming from your toenail. Get help right away if: You have a red streak on your skin that starts at your foot and spreads up your leg. You have  a fever. Summary An ingrown toenail occurs when the corner or sides of a toenail grow into the surrounding skin. This causes discomfort and pain. The big toe is most commonly affected, but any of the toes can be affected. If an ingrown toenail is not treated, it can become infected. Fluid or pus draining from your toenail is a sign of infection. Your health care provider may need to drain it. You may be given antibiotics to treat the infection. Trimming your toenails regularly and properly can help you prevent an ingrown toenail. This information is not intended to replace advice given to you by your health care provider. Make sure you discuss any questions you have with your healthcare provider. Document Revised: 05/21/2018 Document Reviewed: 10/15/2016 Elsevier Patient Education  2021 [employer].

## 2020-08-28 LAB — CBC WITH DIFFERENTIAL/PLATELET
Absolute Monocytes: 622 cells/uL (ref 200–950)
Basophils Absolute: 52 cells/uL (ref 0–200)
Basophils Relative: 0.7 %
Eosinophils Absolute: 289 cells/uL (ref 15–500)
Eosinophils Relative: 3.9 %
HCT: 38.2 % (ref 35.0–45.0)
Hemoglobin: 12.7 g/dL (ref 11.7–15.5)
Lymphs Abs: 1909 cells/uL (ref 850–3900)
MCH: 31 pg (ref 27.0–33.0)
MCHC: 33.2 g/dL (ref 32.0–36.0)
MCV: 93.2 fL (ref 80.0–100.0)
MPV: 10 fL (ref 7.5–12.5)
Monocytes Relative: 8.4 %
Neutro Abs: 4529 cells/uL (ref 1500–7800)
Neutrophils Relative %: 61.2 %
Platelets: 282 10*3/uL (ref 140–400)
RBC: 4.1 10*6/uL (ref 3.80–5.10)
RDW: 12.1 % (ref 11.0–15.0)
Total Lymphocyte: 25.8 %
WBC: 7.4 10*3/uL (ref 3.8–10.8)

## 2020-08-28 LAB — TSH: TSH: 1.51 mIU/L (ref 0.40–4.50)

## 2020-08-28 LAB — MICROALBUMIN / CREATININE URINE RATIO
Creatinine, Urine: 72 mg/dL (ref 20–275)
Microalb Creat Ratio: 3 mcg/mg creat (ref <30)
Microalb, Ur: 0.2 mg/dL

## 2020-08-28 LAB — COMPLETE METABOLIC PANEL WITH GFR
AG Ratio: 1.9 (calc) (ref 1.0–2.5)
ALT: 15 U/L (ref 6–29)
AST: 17 U/L (ref 10–35)
Albumin: 4.3 g/dL (ref 3.6–5.1)
Alkaline phosphatase (APISO): 48 U/L (ref 37–153)
BUN: 20 mg/dL (ref 7–25)
CO2: 27 mmol/L (ref 20–32)
Calcium: 10.3 mg/dL (ref 8.6–10.4)
Chloride: 107 mmol/L (ref 98–110)
Creat: 0.79 mg/dL (ref 0.50–1.05)
Globulin: 2.3 g/dL (calc) (ref 1.9–3.7)
Glucose, Bld: 88 mg/dL (ref 65–99)
Potassium: 4.5 mmol/L (ref 3.5–5.3)
Sodium: 139 mmol/L (ref 135–146)
Total Bilirubin: 0.4 mg/dL (ref 0.2–1.2)
Total Protein: 6.6 g/dL (ref 6.1–8.1)
eGFR: 84 mL/min/{1.73_m2} (ref >=60)

## 2020-08-28 LAB — VITAMIN D 25 HYDROXY (VIT D DEFICIENCY, FRACTURES): Vit D, 25-Hydroxy: 46 ng/mL (ref 30–100)

## 2020-08-28 LAB — LIPID PANEL
Cholesterol: 188 mg/dL (ref <200)
HDL: 63 mg/dL (ref >=50)
LDL Cholesterol (Calc): 108 mg/dL (calc) — ABNORMAL HIGH
Non-HDL Cholesterol (Calc): 125 mg/dL (calc) (ref <130)
Total CHOL/HDL Ratio: 3 (calc) (ref <5.0)
Triglycerides: 81 mg/dL (ref <150)

## 2020-09-19 ENCOUNTER — Other Ambulatory Visit: Payer: Self-pay

## 2020-09-19 DIAGNOSIS — M5442 Lumbago with sciatica, left side: Secondary | ICD-10-CM

## 2020-09-20 ENCOUNTER — Other Ambulatory Visit: Payer: Self-pay | Admitting: Sports Medicine

## 2020-09-20 DIAGNOSIS — M5442 Lumbago with sciatica, left side: Secondary | ICD-10-CM

## 2020-09-20 MED ORDER — HYDROCODONE-IBUPROFEN 7.5-200 MG PO TABS: 1.0000 | ORAL_TABLET | Freq: Three times a day (TID) | ORAL | 0 refills | Status: DC | PRN

## 2020-10-15 ENCOUNTER — Other Ambulatory Visit: Payer: Self-pay

## 2020-10-15 DIAGNOSIS — M5442 Lumbago with sciatica, left side: Secondary | ICD-10-CM

## 2020-10-16 DIAGNOSIS — R1012 Left upper quadrant pain: Secondary | ICD-10-CM | POA: Diagnosis not present

## 2020-10-16 DIAGNOSIS — W108XXA Fall (on) (from) other stairs and steps, initial encounter: Secondary | ICD-10-CM | POA: Diagnosis not present

## 2020-10-16 DIAGNOSIS — S2232XA Fracture of one rib, left side, initial encounter for closed fracture: Secondary | ICD-10-CM | POA: Diagnosis not present

## 2020-10-16 MED ORDER — HYDROCODONE-IBUPROFEN 7.5-200 MG PO TABS: 1.0000 | ORAL_TABLET | Freq: Three times a day (TID) | ORAL | 0 refills | Status: DC | PRN
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
## 2020-10-18 ENCOUNTER — Encounter: Payer: Self-pay | Admitting: Nurse Practitioner

## 2020-10-18 ENCOUNTER — Ambulatory Visit
Admission: RE | Admit: 2020-10-18 | Discharge: 2020-10-18 | Disposition: A | Discharge status: Home / Self Care | Payer: BC Managed Care – PPO | Source: Ambulatory Visit | Attending: Nurse Practitioner | Admitting: Nurse Practitioner

## 2020-10-18 ENCOUNTER — Ambulatory Visit (INDEPENDENT_AMBULATORY_CARE_PROVIDER_SITE_OTHER): Payer: BC Managed Care – PPO | Admitting: Nurse Practitioner

## 2020-10-18 ENCOUNTER — Other Ambulatory Visit: Payer: Self-pay

## 2020-10-18 VITALS — BP 140/80 | HR 83 | Temp 97.5°F | Wt 159.8 lb

## 2020-10-18 DIAGNOSIS — R0781 Pleurodynia: Secondary | ICD-10-CM

## 2020-10-18 NOTE — Patient Instructions (Signed)
[facility] 1. Depart and head toward [address]  197 ft 2. Turn right onto [address]  0.5 mi 3. Take the ramp on the left and follow signs for [address]  0.3 mi 4. Keep straight to get onto [address]  [place] [address]  0.9 mi 5. Arrive at [address]  The last intersection before your destination is [address]  Rib Fracture A rib fracture is a break or crack in one of the bones of the ribs. The ribs are long, curved bones that wrap around your chest and attach to your spine and your breastbone. The ribs protect your heart, lungs, and other organs in the chest. A broken or cracked rib is often painful but is not usually serious. Most rib fractures heal on their own over time. However, rib fractures can be more serious if multiple ribs are broken or if broken ribs move out of place and push against other structures or organs. What are the causes? This condition is caused by: Repetitive movements with high force, such as pitching a baseball or having very bad coughing spells. A direct hit the chest, such as a sports injury, a car crash, or a fall. Cancer that has spread to the bones, which can weaken bones and cause them to break. What are the signs or symptoms? Symptoms of this condition include: Pain when you breathe in or cough. Pain when someone presses on the injured area. Feeling short of breath. How is this diagnosed? This condition is diagnosed with a physical exam and medical history. You may also have imaging tests, such as: Chest X-ray. CT scan. MRI. Bone scan. Chest ultrasound. How is this treated? Treatment for this condition depends on the severity of the fracture. Most rib fractures usually heal on their own in 1-3 months. Healing may take longer if you have a cough or if you do activities that make the injury worse. While you heal, you may be given medicines to control the pain. You will also be taught  deep breathing exercises. Severe injuries may require hospitalization or surgery. Follow these instructions at home: Managing pain, stiffness, and swelling If directed, put ice on the injured area. To do this: Put ice in a plastic bag. Place a towel between your skin and the bag. Leave the ice on for 20 minutes, 2-3 times a day. Remove the ice if your skin turns bright red. This is very important. If you cannot feel pain, heat, or cold, you have a greater risk of damage to the area. Take over-the-counter and prescription medicines only as told by your health care provider. Activity Avoid doing activities or movements that cause pain. Be careful during activities and avoid bumping the injured rib. Slowly increase your activity as told by your health care provider. General instructions Do deep breathing exercises as told by your health care provider. This helps prevent pneumonia, which is a common complication of a broken rib. Your health care provider may instruct you to: Take deep breaths several times a day. Try to cough several times a day, holding a pillow against the injured area. Use a device called incentive spirometer to practice deep breathing several times a day. Drink enough fluid to keep your urine pale yellow. Do not wear a rib belt or binder. These restrict breathing, which can lead to pneumonia. Keep all follow-up visits. This is important. Contact a health care provider if: You have a fever. Get help right away if:  You have difficulty breathing or you are short of breath. You develop a cough that does not stop, or you cough up thick or bloody sputum. You have nausea, vomiting, or pain in your abdomen. Your pain gets worse and medicine does not help. These symptoms may represent a serious problem that is an emergency. Do not wait to see if the symptoms will go away. Get medical help right away. Call your local emergency services (911 in the [place] Do not drive yourself to  the hospital. Summary A rib fracture is a break or crack in one of the bones of the ribs. A broken or cracked rib is often painful but is not usually serious. Most rib fractures heal on their own over time. Treatment for this condition depends on the severity of the fracture. Avoid doing any activities or movements that cause pain. This information is not intended to replace advice given to you by your health care provider. Make sure you discuss any questions you have with your health care provider. Document Revised: 05/20/2019 Document Reviewed: 05/20/2019 Elsevier Patient Education  [employer].

## 2020-10-18 NOTE — Telephone Encounter (Signed)
Please advise pt we called and results are not ready.  Does she want a note to be out for tomorrow?

## 2020-11-13 ENCOUNTER — Encounter: Payer: Self-pay | Admitting: Nurse Practitioner

## 2020-11-13 ENCOUNTER — Other Ambulatory Visit: Payer: Self-pay

## 2020-11-13 ENCOUNTER — Other Ambulatory Visit: Payer: Self-pay | Admitting: Sports Medicine

## 2020-11-13 DIAGNOSIS — M5442 Lumbago with sciatica, left side: Secondary | ICD-10-CM

## 2020-11-13 DIAGNOSIS — F419 Anxiety disorder, unspecified: Secondary | ICD-10-CM

## 2020-11-13 MED ORDER — DIAZEPAM 5 MG PO TABS: ORAL_TABLET | ORAL | 0 refills | Status: DC

## 2020-11-13 MED ORDER — HYDROCODONE-IBUPROFEN 7.5-200 MG PO TABS: 1.0000 | ORAL_TABLET | Freq: Three times a day (TID) | ORAL | 0 refills | Status: DC | PRN

## 2020-11-16 NOTE — Telephone Encounter (Signed)
Can you find a letter form 2015 saying she needs an extra break and have Katrina reprint with my information and today's date

## 2020-11-19 NOTE — Telephone Encounter (Signed)
Will you do this please

## 2020-12-11 ENCOUNTER — Other Ambulatory Visit: Payer: Self-pay

## 2020-12-11 ENCOUNTER — Other Ambulatory Visit: Payer: Self-pay | Admitting: Sports Medicine

## 2020-12-11 DIAGNOSIS — M5442 Lumbago with sciatica, left side: Secondary | ICD-10-CM

## 2020-12-11 MED ORDER — HYDROCODONE-IBUPROFEN 7.5-200 MG PO TABS: 1.0000 | ORAL_TABLET | Freq: Three times a day (TID) | ORAL | 0 refills | Status: DC | PRN

## 2021-01-01 ENCOUNTER — Ambulatory Visit
Admission: RE | Admit: 2021-01-01 | Discharge: 2021-01-01 | Disposition: A | Discharge status: Home / Self Care | Payer: BC Managed Care – PPO | Source: Ambulatory Visit | Attending: Sports Medicine | Admitting: Sports Medicine

## 2021-01-01 ENCOUNTER — Ambulatory Visit: Payer: BC Managed Care – PPO | Admitting: Sports Medicine

## 2021-01-01 VITALS — BP 120/88 | Ht 66.5 in | Wt 157.0 lb

## 2021-01-01 DIAGNOSIS — M25552 Pain in left hip: Secondary | ICD-10-CM

## 2021-01-01 DIAGNOSIS — Z0001 Encounter for general adult medical examination with abnormal findings: Secondary | ICD-10-CM | POA: Diagnosis not present

## 2021-01-01 DIAGNOSIS — M25531 Pain in right wrist: Secondary | ICD-10-CM | POA: Diagnosis not present

## 2021-01-01 DIAGNOSIS — R29898 Other symptoms and signs involving the musculoskeletal system: Secondary | ICD-10-CM

## 2021-01-01 DIAGNOSIS — S93402A Sprain of unspecified ligament of left ankle, initial encounter: Secondary | ICD-10-CM

## 2021-01-01 DIAGNOSIS — G8929 Other chronic pain: Secondary | ICD-10-CM

## 2021-01-01 DIAGNOSIS — M5412 Radiculopathy, cervical region: Secondary | ICD-10-CM

## 2021-01-01 MED ORDER — DICLOFENAC SODIUM 1 % EX GEL: 4.0000 g | Freq: Four times a day (QID) | CUTANEOUS | 0 refills | Status: DC

## 2021-01-01 MED ORDER — GABAPENTIN 300 MG PO CAPS: 300.0000 mg | ORAL_CAPSULE | Freq: Three times a day (TID) | ORAL | 0 refills | Status: DC | PRN

## 2021-01-01 MED ORDER — CYCLOBENZAPRINE HCL 5 MG PO TABS: 5.0000 mg | ORAL_TABLET | Freq: Three times a day (TID) | ORAL | 0 refills | Status: DC | PRN

## 2021-01-01 NOTE — Assessment & Plan Note (Signed)
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 Given today.

## 2021-01-01 NOTE — Patient Instructions (Signed)
It was great to meet you today, thank you for letting me participate in your care!  Today, we discussed:  - Left hip: 2 hip abductor weakness.  We will give you exercises to perform at least once or twice daily for strengthening.  He may do both hips to balance them out. -Right wrist: We will obtain an x-ray of the right wrist to evaluate for any arthritic changes in the wrist.  You may use the wrist loop brace when you are working.  You may ice and/or use Voltaren gel over this area. -Left ankle: We will provide you with exercises, see attached handout to do once daily to strengthen the ankle to prevent recurrence of sprain.  You will follow-up in 6 weeks.  If you have any further questions, please give the clinic a call [phone].  [place],  Elba Barman, DO PGY-4, Sports Medicine Fellow [place]
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
## 2021-01-02 ENCOUNTER — Telehealth: Payer: Self-pay | Admitting: Sports Medicine

## 2021-01-02 NOTE — Telephone Encounter (Signed)
Called the patient, she answered and confirmed identity.  Discussed with her the essentially normal x-ray results.  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  Believe this is still due to more TFCC pathology.  We will continue with original plan discussed yesterday, and she will follow-up in 1 month.  Patient is understanding and agreeable.  Elba Barman, DO PGY-4, Sports Medicine Fellow [place]

## 2021-01-08 ENCOUNTER — Other Ambulatory Visit: Payer: Self-pay | Admitting: Sports Medicine

## 2021-01-08 DIAGNOSIS — M5442 Lumbago with sciatica, left side: Secondary | ICD-10-CM

## 2021-01-08 MED ORDER — HYDROCODONE-IBUPROFEN 7.5-200 MG PO TABS: 1.0000 | ORAL_TABLET | Freq: Three times a day (TID) | ORAL | 0 refills | Status: DC | PRN

## 2021-02-08 ENCOUNTER — Other Ambulatory Visit: Payer: Self-pay | Admitting: Sports Medicine

## 2021-02-08 DIAGNOSIS — M5442 Lumbago with sciatica, left side: Secondary | ICD-10-CM

## 2021-02-12 ENCOUNTER — Other Ambulatory Visit: Payer: Self-pay | Admitting: Sports Medicine

## 2021-02-12 DIAGNOSIS — M5442 Lumbago with sciatica, left side: Secondary | ICD-10-CM

## 2021-02-12 MED ORDER — HYDROCODONE-IBUPROFEN 7.5-200 MG PO TABS: 1.0000 | ORAL_TABLET | Freq: Three times a day (TID) | ORAL | 0 refills | Status: DC | PRN

## 2021-02-21 ENCOUNTER — Encounter: Payer: BC Managed Care – PPO | Admitting: Adult Health
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
## 2021-02-25 ENCOUNTER — Ambulatory Visit (INDEPENDENT_AMBULATORY_CARE_PROVIDER_SITE_OTHER): Payer: BC Managed Care – PPO | Admitting: Nurse Practitioner

## 2021-02-25 ENCOUNTER — Encounter: Payer: BC Managed Care – PPO | Admitting: Adult Health Nurse Practitioner

## 2021-02-25 ENCOUNTER — Other Ambulatory Visit: Payer: Self-pay

## 2021-02-25 ENCOUNTER — Encounter: Payer: Self-pay | Admitting: Nurse Practitioner

## 2021-02-25 VITALS — BP 160/88 | HR 81 | Temp 97.5°F | Ht 66.5 in | Wt 170.4 lb

## 2021-02-25 DIAGNOSIS — Z131 Encounter for screening for diabetes mellitus: Secondary | ICD-10-CM | POA: Diagnosis not present

## 2021-02-25 DIAGNOSIS — Z79899 Other long term (current) drug therapy: Secondary | ICD-10-CM

## 2021-02-25 DIAGNOSIS — Z13220 Encounter for screening for lipoid disorders: Secondary | ICD-10-CM | POA: Diagnosis not present

## 2021-02-25 DIAGNOSIS — I10 Essential (primary) hypertension: Secondary | ICD-10-CM | POA: Diagnosis not present

## 2021-02-25 DIAGNOSIS — E782 Mixed hyperlipidemia: Secondary | ICD-10-CM

## 2021-02-25 DIAGNOSIS — Z0000 Encounter for general adult medical examination without abnormal findings: Secondary | ICD-10-CM

## 2021-02-25 DIAGNOSIS — Z1389 Encounter for screening for other disorder: Secondary | ICD-10-CM

## 2021-02-25 DIAGNOSIS — Z13820 Encounter for screening for osteoporosis: Secondary | ICD-10-CM

## 2021-02-25 DIAGNOSIS — F3342 Major depressive disorder, recurrent, in full remission: Secondary | ICD-10-CM

## 2021-02-25 DIAGNOSIS — Z124 Encounter for screening for malignant neoplasm of cervix: Secondary | ICD-10-CM

## 2021-02-25 DIAGNOSIS — Z6827 Body mass index (BMI) 27.0-27.9, adult: Secondary | ICD-10-CM

## 2021-02-25 DIAGNOSIS — Z780 Asymptomatic menopausal state: Secondary | ICD-10-CM | POA: Insufficient documentation

## 2021-02-25 DIAGNOSIS — Z136 Encounter for screening for cardiovascular disorders: Secondary | ICD-10-CM | POA: Diagnosis not present

## 2021-02-25 DIAGNOSIS — Z6834 Body mass index (BMI) 34.0-34.9, adult: Secondary | ICD-10-CM

## 2021-02-25 DIAGNOSIS — Z0001 Encounter for general adult medical examination with abnormal findings: Secondary | ICD-10-CM

## 2021-02-25 DIAGNOSIS — E559 Vitamin D deficiency, unspecified: Secondary | ICD-10-CM

## 2021-02-25 DIAGNOSIS — R7309 Other abnormal glucose: Secondary | ICD-10-CM

## 2021-02-25 NOTE — Patient Instructions (Signed)
Call [facility] to schedule mammogram and DEXA- wait 24 hours from today so order gets to them    [place]   Know what a healthy weight is for you (roughly BMI <25) and aim to maintain this   Aim for 7+ servings of fruits and vegetables daily   70-80+ fluid ounces of water or unsweet tea for healthy kidneys   Limit to max 1 drink of alcohol per day; avoid smoking/tobacco   Limit animal fats in diet for cholesterol and heart health - choose grass fed whenever available   Avoid highly processed foods, and foods high in saturated/trans fats   Aim for low stress - take time to unwind and care for your mental health   Aim for 150 min of moderate intensity exercise weekly for heart health, and weights twice weekly for bone health   Aim for 7-9 hours of sleep daily

## 2021-02-26 LAB — URINALYSIS, ROUTINE W REFLEX MICROSCOPIC
Bilirubin Urine: NEGATIVE
Glucose, UA: NEGATIVE
Hgb urine dipstick: NEGATIVE
Ketones, ur: NEGATIVE
Leukocytes,Ua: NEGATIVE
Nitrite: NEGATIVE
Protein, ur: NEGATIVE
Specific Gravity, Urine: 1.005 (ref 1.001–1.035)
pH: 7 (ref 5.0–8.0)

## 2021-02-26 LAB — COMPLETE METABOLIC PANEL WITH GFR
AG Ratio: 1.7 (calc) (ref 1.0–2.5)
ALT: 18 U/L (ref 6–29)
AST: 17 U/L (ref 10–35)
Albumin: 4.5 g/dL (ref 3.6–5.1)
Alkaline phosphatase (APISO): 50 U/L (ref 37–153)
BUN: 23 mg/dL (ref 7–25)
CO2: 29 mmol/L (ref 20–32)
Calcium: 10.5 mg/dL — ABNORMAL HIGH (ref 8.6–10.4)
Chloride: 106 mmol/L (ref 98–110)
Creat: 0.81 mg/dL (ref 0.50–1.05)
Globulin: 2.6 g/dL (calc) (ref 1.9–3.7)
Glucose, Bld: 90 mg/dL (ref 65–99)
Potassium: 4.4 mmol/L (ref 3.5–5.3)
Sodium: 140 mmol/L (ref 135–146)
Total Bilirubin: 0.6 mg/dL (ref 0.2–1.2)
Total Protein: 7.1 g/dL (ref 6.1–8.1)
eGFR: 82 mL/min/{1.73_m2} (ref >=60)

## 2021-02-26 LAB — CBC WITH DIFFERENTIAL/PLATELET
Absolute Monocytes: 631 cells/uL (ref 200–950)
Basophils Absolute: 59 cells/uL (ref 0–200)
Basophils Relative: 0.9 %
Eosinophils Absolute: 429 cells/uL (ref 15–500)
Eosinophils Relative: 6.6 %
HCT: 41.9 % (ref 35.0–45.0)
Hemoglobin: 13.9 g/dL (ref 11.7–15.5)
Lymphs Abs: 2022 cells/uL (ref 850–3900)
MCH: 30.5 pg (ref 27.0–33.0)
MCHC: 33.2 g/dL (ref 32.0–36.0)
MCV: 91.9 fL (ref 80.0–100.0)
MPV: 9.4 fL (ref 7.5–12.5)
Monocytes Relative: 9.7 %
Neutro Abs: 3361 cells/uL (ref 1500–7800)
Neutrophils Relative %: 51.7 %
Platelets: 298 10*3/uL (ref 140–400)
RBC: 4.56 10*6/uL (ref 3.80–5.10)
RDW: 12 % (ref 11.0–15.0)
Total Lymphocyte: 31.1 %
WBC: 6.5 10*3/uL (ref 3.8–10.8)

## 2021-02-26 LAB — MICROALBUMIN / CREATININE URINE RATIO
Creatinine, Urine: 15 mg/dL — ABNORMAL LOW (ref 20–275)
Microalb, Ur: <0.2 mg/dL

## 2021-02-26 LAB — HEMOGLOBIN A1C
Hgb A1c MFr Bld: 5.3 % of total Hgb (ref <5.7)
Mean Plasma Glucose: 105 mg/dL
eAG (mmol/L): 5.8 mmol/L

## 2021-02-26 LAB — MAGNESIUM: Magnesium: 2.4 mg/dL (ref 1.5–2.5)

## 2021-02-26 LAB — LIPID PANEL
Cholesterol: 239 mg/dL — ABNORMAL HIGH (ref <200)
HDL: 73 mg/dL (ref >=50)
LDL Cholesterol (Calc): 141 mg/dL (calc) — ABNORMAL HIGH
Non-HDL Cholesterol (Calc): 166 mg/dL (calc) — ABNORMAL HIGH (ref <130)
Total CHOL/HDL Ratio: 3.3 (calc) (ref <5.0)
Triglycerides: 124 mg/dL (ref <150)

## 2021-02-26 LAB — TSH: TSH: 1.33 mIU/L (ref 0.40–4.50)

## 2021-02-26 LAB — VITAMIN D 25 HYDROXY (VIT D DEFICIENCY, FRACTURES): Vit D, 25-Hydroxy: 27 ng/mL — ABNORMAL LOW (ref 30–100)

## 2021-02-27 LAB — PAP, TP IMAGING W/ HPV RNA, RFLX HPV TYPE 16,18/45: HPV DNA High Risk: NOT DETECTED

## 2021-02-27 LAB — PAP, TP IMAGING, WNL RFLX HPV

## 2021-03-05 ENCOUNTER — Ambulatory Visit (INDEPENDENT_AMBULATORY_CARE_PROVIDER_SITE_OTHER): Payer: BC Managed Care – PPO | Admitting: Nurse Practitioner

## 2021-03-05 ENCOUNTER — Encounter: Payer: Self-pay | Admitting: Nurse Practitioner

## 2021-03-05 ENCOUNTER — Other Ambulatory Visit: Payer: Self-pay

## 2021-03-05 VITALS — BP 136/84 | HR 80 | Temp 97.9°F | Wt 167.8 lb

## 2021-03-05 DIAGNOSIS — R6889 Other general symptoms and signs: Secondary | ICD-10-CM | POA: Diagnosis not present

## 2021-03-05 DIAGNOSIS — J069 Acute upper respiratory infection, unspecified: Secondary | ICD-10-CM | POA: Diagnosis not present

## 2021-03-05 DIAGNOSIS — Z1152 Encounter for screening for COVID-19: Secondary | ICD-10-CM | POA: Diagnosis not present

## 2021-03-05 DIAGNOSIS — F419 Anxiety disorder, unspecified: Secondary | ICD-10-CM | POA: Diagnosis not present

## 2021-03-05 LAB — POC COVID19 BINAXNOW: SARS Coronavirus 2 Ag: NEGATIVE

## 2021-03-05 LAB — POCT INFLUENZA A/B
Influenza A, POC: NEGATIVE
Influenza B, POC: NEGATIVE

## 2021-03-05 MED ORDER — PROMETHAZINE-DM 6.25-15 MG/5ML PO SYRP: 5.0000 mL | ORAL_SOLUTION | Freq: Four times a day (QID) | ORAL | 1 refills | Status: DC | PRN

## 2021-03-05 MED ORDER — DEXAMETHASONE 1 MG PO TABS: ORAL_TABLET | ORAL | 0 refills | Status: DC

## 2021-03-05 MED ORDER — BENZONATATE 100 MG PO CAPS: 100.0000 mg | ORAL_CAPSULE | Freq: Four times a day (QID) | ORAL | 1 refills | Status: DC | PRN

## 2021-03-05 MED ORDER — DIAZEPAM 5 MG PO TABS: ORAL_TABLET | ORAL | 0 refills | Status: DC

## 2021-03-05 MED ORDER — AZITHROMYCIN 250 MG PO TABS: ORAL_TABLET | ORAL | 1 refills | Status: DC

## 2021-03-05 MED ORDER — GUAIFENESIN ER 1200 MG PO TB12: 1.0000 | ORAL_TABLET | Freq: Two times a day (BID) | ORAL | 0 refills | Status: DC
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
## 2021-03-05 NOTE — Patient Instructions (Signed)

## 2021-03-11 ENCOUNTER — Encounter: Payer: Self-pay | Admitting: Internal Medicine

## 2021-03-13 ENCOUNTER — Telehealth: Payer: Self-pay | Admitting: Internal Medicine

## 2021-03-13 NOTE — Telephone Encounter (Signed)
Patient called and stated that she needs prior authorization for Protonix. Patient is requesting a call back. Please advise.

## 2021-03-15 ENCOUNTER — Telehealth: Payer: Self-pay

## 2021-03-15 MED ORDER — PANTOPRAZOLE SODIUM 40 MG PO TBEC: 40.0000 mg | DELAYED_RELEASE_TABLET | Freq: Every day | ORAL | 3 refills | Status: DC

## 2021-03-15 NOTE — Telephone Encounter (Signed)
PA approved - rx resent to pharmacy

## 2021-03-15 NOTE — Telephone Encounter (Signed)
Prior Authorization for Protonix submitted through Cover My Meds. Awaiting response

## 2021-03-15 NOTE — Telephone Encounter (Signed)
Pantoprazole approved through Cover My Meds.  Resent to pharmacy with this information.

## 2021-03-18 ENCOUNTER — Other Ambulatory Visit: Payer: Self-pay | Admitting: Sports Medicine

## 2021-03-18 DIAGNOSIS — M5442 Lumbago with sciatica, left side: Secondary | ICD-10-CM

## 2021-03-19 ENCOUNTER — Other Ambulatory Visit: Payer: Self-pay | Admitting: Sports Medicine

## 2021-03-19 DIAGNOSIS — M5442 Lumbago with sciatica, left side: Secondary | ICD-10-CM

## 2021-03-19 MED ORDER — HYDROCODONE-IBUPROFEN 7.5-200 MG PO TABS: 1.0000 | ORAL_TABLET | Freq: Three times a day (TID) | ORAL | 0 refills | Status: DC | PRN

## 2021-03-25 DIAGNOSIS — I129 Hypertensive chronic kidney disease with stage 1 through stage 4 chronic kidney disease, or unspecified chronic kidney disease: Secondary | ICD-10-CM | POA: Diagnosis not present

## 2021-03-25 DIAGNOSIS — R7303 Prediabetes: Secondary | ICD-10-CM | POA: Diagnosis not present

## 2021-03-25 DIAGNOSIS — N200 Calculus of kidney: Secondary | ICD-10-CM | POA: Diagnosis not present
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
## 2021-03-26 ENCOUNTER — Encounter: Payer: Self-pay | Admitting: Physician Assistant

## 2021-03-26 ENCOUNTER — Ambulatory Visit (INDEPENDENT_AMBULATORY_CARE_PROVIDER_SITE_OTHER): Payer: BC Managed Care – PPO | Admitting: Physician Assistant

## 2021-03-26 VITALS — BP 130/86 | HR 76 | Ht 66.5 in | Wt 168.4 lb

## 2021-03-26 DIAGNOSIS — K219 Gastro-esophageal reflux disease without esophagitis: Secondary | ICD-10-CM | POA: Diagnosis not present

## 2021-03-26 NOTE — Progress Notes (Signed)
Assessment and plan reviewed 

## 2021-03-26 NOTE — Patient Instructions (Addendum)
If you are age 64 or younger, your body mass index should be between 19-25. Your Body mass index is 26.77 kg/m. If this is out of the aformentioned range listed, please consider follow up with your Primary Care Provider.  ________________________________________________________  The [facility] providers would like to encourage you to use [facility] to communicate with providers for non-urgent requests or questions.  Due to long hold times on the telephone, sending your provider a message by [facility] may be a faster and more efficient way to get a response.  Please allow 48 business hours for a response.  Please remember that this is for non-urgent requests.  _______________________________________________________  Gabriela [name] have been scheduled to follow up May 27, 2021 at 10:00 am  Thank you for entrusting me with your care and choosing [facility].  Vicie Mutters, PA-C   Please take your proton pump inhibitor medication 30 minutes to 1 hour before meals- this makes it more effective- as long as you are on  DO 1 IN THE AM AND 1 AT NIGHT FOR 2 WEEKS, THEN JUST DO 1 AT NIGHT BEFORE A MEAL AND FOLLOW UP.  Avoid spicy and acidic foods Avoid fatty foods Limit your intake of coffee, tea, alcohol, and carbonated drinks Work to maintain a healthy weight Keep the head of the bed elevated at least 3 inches with blocks or a wedge pillow if you are having any nighttime symptoms Stay upright for 2 hours after eating Avoid meals and snacks three to four hours before bedtime Stop smoking  Silent reflux: Not all heartburn burns...Marland KitchenMarland KitchenMarland Kitchen  What is LPR? Laryngopharyngeal reflux (LPR) or silent reflux is a condition in which acid that is made in the stomach travels up the esophagus (swallowing tube) and gets to the throat. Not everyone with reflux has a lot of heartburn or indigestion. In fact, many people with LPR never have heartburn. This is why LPR is called SILENT REFLUX, and the terms "Silent reflux"  and "LPR" are often used interchangeably. Because LPR is silent, it is sometimes difficult to diagnose.  How can you tell if you have LPR?  Chronic hoarseness- Some people have hoarseness that comes and goes throat clearing  Cough It can cause shortness of breath and cause asthma like symptoms. a feeling of a lump in the throat  difficulty swallowing a problem with too much nose and throat drainage.  Some people will feel their esophagus spasm which feels like their heart beating hard and fast, this will usually be after a meal, at rest, or lying down at night.    How do I treat this? Treatment for LPR should be individualized, and your doctor will suggest the best treatment for you. Generally there are several treatments for LPR: changing habits and diet to reduce reflux,  medications to reduce stomach acid, and  surgery to prevent reflux. Most people with LPR need to modify how and when they eat, as well as take some medication, to get well. Sometimes, nonprescription liquid antacids, such as Maalox, Gelucil and Mylanta are recommended. When used, these antacids should be taken four times each day - one tablespoon one hour after each meal and before bedtime. Dietary and lifestyle changes alone are not often enough to control LPR - medications that reduce stomach acid are also usually needed. These must be prescribed by our doctor.   TIPS FOR REDUCING REFLUX AND LPR Control your LIFE-STYLE and your DIET! If you use tobacco, QUIT.  Smoking makes you reflux. After every  cigarette you have some LPR.  Don't wear clothing that is too tight, especially around the waist (trousers, corsets, belts).  Do not lie down just after eating...in fact, do not eat within three hours of bedtime.  You should be on a low-fat diet.  Limit your intake of red meat.  Limit your intake of butter.  Avoid fried foods.  Avoid chocolate  Avoid cheese.  Avoid eggs. Specifically avoid caffeine (especially  coffee and tea), soda pop (especially cola) and mints.  Avoid alcoholic beverages, particularly in the evening.  Gastroparesis Please do small frequent meals like 4-6 meals a day.  Eat and drink liquids at separate times.  Avoid high fiber foods, cook your vegetables, avoid high fat food.  Suggest spreading protein throughout the day (greek yogurt, glucerna, soft meat, milk, eggs) Choose soft foods that you can mash with a fork When you are more symptomatic, change to pureed foods foods and liquids.  Consider reading "Living well with Gastroparesis" by Lambert Keto Gastroparesis is a condition in which food takes longer than normal to empty from the stomach. This condition is also known as delayed gastric emptying. It is usually a long-term (chronic) condition. There is no cure, but there are treatments and things that you can do at home to help relieve symptoms. Treating the underlying condition that causes gastroparesis can also help relieve symptoms What are the causes? In many cases, the cause of this condition is not known. Possible causes include: A hormone (endocrine) disorder, such as hypothyroidism or diabetes. A nervous system disease, such as Parkinson's disease or multiple sclerosis. Cancer, infection, or surgery that affects the stomach or vagus nerve. The vagus nerve runs from your chest, through your neck, and to the lower part of your brain. A connective tissue disorder, such as scleroderma. Certain medicines. What increases the risk? You are more likely to develop this condition if: You have certain disorders or diseases. These may include: An endocrine disorder. An eating disorder. Amyloidosis. Scleroderma. Parkinson's disease. Multiple sclerosis. Cancer or infection of the stomach or the vagus nerve. You have had surgery on your stomach or vagus nerve. You take certain medicines. You are female. What are the signs or symptoms? Symptoms of this condition  include: Feeling full after eating very little or a loss of appetite. Nausea, vomiting, or heartburn. Bloating of your abdomen. Inconsistent blood sugar (glucose) levels on blood tests. Unexplained weight loss. Acid from the stomach coming up into the esophagus (gastroesophageal reflux). Sudden tightening (spasm) of the stomach, which can be painful. Symptoms may come and go. Some people may not notice any symptoms. How is this diagnosed? This condition is diagnosed with tests, such as: Tests that check how long it takes food to move through the stomach and intestines. These tests include: Upper gastrointestinal (GI) series. For this test, you drink a liquid that shows up well on X-rays, and then X-rays are taken of your intestines. Gastric emptying scintigraphy. For this test, you eat food that contains a small amount of radioactive material, and then scans are taken. Wireless capsule GI monitoring system. For this test, you swallow a pill (capsule) that records information about how foods and fluid move through your stomach. Gastric manometry. For this test, a tube is passed down your throat and into your stomach to measure electrical and muscular activity. Endoscopy. For this test, a long, thin tube with a camera and light on the end is passed down your throat and into your stomach to check for problems  in your stomach lining. Ultrasound. This test uses sound waves to create images of the inside of your body. This can help rule out gallbladder disease or pancreatitis as a cause of your symptoms. How is this treated? There is no cure for this condition, but treatment and home care may relieve symptoms. Treatment may include: Treating the underlying cause. Managing your symptoms by making changes to your diet and exercise habits. Taking medicines to control nausea and vomiting and to stimulate stomach muscles. Getting food through a feeding tube in the hospital. This may be done in severe  cases. Having surgery to insert a device called a gastric electrical stimulator into your body. This device helps improve stomach emptying and control nausea and vomiting. Follow these instructions at home: Take over-the-counter and prescription medicines only as told by your health care provider. Follow instructions from your health care provider about eating or drinking restrictions. Your health care provider may recommend that you: Eat smaller meals more often. Eat low-fat foods. Eat low-fiber forms of high-fiber foods. For example, eat cooked vegetables instead of raw vegetables. Have only liquid foods instead of solid foods. Liquid foods are easier to digest. Drink enough fluid to keep your urine pale yellow. Exercise as often as told by your health care provider. Keep all follow-up visits. This is important. Contact a health care provider if you: Notice that your symptoms do not improve with treatment. Have new symptoms. Get help right away if you: Have severe pain in your abdomen that does not improve with treatment. Have nausea that is severe or does not go away. Vomit every time you drink fluids. Summary Gastroparesis is a long-term (chronic) condition in which food takes longer than normal to empty from the stomach. Symptoms include nausea, vomiting, heartburn, bloating of your abdomen, and loss of appetite. Eating smaller portions, low-fat foods, and low-fiber forms of high-fiber foods may help you manage your symptoms. Get help right away if you have severe pain in your abdomen. This information is not intended to replace advice given to you by your health care provider. Make sure you discuss any questions you have with your health care provider. Document Revised: 06/06/2019 Document Reviewed: 06/06/2019 Elsevier Patient Education  2021 [employer].

## 2021-03-26 NOTE — Progress Notes (Signed)
Assessment and plan noted ?

## 2021-04-09 ENCOUNTER — Other Ambulatory Visit (HOSPITAL_COMMUNITY): Payer: Self-pay | Admitting: Nephrology

## 2021-04-09 ENCOUNTER — Other Ambulatory Visit: Payer: Self-pay | Admitting: Nephrology

## 2021-04-09 DIAGNOSIS — D351 Benign neoplasm of parathyroid gland: Secondary | ICD-10-CM

## 2021-04-09 DIAGNOSIS — M8589 Other specified disorders of bone density and structure, multiple sites: Secondary | ICD-10-CM | POA: Diagnosis not present

## 2021-04-09 DIAGNOSIS — Z1231 Encounter for screening mammogram for malignant neoplasm of breast: Secondary | ICD-10-CM | POA: Diagnosis not present

## 2021-04-09 LAB — HM DEXA SCAN

## 2021-04-09 LAB — HM MAMMOGRAPHY

## 2021-04-10 ENCOUNTER — Encounter: Payer: Self-pay | Admitting: Internal Medicine

## 2021-04-11 ENCOUNTER — Encounter: Payer: Self-pay | Admitting: Internal Medicine

## 2021-04-11 ENCOUNTER — Encounter: Payer: Self-pay | Admitting: Sports Medicine

## 2021-04-19 ENCOUNTER — Other Ambulatory Visit: Payer: Self-pay | Admitting: Sports Medicine

## 2021-04-19 DIAGNOSIS — M5442 Lumbago with sciatica, left side: Secondary | ICD-10-CM

## 2021-04-23 ENCOUNTER — Other Ambulatory Visit: Payer: Self-pay | Admitting: Sports Medicine

## 2021-04-23 DIAGNOSIS — M5442 Lumbago with sciatica, left side: Secondary | ICD-10-CM

## 2021-04-23 MED ORDER — HYDROCODONE-IBUPROFEN 7.5-200 MG PO TABS: 1.0000 | ORAL_TABLET | Freq: Three times a day (TID) | ORAL | 0 refills | Status: DC | PRN

## 2021-05-10 ENCOUNTER — Other Ambulatory Visit: Payer: Self-pay | Admitting: Nurse Practitioner

## 2021-05-10 DIAGNOSIS — F419 Anxiety disorder, unspecified: Secondary | ICD-10-CM

## 2021-05-10 MED ORDER — DIAZEPAM 5 MG PO TABS: ORAL_TABLET | ORAL | 0 refills | Status: DC

## 2021-05-19 ENCOUNTER — Other Ambulatory Visit: Payer: Self-pay | Admitting: Sports Medicine

## 2021-05-19 DIAGNOSIS — M5442 Lumbago with sciatica, left side: Secondary | ICD-10-CM

## 2021-05-21 ENCOUNTER — Other Ambulatory Visit: Payer: Self-pay | Admitting: Sports Medicine

## 2021-05-21 DIAGNOSIS — M5442 Lumbago with sciatica, left side: Secondary | ICD-10-CM

## 2021-05-21 MED ORDER — HYDROCODONE-IBUPROFEN 7.5-200 MG PO TABS: 1.0000 | ORAL_TABLET | Freq: Three times a day (TID) | ORAL | 0 refills | Status: DC | PRN

## 2021-05-22 ENCOUNTER — Other Ambulatory Visit: Payer: Self-pay | Admitting: Sports Medicine

## 2021-05-22 DIAGNOSIS — M5442 Lumbago with sciatica, left side: Secondary | ICD-10-CM

## 2021-05-24 ENCOUNTER — Telehealth: Payer: Self-pay | Admitting: Physician Assistant

## 2021-05-24 NOTE — Telephone Encounter (Signed)
Attempted to call the patient, left a message and sent the patient a message via mychart.  ?

## 2021-05-24 NOTE — Telephone Encounter (Signed)
Patient returned your phone call and wanted to let you know she is at home now and you can reach her at [phone].  Thank you. ?

## 2021-05-24 NOTE — Telephone Encounter (Signed)
Patient called requesting to speak with Vicie Mutters. Per patient, at her last OV was told to follow up with via mychart, but Vicie Mutters is not showing up as a provider so patient is unable to message. Patient states she prefers an email: [email or web address]. please advise.  ?

## 2021-05-24 NOTE — Telephone Encounter (Signed)
Patient would like to speak with Estill Bamberg, [place] in regards to further testing that she had done. She said she was told at last OV to give Estill Bamberg an update, however her MyChart will not let her select her as a provider. Patient would not disclose any further information. Will route to PA.  ?

## 2021-05-24 NOTE — Telephone Encounter (Signed)
Left message for patient to call back  

## 2021-05-27 ENCOUNTER — Ambulatory Visit: Payer: BC Managed Care – PPO | Admitting: Internal Medicine

## 2021-06-04 ENCOUNTER — Ambulatory Visit: Payer: Self-pay | Admitting: Nurse Practitioner

## 2021-06-21 ENCOUNTER — Other Ambulatory Visit: Payer: Self-pay | Admitting: Sports Medicine

## 2021-06-21 DIAGNOSIS — M5442 Lumbago with sciatica, left side: Secondary | ICD-10-CM

## 2021-06-25 ENCOUNTER — Other Ambulatory Visit: Payer: Self-pay | Admitting: Sports Medicine

## 2021-06-25 DIAGNOSIS — M5442 Lumbago with sciatica, left side: Secondary | ICD-10-CM

## 2021-06-25 MED ORDER — HYDROCODONE-IBUPROFEN 7.5-200 MG PO TABS: 1.0000 | ORAL_TABLET | Freq: Three times a day (TID) | ORAL | 0 refills | Status: DC | PRN

## 2021-06-27 ENCOUNTER — Ambulatory Visit: Payer: BC Managed Care – PPO | Admitting: Sports Medicine

## 2021-06-27 VITALS — BP 162/96 | Ht 65.5 in | Wt 171.0 lb

## 2021-06-27 DIAGNOSIS — M25511 Pain in right shoulder: Secondary | ICD-10-CM | POA: Diagnosis not present

## 2021-06-27 DIAGNOSIS — M75102 Unspecified rotator cuff tear or rupture of left shoulder, not specified as traumatic: Secondary | ICD-10-CM

## 2021-06-27 DIAGNOSIS — M12812 Other specific arthropathies, not elsewhere classified, left shoulder: Secondary | ICD-10-CM

## 2021-06-27 DIAGNOSIS — M67922 Unspecified disorder of synovium and tendon, left upper arm: Secondary | ICD-10-CM | POA: Diagnosis not present

## 2021-06-27 DIAGNOSIS — M25512 Pain in left shoulder: Secondary | ICD-10-CM | POA: Diagnosis not present

## 2021-06-27 DIAGNOSIS — G8929 Other chronic pain: Secondary | ICD-10-CM | POA: Diagnosis not present

## 2021-06-27 NOTE — Patient Instructions (Addendum)
We are going to refer you to Dr. Mardelle Matte for your shoulder pain.  [place]  [phone]  Appt: Friday 06/28/21 @ 9:30 am

## 2021-06-27 NOTE — Assessment & Plan Note (Signed)
Still has some proximal tendon swelling Popeye deformity on exam However her strength has improved and she has less pain
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
## 2021-06-27 NOTE — Assessment & Plan Note (Signed)
This tear has probably recurred and appears to be full thickness with retraction.  Greater changes than noted in 2021.  Will send for surgical consult

## 2021-06-28 ENCOUNTER — Encounter (HOSPITAL_COMMUNITY)
Admission: RE | Admit: 2021-06-28 | Discharge: 2021-06-28 | Disposition: A | Discharge status: Home / Self Care | Payer: BC Managed Care – PPO | Source: Ambulatory Visit | Attending: Nephrology | Admitting: Nephrology

## 2021-06-28 DIAGNOSIS — D351 Benign neoplasm of parathyroid gland: Secondary | ICD-10-CM | POA: Diagnosis not present

## 2021-06-28 DIAGNOSIS — S46012A Strain of muscle(s) and tendon(s) of the rotator cuff of left shoulder, initial encounter: Secondary | ICD-10-CM | POA: Diagnosis not present

## 2021-06-28 DIAGNOSIS — S46011A Strain of muscle(s) and tendon(s) of the rotator cuff of right shoulder, initial encounter: Secondary | ICD-10-CM | POA: Diagnosis not present

## 2021-06-28 MED ORDER — TECHNETIUM TC 99M SESTAMIBI GENERIC - CARDIOLITE
26.0000 | Freq: Once | INTRAVENOUS | Status: AC | PRN
  Administered 2021-06-28: 26 via INTRAVENOUS

## 2021-07-10 DIAGNOSIS — I129 Hypertensive chronic kidney disease with stage 1 through stage 4 chronic kidney disease, or unspecified chronic kidney disease: Secondary | ICD-10-CM | POA: Diagnosis not present

## 2021-07-10 DIAGNOSIS — D351 Benign neoplasm of parathyroid gland: Secondary | ICD-10-CM | POA: Diagnosis not present

## 2021-07-10 DIAGNOSIS — N200 Calculus of kidney: Secondary | ICD-10-CM | POA: Diagnosis not present

## 2021-07-10 DIAGNOSIS — M25512 Pain in left shoulder: Secondary | ICD-10-CM | POA: Diagnosis not present

## 2021-07-10 DIAGNOSIS — E559 Vitamin D deficiency, unspecified: Secondary | ICD-10-CM | POA: Diagnosis not present

## 2021-07-10 DIAGNOSIS — N182 Chronic kidney disease, stage 2 (mild): Secondary | ICD-10-CM | POA: Diagnosis not present

## 2021-07-12 DIAGNOSIS — M25512 Pain in left shoulder: Secondary | ICD-10-CM | POA: Diagnosis not present
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
## 2021-07-17 ENCOUNTER — Ambulatory Visit: Payer: BC Managed Care – PPO | Attending: Sports Medicine

## 2021-07-17 DIAGNOSIS — M25512 Pain in left shoulder: Secondary | ICD-10-CM | POA: Diagnosis not present

## 2021-07-17 DIAGNOSIS — M6281 Muscle weakness (generalized): Secondary | ICD-10-CM | POA: Insufficient documentation

## 2021-07-17 DIAGNOSIS — M25511 Pain in right shoulder: Secondary | ICD-10-CM | POA: Insufficient documentation

## 2021-07-17 DIAGNOSIS — G8929 Other chronic pain: Secondary | ICD-10-CM | POA: Diagnosis not present

## 2021-07-23 ENCOUNTER — Other Ambulatory Visit: Payer: Self-pay | Admitting: Sports Medicine

## 2021-07-23 DIAGNOSIS — M5442 Lumbago with sciatica, left side: Secondary | ICD-10-CM

## 2021-07-25 ENCOUNTER — Encounter: Payer: Self-pay | Admitting: Internal Medicine

## 2021-07-25 ENCOUNTER — Other Ambulatory Visit: Payer: Self-pay | Admitting: Sports Medicine

## 2021-07-25 DIAGNOSIS — M5442 Lumbago with sciatica, left side: Secondary | ICD-10-CM

## 2021-07-25 MED ORDER — HYDROCODONE-IBUPROFEN 7.5-200 MG PO TABS: 1.0000 | ORAL_TABLET | Freq: Three times a day (TID) | ORAL | 0 refills | Status: DC | PRN
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
## 2021-07-26 ENCOUNTER — Ambulatory Visit: Payer: BC Managed Care – PPO

## 2021-07-26 DIAGNOSIS — M25512 Pain in left shoulder: Secondary | ICD-10-CM | POA: Diagnosis not present

## 2021-07-26 DIAGNOSIS — M25511 Pain in right shoulder: Secondary | ICD-10-CM | POA: Diagnosis not present

## 2021-07-26 DIAGNOSIS — G8929 Other chronic pain: Secondary | ICD-10-CM

## 2021-07-26 DIAGNOSIS — M6281 Muscle weakness (generalized): Secondary | ICD-10-CM

## 2021-07-29 ENCOUNTER — Ambulatory Visit: Payer: BC Managed Care – PPO

## 2021-07-30 ENCOUNTER — Ambulatory Visit: Payer: Self-pay | Admitting: Nurse Practitioner

## 2021-07-30 ENCOUNTER — Other Ambulatory Visit: Payer: Self-pay | Admitting: Sports Medicine

## 2021-07-30 DIAGNOSIS — M5442 Lumbago with sciatica, left side: Secondary | ICD-10-CM

## 2021-07-30 MED ORDER — HYDROCODONE-IBUPROFEN 7.5-200 MG PO TABS: 1.0000 | ORAL_TABLET | Freq: Three times a day (TID) | ORAL | 0 refills | Status: DC | PRN

## 2021-07-31 ENCOUNTER — Ambulatory Visit: Payer: BC Managed Care – PPO

## 2021-07-31 DIAGNOSIS — M6281 Muscle weakness (generalized): Secondary | ICD-10-CM

## 2021-07-31 DIAGNOSIS — M25512 Pain in left shoulder: Secondary | ICD-10-CM | POA: Diagnosis not present

## 2021-07-31 DIAGNOSIS — M25511 Pain in right shoulder: Secondary | ICD-10-CM | POA: Diagnosis not present

## 2021-07-31 DIAGNOSIS — G8929 Other chronic pain: Secondary | ICD-10-CM
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
## 2021-08-05 ENCOUNTER — Ambulatory Visit: Payer: BC Managed Care – PPO

## 2021-08-05 DIAGNOSIS — M6281 Muscle weakness (generalized): Secondary | ICD-10-CM | POA: Diagnosis not present

## 2021-08-05 DIAGNOSIS — G8929 Other chronic pain: Secondary | ICD-10-CM | POA: Diagnosis not present

## 2021-08-05 DIAGNOSIS — M25512 Pain in left shoulder: Secondary | ICD-10-CM | POA: Diagnosis not present

## 2021-08-05 DIAGNOSIS — M25511 Pain in right shoulder: Secondary | ICD-10-CM | POA: Diagnosis not present

## 2021-08-07 ENCOUNTER — Ambulatory Visit: Payer: BC Managed Care – PPO

## 2021-08-07 DIAGNOSIS — G8929 Other chronic pain: Secondary | ICD-10-CM

## 2021-08-07 DIAGNOSIS — M25511 Pain in right shoulder: Secondary | ICD-10-CM | POA: Diagnosis not present

## 2021-08-07 DIAGNOSIS — M6281 Muscle weakness (generalized): Secondary | ICD-10-CM

## 2021-08-07 DIAGNOSIS — M25512 Pain in left shoulder: Secondary | ICD-10-CM | POA: Diagnosis not present
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
## 2021-08-08 ENCOUNTER — Ambulatory Visit (INDEPENDENT_AMBULATORY_CARE_PROVIDER_SITE_OTHER): Payer: BC Managed Care – PPO | Admitting: Nurse Practitioner

## 2021-08-08 ENCOUNTER — Encounter: Payer: Self-pay | Admitting: Nurse Practitioner

## 2021-08-08 VITALS — BP 150/92 | HR 84 | Temp 97.7°F | Wt 177.0 lb

## 2021-08-08 DIAGNOSIS — F3342 Major depressive disorder, recurrent, in full remission: Secondary | ICD-10-CM | POA: Diagnosis not present

## 2021-08-08 DIAGNOSIS — F419 Anxiety disorder, unspecified: Secondary | ICD-10-CM

## 2021-08-08 DIAGNOSIS — E782 Mixed hyperlipidemia: Secondary | ICD-10-CM | POA: Diagnosis not present

## 2021-08-08 DIAGNOSIS — E559 Vitamin D deficiency, unspecified: Secondary | ICD-10-CM | POA: Diagnosis not present

## 2021-08-08 DIAGNOSIS — K219 Gastro-esophageal reflux disease without esophagitis: Secondary | ICD-10-CM

## 2021-08-08 DIAGNOSIS — I10 Essential (primary) hypertension: Secondary | ICD-10-CM | POA: Diagnosis not present

## 2021-08-08 MED ORDER — LOSARTAN POTASSIUM 50 MG PO TABS: 50.0000 mg | ORAL_TABLET | Freq: Every day | ORAL | 11 refills | Status: DC

## 2021-08-08 NOTE — Patient Instructions (Signed)
Losartan Tablets What is this medication? LOSARTAN (loe SAR tan) treats high blood pressure. It may also be used to prevent a stroke in people with heart disease and high blood pressure. It can be used to prevent kidney damage in people with diabetes. It works by relaxing the blood vessels, which helps decrease the amount of work your heart has to do. It belongs to a group of medications called ARBs. This medicine may be used for other purposes; ask your health care provider or pharmacist if you have questions. COMMON BRAND NAME(S): Cozaar What should I tell my care team before I take this medication? They need to know if you have any of these conditions: Heart failure Kidney disease Liver disease An unusual or allergic reaction to losartan, other medications, foods, dyes, or preservatives Pregnant or trying to get pregnant Breast-feeding How should I use this medication? Take this medication by mouth. Take it as directed on the prescription label at the same time every day. You can take it with or without food. If it upsets your stomach, take it with food. Keep taking it unless your care team tells you to stop. Talk to your care team about the use of this medication in children. While it may be prescribed for children as young as 6 for selected conditions, precautions do apply. Overdosage: If you think you have taken too much of this medicine contact a poison control center or emergency room at once. NOTE: This medicine is only for you. Do not share this medicine with others. What if I miss a dose? If you miss a dose, take it as soon as you can. If it is almost time for your next dose, take only that dose. Do not take double or extra doses. What may interact with this medication? Aliskiren ACE inhibitors, like enalapril or lisinopril Diuretics, especially amiloride, eplerenone, spironolactone, or triamterene Lithium NSAIDs, medications for pain and inflammation, like ibuprofen or  naproxen Potassium salts or potassium supplements This list may not describe all possible interactions. Give your health care provider a list of all the medicines, herbs, non-prescription drugs, or dietary supplements you use. Also tell them if you smoke, drink alcohol, or use illegal drugs. Some items may interact with your medicine. What should I watch for while using this medication? Visit your care team for regular check ups. Check your blood pressure as directed. Ask your care team what your blood pressure should be. Also, find out when you should contact them. Do not treat yourself for coughs, colds, or pain while you are using this medication without asking your care team for advice. Some medications may increase your blood pressure. Women should inform their care team if they wish to become pregnant or think they might be pregnant. There is a potential for serious side effects to an unborn child. Talk to your care team for more information. You may get drowsy or dizzy. Do not drive, use machinery, or do anything that needs mental alertness until you know how this medication affects you. Do not stand or sit up quickly, especially if you are an older patient. This reduces the risk of dizzy or fainting spells. Alcohol can make you more drowsy and dizzy. Avoid alcoholic drinks. Avoid salt substitutes unless you are told otherwise by your care team. What side effects may I notice from receiving this medication? Side effects that you should report to your care team as soon as possible: Allergic reactions--skin rash, itching, hives, swelling of the face, lips, tongue, or  throat High potassium level--muscle weakness, fast or irregular heartbeat Kidney injury--decrease in the amount of urine, swelling of the ankles, hands, or feet Low blood pressure--dizziness, feeling faint or lightheaded, blurry vision Side effects that usually do not require medical attention (report to your care team if they  continue or are bothersome): Dizziness Headache Runny or stuffy nose This list may not describe all possible side effects. Call your doctor for medical advice about side effects. You may report side effects to FDA at 1-800-FDA-1088. Where should I keep my medication? Keep out of the reach of children and pets. Store at room temperature between 20 and 25 degrees C (68 and 77 degrees F). Protect from light. Keep the container tightly closed. Get rid of any unused medication after the expiration date. To get rid of medications that are no longer needed or have expired: Take the medication to a medication take-back program. Check with your pharmacy or law enforcement to find a location. If you cannot return the medication, check the label or package insert to see if the medication should be thrown out in the garbage or flushed down the toilet. If you are not sure, ask your care team. If it is safe to put in the trash, empty the medication out of the container. Mix the medication with cat litter, dirt, coffee grounds, or other unwanted substance. Seal the mixture in a bag or container. Put it in the trash. NOTE: This sheet is a summary. It may not cover all possible information. If you have questions about this medicine, talk to your doctor, pharmacist, or health care provider.  2023 Elsevier/Gold Standard (2020-12-28 00:00:00)

## 2021-08-09 ENCOUNTER — Other Ambulatory Visit: Payer: Self-pay | Admitting: Nurse Practitioner

## 2021-08-09 LAB — CBC WITH DIFFERENTIAL/PLATELET
Absolute Monocytes: 663 cells/uL (ref 200–950)
Basophils Absolute: 52 cells/uL (ref 0–200)
Basophils Relative: 0.8 %
Eosinophils Absolute: 403 cells/uL (ref 15–500)
Eosinophils Relative: 6.2 %
HCT: 42.1 % (ref 35.0–45.0)
Hemoglobin: 14 g/dL (ref 11.7–15.5)
Lymphs Abs: 1853 cells/uL (ref 850–3900)
MCH: 30.4 pg (ref 27.0–33.0)
MCHC: 33.3 g/dL (ref 32.0–36.0)
MCV: 91.3 fL (ref 80.0–100.0)
MPV: 9.6 fL (ref 7.5–12.5)
Monocytes Relative: 10.2 %
Neutro Abs: 3530 cells/uL (ref 1500–7800)
Neutrophils Relative %: 54.3 %
Platelets: 308 10*3/uL (ref 140–400)
RBC: 4.61 10*6/uL (ref 3.80–5.10)
RDW: 11.9 % (ref 11.0–15.0)
Total Lymphocyte: 28.5 %
WBC: 6.5 10*3/uL (ref 3.8–10.8)

## 2021-08-09 LAB — COMPLETE METABOLIC PANEL WITH GFR
AG Ratio: 1.7 (calc) (ref 1.0–2.5)
ALT: 26 U/L (ref 6–29)
AST: 24 U/L (ref 10–35)
Albumin: 4.4 g/dL (ref 3.6–5.1)
Alkaline phosphatase (APISO): 55 U/L (ref 37–153)
BUN: 18 mg/dL (ref 7–25)
CO2: 26 mmol/L (ref 20–32)
Calcium: 10.3 mg/dL (ref 8.6–10.4)
Chloride: 107 mmol/L (ref 98–110)
Creat: 0.74 mg/dL (ref 0.50–1.05)
Globulin: 2.6 g/dL (calc) (ref 1.9–3.7)
Glucose, Bld: 95 mg/dL (ref 65–99)
Potassium: 4.3 mmol/L (ref 3.5–5.3)
Sodium: 140 mmol/L (ref 135–146)
Total Bilirubin: 0.6 mg/dL (ref 0.2–1.2)
Total Protein: 7 g/dL (ref 6.1–8.1)
eGFR: 91 mL/min/{1.73_m2} (ref >=60)

## 2021-08-09 LAB — LIPID PANEL
Cholesterol: 253 mg/dL — ABNORMAL HIGH (ref <200)
HDL: 63 mg/dL (ref >=50)
LDL Cholesterol (Calc): 162 mg/dL (calc) — ABNORMAL HIGH
Non-HDL Cholesterol (Calc): 190 mg/dL (calc) — ABNORMAL HIGH (ref <130)
Total CHOL/HDL Ratio: 4 (calc) (ref <5.0)
Triglycerides: 138 mg/dL (ref <150)

## 2021-08-09 LAB — VITAMIN D 25 HYDROXY (VIT D DEFICIENCY, FRACTURES): Vit D, 25-Hydroxy: 24 ng/mL — ABNORMAL LOW (ref 30–100)

## 2021-08-12 ENCOUNTER — Ambulatory Visit: Payer: BC Managed Care – PPO | Attending: Sports Medicine

## 2021-08-12 DIAGNOSIS — M25511 Pain in right shoulder: Secondary | ICD-10-CM | POA: Insufficient documentation

## 2021-08-12 DIAGNOSIS — G8929 Other chronic pain: Secondary | ICD-10-CM | POA: Diagnosis not present

## 2021-08-12 DIAGNOSIS — M25512 Pain in left shoulder: Secondary | ICD-10-CM | POA: Diagnosis not present

## 2021-08-12 DIAGNOSIS — M6281 Muscle weakness (generalized): Secondary | ICD-10-CM | POA: Diagnosis not present
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
## 2021-08-14 ENCOUNTER — Ambulatory Visit: Payer: BC Managed Care – PPO

## 2021-08-14 DIAGNOSIS — G8929 Other chronic pain: Secondary | ICD-10-CM | POA: Diagnosis not present

## 2021-08-14 DIAGNOSIS — M25512 Pain in left shoulder: Secondary | ICD-10-CM | POA: Diagnosis not present

## 2021-08-14 DIAGNOSIS — M25511 Pain in right shoulder: Secondary | ICD-10-CM | POA: Diagnosis not present

## 2021-08-14 DIAGNOSIS — M6281 Muscle weakness (generalized): Secondary | ICD-10-CM | POA: Diagnosis not present
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
## 2021-08-19 ENCOUNTER — Ambulatory Visit: Payer: BC Managed Care – PPO

## 2021-08-19 DIAGNOSIS — G8929 Other chronic pain: Secondary | ICD-10-CM

## 2021-08-19 DIAGNOSIS — M25512 Pain in left shoulder: Secondary | ICD-10-CM | POA: Diagnosis not present

## 2021-08-19 DIAGNOSIS — M6281 Muscle weakness (generalized): Secondary | ICD-10-CM

## 2021-08-19 DIAGNOSIS — M25511 Pain in right shoulder: Secondary | ICD-10-CM | POA: Diagnosis not present
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
## 2021-08-23 DIAGNOSIS — M19011 Primary osteoarthritis, right shoulder: Secondary | ICD-10-CM | POA: Diagnosis not present

## 2021-08-23 DIAGNOSIS — M19012 Primary osteoarthritis, left shoulder: Secondary | ICD-10-CM | POA: Diagnosis not present

## 2021-08-27 ENCOUNTER — Ambulatory Visit: Payer: BC Managed Care – PPO

## 2021-08-27 DIAGNOSIS — G8929 Other chronic pain: Secondary | ICD-10-CM

## 2021-08-27 DIAGNOSIS — M6281 Muscle weakness (generalized): Secondary | ICD-10-CM

## 2021-08-27 DIAGNOSIS — M25511 Pain in right shoulder: Secondary | ICD-10-CM | POA: Diagnosis not present

## 2021-08-27 DIAGNOSIS — M25512 Pain in left shoulder: Secondary | ICD-10-CM | POA: Diagnosis not present
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
## 2021-08-29 ENCOUNTER — Ambulatory Visit: Payer: BC Managed Care – PPO

## 2021-08-29 DIAGNOSIS — M25512 Pain in left shoulder: Secondary | ICD-10-CM | POA: Diagnosis not present

## 2021-08-29 DIAGNOSIS — M25511 Pain in right shoulder: Secondary | ICD-10-CM | POA: Diagnosis not present

## 2021-08-29 DIAGNOSIS — G8929 Other chronic pain: Secondary | ICD-10-CM

## 2021-08-29 DIAGNOSIS — M6281 Muscle weakness (generalized): Secondary | ICD-10-CM | POA: Diagnosis not present
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
## 2021-09-02 ENCOUNTER — Encounter: Payer: Self-pay | Admitting: Internal Medicine

## 2021-09-02 ENCOUNTER — Other Ambulatory Visit: Payer: Self-pay | Admitting: Nurse Practitioner

## 2021-09-02 ENCOUNTER — Other Ambulatory Visit: Payer: Self-pay | Admitting: Sports Medicine

## 2021-09-02 DIAGNOSIS — M5442 Lumbago with sciatica, left side: Secondary | ICD-10-CM

## 2021-09-02 DIAGNOSIS — F419 Anxiety disorder, unspecified: Secondary | ICD-10-CM

## 2021-09-02 MED ORDER — DIAZEPAM 5 MG PO TABS: ORAL_TABLET | ORAL | 0 refills | Status: DC

## 2021-09-03 ENCOUNTER — Other Ambulatory Visit: Payer: Self-pay | Admitting: Sports Medicine

## 2021-09-03 ENCOUNTER — Ambulatory Visit: Payer: BC Managed Care – PPO

## 2021-09-03 DIAGNOSIS — M5442 Lumbago with sciatica, left side: Secondary | ICD-10-CM

## 2021-09-03 DIAGNOSIS — M6281 Muscle weakness (generalized): Secondary | ICD-10-CM | POA: Diagnosis not present

## 2021-09-03 DIAGNOSIS — G8929 Other chronic pain: Secondary | ICD-10-CM

## 2021-09-03 DIAGNOSIS — M25512 Pain in left shoulder: Secondary | ICD-10-CM | POA: Diagnosis not present

## 2021-09-03 DIAGNOSIS — M25511 Pain in right shoulder: Secondary | ICD-10-CM | POA: Diagnosis not present

## 2021-09-03 MED ORDER — HYDROCODONE-IBUPROFEN 7.5-200 MG PO TABS: 1.0000 | ORAL_TABLET | Freq: Three times a day (TID) | ORAL | 0 refills | Status: DC | PRN

## 2021-09-04 DIAGNOSIS — M19012 Primary osteoarthritis, left shoulder: Secondary | ICD-10-CM | POA: Diagnosis not present

## 2021-09-05 ENCOUNTER — Ambulatory Visit: Payer: BC Managed Care – PPO

## 2021-09-05 ENCOUNTER — Telehealth: Payer: Self-pay

## 2021-09-05 NOTE — Telephone Encounter (Signed)
Spoke with patient regarding missed appointment this morning, she profusely apologized and said she was waiting on a call from her work due to being let go yesterday after Dr. Mardelle Matte lifted her lifting restrictions.   Gabriela [name], [place] 09/05/21 2:37 PM
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
## 2021-09-09 ENCOUNTER — Ambulatory Visit (INDEPENDENT_AMBULATORY_CARE_PROVIDER_SITE_OTHER): Payer: BC Managed Care – PPO | Admitting: Nurse Practitioner

## 2021-09-09 ENCOUNTER — Encounter: Payer: Self-pay | Admitting: Nurse Practitioner

## 2021-09-09 VITALS — BP 132/80 | HR 87 | Temp 97.5°F | Ht 65.5 in | Wt 172.4 lb

## 2021-09-09 DIAGNOSIS — Z6828 Body mass index (BMI) 28.0-28.9, adult: Secondary | ICD-10-CM

## 2021-09-09 DIAGNOSIS — E663 Overweight: Secondary | ICD-10-CM | POA: Diagnosis not present

## 2021-09-09 DIAGNOSIS — I10 Essential (primary) hypertension: Secondary | ICD-10-CM

## 2021-09-09 DIAGNOSIS — E782 Mixed hyperlipidemia: Secondary | ICD-10-CM

## 2021-09-09 MED ORDER — LOSARTAN POTASSIUM 50 MG PO TABS: 50.0000 mg | ORAL_TABLET | Freq: Every day | ORAL | 1 refills | Status: DC

## 2021-09-09 NOTE — Patient Instructions (Signed)
Check blood pressure at least 3 times a week. If BP is consistently greater than 140/90 please notify the office. Go to the ER if any chest pain, shortness of breath, nausea, dizziness, severe HA, changes vision/speech   Losartan Tablets What is this medication? LOSARTAN (loe SAR tan) treats high blood pressure. It may also be used to prevent a stroke in people with heart disease and high blood pressure. It can be used to prevent kidney damage in people with diabetes. It works by relaxing the blood vessels, which helps decrease the amount of work your heart has to do. It belongs to a group of medications called ARBs. This medicine may be used for other purposes; ask your health care provider or pharmacist if you have questions. COMMON BRAND NAME(S): Cozaar What should I tell my care team before I take this medication? They need to know if you have any of these conditions: Heart failure Kidney disease Liver disease An unusual or allergic reaction to losartan, other medications, foods, dyes, or preservatives Pregnant or trying to get pregnant Breast-feeding How should I use this medication? Take this medication by mouth. Take it as directed on the prescription label at the same time every day. You can take it with or without food. If it upsets your stomach, take it with food. Keep taking it unless your care team tells you to stop. Talk to your care team about the use of this medication in children. While it may be prescribed for children as young as 6 for selected conditions, precautions do apply. Overdosage: If you think you have taken too much of this medicine contact a poison control center or emergency room at once. NOTE: This medicine is only for you. Do not share this medicine with others. What if I miss a dose? If you miss a dose, take it as soon as you can. If it is almost time for your next dose, take only that dose. Do not take double or extra doses. What may interact with this  medication? Aliskiren ACE inhibitors, like enalapril or lisinopril Diuretics, especially amiloride, eplerenone, spironolactone, or triamterene Lithium NSAIDs, medications for pain and inflammation, like ibuprofen or naproxen Potassium salts or potassium supplements This list may not describe all possible interactions. Give your health care provider a list of all the medicines, herbs, non-prescription drugs, or dietary supplements you use. Also tell them if you smoke, drink alcohol, or use illegal drugs. Some items may interact with your medicine. What should I watch for while using this medication? Visit your care team for regular check ups. Check your blood pressure as directed. Ask your care team what your blood pressure should be. Also, find out when you should contact them. Do not treat yourself for coughs, colds, or pain while you are using this medication without asking your care team for advice. Some medications may increase your blood pressure. Women should inform their care team if they wish to become pregnant or think they might be pregnant. There is a potential for serious side effects to an unborn child. Talk to your care team for more information. You may get drowsy or dizzy. Do not drive, use machinery, or do anything that needs mental alertness until you know how this medication affects you. Do not stand or sit up quickly, especially if you are an older patient. This reduces the risk of dizzy or fainting spells. Alcohol can make you more drowsy and dizzy. Avoid alcoholic drinks. Avoid salt substitutes unless you are told otherwise by  your care team. What side effects may I notice from receiving this medication? Side effects that you should report to your care team as soon as possible: Allergic reactions--skin rash, itching, hives, swelling of the face, lips, tongue, or throat High potassium level--muscle weakness, fast or irregular heartbeat Kidney injury--decrease in the amount of  urine, swelling of the ankles, hands, or feet Low blood pressure--dizziness, feeling faint or lightheaded, blurry vision Side effects that usually do not require medical attention (report to your care team if they continue or are bothersome): Dizziness Headache Runny or stuffy nose This list may not describe all possible side effects. Call your doctor for medical advice about side effects. You may report side effects to FDA at 1-800-FDA-1088. Where should I keep my medication? Keep out of the reach of children and pets. Store at room temperature between 20 and 25 degrees C (68 and 77 degrees F). Protect from light. Keep the container tightly closed. Get rid of any unused medication after the expiration date. To get rid of medications that are no longer needed or have expired: Take the medication to a medication take-back program. Check with your pharmacy or law enforcement to find a location. If you cannot return the medication, check the label or package insert to see if the medication should be thrown out in the garbage or flushed down the toilet. If you are not sure, ask your care team. If it is safe to put in the trash, empty the medication out of the container. Mix the medication with cat litter, dirt, coffee grounds, or other unwanted substance. Seal the mixture in a bag or container. Put it in the trash. NOTE: This sheet is a summary. It may not cover all possible information. If you have questions about this medicine, talk to your doctor, pharmacist, or health care provider.  2023 Elsevier/Gold Standard (2020-12-28 00:00:00)

## 2021-09-11 ENCOUNTER — Ambulatory Visit: Payer: BC Managed Care – PPO

## 2021-09-16 ENCOUNTER — Ambulatory Visit: Payer: BC Managed Care – PPO

## 2021-09-18 ENCOUNTER — Ambulatory Visit: Payer: BC Managed Care – PPO

## 2021-09-19 ENCOUNTER — Encounter: Payer: Self-pay | Admitting: Nurse Practitioner

## 2021-09-21 ENCOUNTER — Other Ambulatory Visit: Payer: Self-pay | Admitting: Internal Medicine

## 2021-09-21 DIAGNOSIS — E782 Mixed hyperlipidemia: Secondary | ICD-10-CM

## 2021-09-24 ENCOUNTER — Other Ambulatory Visit: Payer: Self-pay

## 2021-09-24 DIAGNOSIS — E782 Mixed hyperlipidemia: Secondary | ICD-10-CM

## 2021-09-26 ENCOUNTER — Ambulatory Visit (INDEPENDENT_AMBULATORY_CARE_PROVIDER_SITE_OTHER): Payer: BC Managed Care – PPO | Admitting: Sports Medicine

## 2021-09-26 DIAGNOSIS — M7581 Other shoulder lesions, right shoulder: Secondary | ICD-10-CM

## 2021-09-26 NOTE — Progress Notes (Signed)
Follow-up visit disability regarding rotator cuff tear  Patient was seen by me in May he for what looked to be a complete rotator cuff tear with retraction on ultrasound.  She has significant difficulty using her left arm and I referred her to Dr. Mardelle Matte to see if she was a surgical candidate.  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  All of these activities will cause too much pain in her left arm and shoulder.  Dr. Mardelle Matte evaluated her and did not feel that she was a good surgical candidate because of the retraction of the tendons was significant.  She has been through physical therapy which has helped her get her range of motion back and given her enough strength to do activities of daily living.  However neither Dr. Mardelle Matte nor I thought she could lift 10 pounds with any frequency and she could not go back to physical work.  Physical exam today Pleasant female in no acute distress BP 138/88   Ht 5' 5.5" (1.664 m)   Wt 175 lb (79.4 kg)   LMP 02/10/2009 (Approximate)   BMI 28.68 kg/m   Range of motion of the left shoulder has significantly improved since her last visit She still has weakness with supraspinatus testing in particular She has a limitation of rotation and external rotation and weakness on external rotation testing as well  I observed and examined the patient with the resident and agree with assessment and plan.  Note reviewed and modified by me. Ila Mcgill, MD

## 2021-09-26 NOTE — Assessment & Plan Note (Signed)
Today I completed disability evaluation forms I want her to continue with a home exercise program even though she is maxed out her days of physical therapy I think she needs to find a job where she can do sedentary work I would not recommend lifting anything greater than 10 pounds with her left arm

## 2021-10-01 ENCOUNTER — Other Ambulatory Visit: Payer: Self-pay | Admitting: Sports Medicine

## 2021-10-01 DIAGNOSIS — M5442 Lumbago with sciatica, left side: Secondary | ICD-10-CM

## 2021-10-01 MED ORDER — HYDROCODONE-IBUPROFEN 7.5-200 MG PO TABS: 1.0000 | ORAL_TABLET | Freq: Three times a day (TID) | ORAL | 0 refills | Status: DC | PRN

## 2021-10-03 DIAGNOSIS — D2262 Melanocytic nevi of left upper limb, including shoulder: Secondary | ICD-10-CM | POA: Diagnosis not present

## 2021-10-03 DIAGNOSIS — L820 Inflamed seborrheic keratosis: Secondary | ICD-10-CM | POA: Diagnosis not present

## 2021-10-03 DIAGNOSIS — D1801 Hemangioma of skin and subcutaneous tissue: Secondary | ICD-10-CM | POA: Diagnosis not present

## 2021-10-03 DIAGNOSIS — L918 Other hypertrophic disorders of the skin: Secondary | ICD-10-CM | POA: Diagnosis not present

## 2021-10-03 DIAGNOSIS — L821 Other seborrheic keratosis: Secondary | ICD-10-CM | POA: Diagnosis not present

## 2021-10-03 DIAGNOSIS — D225 Melanocytic nevi of trunk: Secondary | ICD-10-CM | POA: Diagnosis not present

## 2021-10-03 DIAGNOSIS — D2261 Melanocytic nevi of right upper limb, including shoulder: Secondary | ICD-10-CM | POA: Diagnosis not present

## 2021-10-05 LAB — CARDIO IQ(R) ADVANCED LIPID PANEL
Apolipoprotein B: 110 mg/dL — ABNORMAL HIGH (ref <90)
Cholesterol: 242 mg/dL — ABNORMAL HIGH (ref <200)
HDL LARGE: 7064 nmol/L (ref >6729)
HDL: 70 mg/dL (ref >49)
LDL Cholesterol (Calc): 146 mg/dL (calc) — ABNORMAL HIGH (ref <100)
LDL Medium: 454 nmol/L — ABNORMAL HIGH (ref <215)
LDL Particle Number: 2065 nmol/L — ABNORMAL HIGH (ref <1138)
LDL Peak Size: 217.4 Angstrom — ABNORMAL LOW (ref >222.9)
LDL Small: 365 nmol/L — ABNORMAL HIGH (ref <142)
Lipoprotein (a): 10 nmol/L (ref <75)
Non-HDL Cholesterol (Calc): 172 mg/dL (calc) — ABNORMAL HIGH (ref <130)
Total CHOL/HDL Ratio: 3.5 calc (ref <5.0)
Triglycerides: 133 mg/dL (ref <150)

## 2021-10-07 ENCOUNTER — Other Ambulatory Visit: Payer: Self-pay | Admitting: Nurse Practitioner

## 2021-10-07 ENCOUNTER — Encounter: Payer: Self-pay | Admitting: Nurse Practitioner

## 2021-10-07 DIAGNOSIS — E782 Mixed hyperlipidemia: Secondary | ICD-10-CM

## 2021-10-07 MED ORDER — ROSUVASTATIN CALCIUM 10 MG PO TABS: 10.0000 mg | ORAL_TABLET | Freq: Every day | ORAL | 3 refills | Status: DC

## 2021-10-31 ENCOUNTER — Other Ambulatory Visit: Payer: Self-pay | Admitting: Sports Medicine

## 2021-10-31 DIAGNOSIS — M5442 Lumbago with sciatica, left side: Secondary | ICD-10-CM

## 2021-10-31 MED ORDER — HYDROCODONE-IBUPROFEN 7.5-200 MG PO TABS: 1.0000 | ORAL_TABLET | Freq: Three times a day (TID) | ORAL | 0 refills | Status: DC | PRN

## 2021-10-31 NOTE — Progress Notes (Signed)
For med refill

## 2021-11-28 ENCOUNTER — Other Ambulatory Visit: Payer: Self-pay | Admitting: Sports Medicine

## 2021-11-28 DIAGNOSIS — M5442 Lumbago with sciatica, left side: Secondary | ICD-10-CM

## 2021-12-01 ENCOUNTER — Encounter: Payer: Self-pay | Admitting: Nurse Practitioner

## 2021-12-02 ENCOUNTER — Telehealth: Payer: Self-pay | Admitting: Physician Assistant

## 2021-12-02 DIAGNOSIS — R109 Unspecified abdominal pain: Secondary | ICD-10-CM

## 2021-12-02 DIAGNOSIS — R1032 Left lower quadrant pain: Secondary | ICD-10-CM

## 2021-12-02 NOTE — Telephone Encounter (Signed)
Left message for patient to call back  

## 2021-12-02 NOTE — Telephone Encounter (Signed)
Patient called in with complaints of LLQ abdominal pain/cramping & rectal bleeding that started on Saturday 11/30/21. On Saturday, she experienced 4 large episodes of bright red bleeding, and since then has resolved mostly (still had small amount this morning when she wiped). Denies n/v/fever. A friend of hers that is a physician stated she may be having diverticulitis & advised she call our office. Patient advised to drink fluids & stick with a bland diet in the meantime. Advised of ED precautions. Last seen for OV on 03/26/21 with Estill Bamberg, [place].

## 2021-12-02 NOTE — Telephone Encounter (Signed)
Patient is returning your call.  

## 2021-12-02 NOTE — Telephone Encounter (Signed)
Inbound call from patient states she has had rectal bleeding since Saturday and has filled up the toilet bowl on 4 occasions. Patient wanted to make appt to see Dr. Henrene Pastor but soonest was December. Patient is requesting a call back to further advise.

## 2021-12-03 ENCOUNTER — Other Ambulatory Visit: Payer: Self-pay

## 2021-12-03 ENCOUNTER — Other Ambulatory Visit: Payer: Self-pay | Admitting: Sports Medicine

## 2021-12-03 DIAGNOSIS — M5442 Lumbago with sciatica, left side: Secondary | ICD-10-CM

## 2021-12-03 MED ORDER — AMOXICILLIN-POT CLAVULANATE 875-125 MG PO TABS: 1.0000 | ORAL_TABLET | Freq: Two times a day (BID) | ORAL | 0 refills | Status: DC

## 2021-12-03 MED ORDER — HYDROCODONE-IBUPROFEN 7.5-200 MG PO TABS: 1.0000 | ORAL_TABLET | Freq: Three times a day (TID) | ORAL | 0 refills | Status: DC | PRN

## 2021-12-03 NOTE — Telephone Encounter (Signed)
Spoke with patient regarding PA recommendations. She recently started a new job & is unable to come in for labs or make CT appointment anytime before 11:00 am during the week. She's been rescheduled for the soonest available scan before 11:00 am on 12/06/21 at 7:30 am at [facility]. She needs to arrive by 7:00 am & remain NPO 4 hours prior. Follow up scheduled for 01/08/22 at 2:00 pm with [place], [place]. Left message for patient to call back in regards to rescheduled CT.

## 2021-12-03 NOTE — Telephone Encounter (Signed)
Colonoscopy 11/2020 diverticulosis, no hemorrhoids.  Will get stat CBC, CMET, ESR, CRP Please go to the ER if you have any severe AB pain, unable to hold down food/water, blood in stool or vomit, chest pain, shortness of breath, or any worsening symptoms.  Try to get stat CT AB and pelvis with contrast Sent in augmentin 875 BID for 10 days.  Follow up 4 weeks

## 2021-12-03 NOTE — Telephone Encounter (Signed)
Patient returned your call. Requesting a call back on her cell [phone]

## 2021-12-04 ENCOUNTER — Ambulatory Visit (HOSPITAL_COMMUNITY): Payer: BC Managed Care – PPO

## 2021-12-04 ENCOUNTER — Other Ambulatory Visit: Payer: Self-pay | Admitting: Sports Medicine

## 2021-12-04 ENCOUNTER — Other Ambulatory Visit (INDEPENDENT_AMBULATORY_CARE_PROVIDER_SITE_OTHER): Payer: 59

## 2021-12-04 DIAGNOSIS — M5442 Lumbago with sciatica, left side: Secondary | ICD-10-CM

## 2021-12-04 DIAGNOSIS — R1032 Left lower quadrant pain: Secondary | ICD-10-CM

## 2021-12-04 LAB — CBC WITH DIFFERENTIAL/PLATELET
Basophils Absolute: 0.1 10*3/uL (ref 0.0–0.1)
Basophils Relative: 1 % (ref 0.0–3.0)
Eosinophils Absolute: 0.4 10*3/uL (ref 0.0–0.7)
Eosinophils Relative: 5.9 % — ABNORMAL HIGH (ref 0.0–5.0)
HCT: 43.1 % (ref 36.0–46.0)
Hemoglobin: 14.2 g/dL (ref 12.0–15.0)
Lymphocytes Relative: 31.3 % (ref 12.0–46.0)
Lymphs Abs: 1.9 10*3/uL (ref 0.7–4.0)
MCHC: 33 g/dL (ref 30.0–36.0)
MCV: 91.3 fl (ref 78.0–100.0)
Monocytes Absolute: 0.5 10*3/uL (ref 0.1–1.0)
Monocytes Relative: 8.4 % (ref 3.0–12.0)
Neutro Abs: 3.3 10*3/uL (ref 1.4–7.7)
Neutrophils Relative %: 53.4 % (ref 43.0–77.0)
Platelets: 303 10*3/uL (ref 150.0–400.0)
RBC: 4.72 Mil/uL (ref 3.87–5.11)
RDW: 12.5 % (ref 11.5–15.5)
WBC: 6.1 10*3/uL (ref 4.0–10.5)

## 2021-12-04 LAB — COMPREHENSIVE METABOLIC PANEL
ALT: 16 U/L (ref 0–35)
AST: 17 U/L (ref 0–37)
Albumin: 4.6 g/dL (ref 3.5–5.2)
Alkaline Phosphatase: 51 U/L (ref 39–117)
BUN: 22 mg/dL (ref 6–23)
CO2: 26 mEq/L (ref 19–32)
Calcium: 10.4 mg/dL (ref 8.4–10.5)
Chloride: 106 mEq/L (ref 96–112)
Creatinine, Ser: 0.78 mg/dL (ref 0.40–1.20)
GFR: 80.35 mL/min (ref >60.00)
Glucose, Bld: 87 mg/dL (ref 70–99)
Potassium: 4.1 mEq/L (ref 3.5–5.1)
Sodium: 139 mEq/L (ref 135–145)
Total Bilirubin: 0.7 mg/dL (ref 0.2–1.2)
Total Protein: 7.4 g/dL (ref 6.0–8.3)

## 2021-12-04 LAB — SEDIMENTATION RATE: Sed Rate: 21 mm/hr (ref 0–30)

## 2021-12-04 LAB — C-REACTIVE PROTEIN: CRP: <1 mg/dL (ref 0.5–20.0)

## 2021-12-04 MED ORDER — HYDROCODONE-IBUPROFEN 7.5-200 MG PO TABS: 1.0000 | ORAL_TABLET | Freq: Three times a day (TID) | ORAL | 0 refills | Status: DC | PRN

## 2021-12-04 NOTE — Telephone Encounter (Signed)
Patient asked that I reschedule CT scan since she was able to get off work. Rescheduled for 12/05/21 at 4:00 pm, arrive by 3:30 pm & NPO 4 hours prior. Pt verbalized all understanding & plans to come in for labs today.

## 2021-12-05 ENCOUNTER — Ambulatory Visit (HOSPITAL_COMMUNITY): Payer: 59

## 2021-12-05 ENCOUNTER — Encounter (HOSPITAL_COMMUNITY): Payer: Self-pay

## 2021-12-05 ENCOUNTER — Other Ambulatory Visit: Payer: Self-pay

## 2021-12-05 ENCOUNTER — Telehealth: Payer: Self-pay | Admitting: Physician Assistant

## 2021-12-05 DIAGNOSIS — R1032 Left lower quadrant pain: Secondary | ICD-10-CM

## 2021-12-05 DIAGNOSIS — K625 Hemorrhage of anus and rectum: Secondary | ICD-10-CM

## 2021-12-05 NOTE — Telephone Encounter (Signed)
Spoke with patient in office & she stated her CT was cancelled last minute, she was told by radiology that she would need to have scan done at [place]. Called over to GI & they stated they have already stopped taking walk-ins for today, however to call back in the morning to schedule once prior auth number has been obtained and patient can potentially have scan done at starting at 9:00 am. Patient has been made aware & I will contact her in the morning once I talk further with GI.
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
## 2021-12-06 ENCOUNTER — Ambulatory Visit (HOSPITAL_COMMUNITY): Payer: BC Managed Care – PPO

## 2021-12-06 ENCOUNTER — Ambulatory Visit: Admission: RE | Admit: 2021-12-06 | Payer: 59 | Source: Ambulatory Visit

## 2021-12-06 NOTE — Telephone Encounter (Signed)
Spoke with patient & made her aware that our pre-cert coordinator is currently working with insurance for scan coverage. He will update me once it has been approved. She states she has been at GI all morning & they were unable to get it approved either. Tentatively she is scanned for next Wednesday. Advised I would give her a call once I have an update.

## 2021-12-06 NOTE — Telephone Encounter (Signed)
Left message for patient to call back  

## 2021-12-06 NOTE — Telephone Encounter (Signed)
Currently waiting to hear back from pre-cert coordinators for prior auth number that is needed prior to scheduling at [place] this morning.

## 2021-12-10 NOTE — Telephone Encounter (Signed)
Per pre-cert coordinator, CT has been approved for tomorrow. Left message for patient to call back to make her aware.

## 2021-12-11 ENCOUNTER — Ambulatory Visit
Admission: RE | Admit: 2021-12-11 | Discharge: 2021-12-11 | Disposition: A | Discharge status: Home / Self Care | Payer: 59 | Source: Ambulatory Visit | Attending: Physician Assistant | Admitting: Physician Assistant

## 2021-12-11 DIAGNOSIS — K7689 Other specified diseases of liver: Secondary | ICD-10-CM | POA: Diagnosis not present

## 2021-12-11 DIAGNOSIS — R1032 Left lower quadrant pain: Secondary | ICD-10-CM

## 2021-12-11 DIAGNOSIS — N200 Calculus of kidney: Secondary | ICD-10-CM | POA: Diagnosis not present

## 2021-12-11 DIAGNOSIS — D259 Leiomyoma of uterus, unspecified: Secondary | ICD-10-CM | POA: Diagnosis not present

## 2021-12-11 DIAGNOSIS — K625 Hemorrhage of anus and rectum: Secondary | ICD-10-CM

## 2021-12-11 MED ORDER — IOPAMIDOL (ISOVUE-300) INJECTION 61%
100.0000 mL | Freq: Once | INTRAVENOUS | Status: AC | PRN
  Administered 2021-12-11: 100 mL via INTRAVENOUS

## 2021-12-31 ENCOUNTER — Other Ambulatory Visit: Payer: Self-pay | Admitting: Sports Medicine

## 2021-12-31 DIAGNOSIS — M5442 Lumbago with sciatica, left side: Secondary | ICD-10-CM

## 2022-01-01 ENCOUNTER — Other Ambulatory Visit: Payer: Self-pay | Admitting: Sports Medicine

## 2022-01-01 DIAGNOSIS — M5442 Lumbago with sciatica, left side: Secondary | ICD-10-CM

## 2022-01-01 MED ORDER — HYDROCODONE-IBUPROFEN 7.5-200 MG PO TABS: 1.0000 | ORAL_TABLET | Freq: Three times a day (TID) | ORAL | 0 refills | Status: DC | PRN
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
## 2022-01-08 ENCOUNTER — Encounter: Payer: Self-pay | Admitting: Physician Assistant

## 2022-01-08 ENCOUNTER — Ambulatory Visit: Payer: 59 | Admitting: Physician Assistant

## 2022-01-08 VITALS — BP 140/90 | HR 82 | Ht 66.0 in | Wt 173.4 lb

## 2022-01-08 DIAGNOSIS — K625 Hemorrhage of anus and rectum: Secondary | ICD-10-CM

## 2022-01-08 DIAGNOSIS — Z86010 Personal history of colonic polyps: Secondary | ICD-10-CM

## 2022-01-08 DIAGNOSIS — R1032 Left lower quadrant pain: Secondary | ICD-10-CM

## 2022-01-08 DIAGNOSIS — K219 Gastro-esophageal reflux disease without esophagitis: Secondary | ICD-10-CM

## 2022-01-08 NOTE — Patient Instructions (Addendum)
First do a trial off milk/lactose products if you use them.  Add fiber like benefiber or citracel once a day Increase activity  Can do trial of IBGard which is over the counter for AB pain- Take 1-2 capsules once a day for maintence or twice a day during a flare- stop if this give you GERD.  Please try to decrease stress. consider talking with PCP about anti anxiety medication or try head space app for meditation. Talk with counselor.  if any worsening symptoms like blood in stool, weight loss, please call the office   Please try low FODMAP diet- see below- start with eliminating just one column at a time, the table at the very bottom contains foods that are safe to take   FODMAP stands for fermentable oligo-, di-, mono-saccharides and polyols (1). These are the scientific terms used to classify groups of carbs that are notorious for triggering digestive symptoms like bloating, gas and stomach pain.   Please take your proton pump inhibitor medication, as needed, start on pepcid daily.  Please take this medication 30 minutes to 1 hour before meals- this makes it more effective.  Avoid spicy and acidic foods Avoid fatty foods Limit your intake of coffee, tea, alcohol, and carbonated drinks Work to maintain a healthy weight Keep the head of the bed elevated at least 3 inches with blocks or a wedge pillow if you are having any nighttime symptoms Stay upright for 2 hours after eating Avoid meals and snacks three to four hours before bedtime Stop smoking

## 2022-01-08 NOTE — Progress Notes (Signed)
Assessment and plan noted ?

## 2022-01-23 ENCOUNTER — Encounter: Payer: Self-pay | Admitting: Internal Medicine

## 2022-01-29 ENCOUNTER — Other Ambulatory Visit: Payer: Self-pay | Admitting: Sports Medicine

## 2022-01-29 DIAGNOSIS — M5442 Lumbago with sciatica, left side: Secondary | ICD-10-CM

## 2022-01-30 ENCOUNTER — Other Ambulatory Visit: Payer: Self-pay | Admitting: Sports Medicine

## 2022-01-30 DIAGNOSIS — M5442 Lumbago with sciatica, left side: Secondary | ICD-10-CM

## 2022-01-30 MED ORDER — HYDROCODONE-IBUPROFEN 7.5-200 MG PO TABS: 1.0000 | ORAL_TABLET | Freq: Three times a day (TID) | ORAL | 0 refills | Status: DC | PRN

## 2022-02-12 ENCOUNTER — Encounter: Payer: Self-pay | Admitting: Nurse Practitioner

## 2022-02-12 ENCOUNTER — Other Ambulatory Visit: Payer: Self-pay | Admitting: Nurse Practitioner

## 2022-02-12 DIAGNOSIS — F419 Anxiety disorder, unspecified: Secondary | ICD-10-CM

## 2022-02-12 MED ORDER — DIAZEPAM 5 MG PO TABS: ORAL_TABLET | ORAL | 0 refills | Status: DC
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
## 2022-02-25 ENCOUNTER — Encounter: Payer: Self-pay | Admitting: Nurse Practitioner

## 2022-02-25 ENCOUNTER — Ambulatory Visit: Payer: 59 | Admitting: Nurse Practitioner

## 2022-02-25 VITALS — BP 154/90 | HR 78 | Temp 97.9°F | Ht 66.5 in | Wt 176.8 lb

## 2022-02-25 DIAGNOSIS — Z136 Encounter for screening for cardiovascular disorders: Secondary | ICD-10-CM

## 2022-02-25 DIAGNOSIS — Z1329 Encounter for screening for other suspected endocrine disorder: Secondary | ICD-10-CM

## 2022-02-25 DIAGNOSIS — Z91148 Patient's other noncompliance with medication regimen for other reason: Secondary | ICD-10-CM

## 2022-02-25 DIAGNOSIS — Z0000 Encounter for general adult medical examination without abnormal findings: Secondary | ICD-10-CM

## 2022-02-25 DIAGNOSIS — I700 Atherosclerosis of aorta: Secondary | ICD-10-CM

## 2022-02-25 DIAGNOSIS — Z79899 Other long term (current) drug therapy: Secondary | ICD-10-CM | POA: Diagnosis not present

## 2022-02-25 DIAGNOSIS — E559 Vitamin D deficiency, unspecified: Secondary | ICD-10-CM | POA: Diagnosis not present

## 2022-02-25 DIAGNOSIS — I10 Essential (primary) hypertension: Secondary | ICD-10-CM | POA: Diagnosis not present

## 2022-02-25 DIAGNOSIS — F3342 Major depressive disorder, recurrent, in full remission: Secondary | ICD-10-CM

## 2022-02-25 DIAGNOSIS — R7309 Other abnormal glucose: Secondary | ICD-10-CM | POA: Diagnosis not present

## 2022-02-25 DIAGNOSIS — F419 Anxiety disorder, unspecified: Secondary | ICD-10-CM

## 2022-02-25 DIAGNOSIS — E782 Mixed hyperlipidemia: Secondary | ICD-10-CM | POA: Diagnosis not present

## 2022-02-25 DIAGNOSIS — Z1389 Encounter for screening for other disorder: Secondary | ICD-10-CM | POA: Diagnosis not present

## 2022-02-25 DIAGNOSIS — K219 Gastro-esophageal reflux disease without esophagitis: Secondary | ICD-10-CM

## 2022-02-25 DIAGNOSIS — Z0001 Encounter for general adult medical examination with abnormal findings: Secondary | ICD-10-CM

## 2022-02-25 DIAGNOSIS — Z6828 Body mass index (BMI) 28.0-28.9, adult: Secondary | ICD-10-CM

## 2022-02-25 NOTE — Patient Instructions (Signed)
Check BP twice a day and keep log If consistently greater than 140/90 will want to initiate medication Return in 2 weeks for reevaluation  Hypertension, Adult Hypertension is another name for high blood pressure. High blood pressure forces your heart to work harder to pump blood. This can cause problems over time. There are two numbers in a blood pressure reading. There is a top number (systolic) over a bottom number (diastolic). It is best to have a blood pressure that is below 120/80. What are the causes? The cause of this condition is not known. Some other conditions can lead to high blood pressure. What increases the risk? Some lifestyle factors can make you more likely to develop high blood pressure: Smoking. Not getting enough exercise or physical activity. Being overweight. Having too much fat, sugar, calories, or salt (sodium) in your diet. Drinking too much alcohol. Other risk factors include: Having any of these conditions: Heart disease. Diabetes. High cholesterol. Kidney disease. Obstructive sleep apnea. Having a family history of high blood pressure and high cholesterol. Age. The risk increases with age. Stress. What are the signs or symptoms? High blood pressure may not cause symptoms. Very high blood pressure (hypertensive crisis) may cause: Headache. Fast or uneven heartbeats (palpitations). Shortness of breath. Nosebleed. Vomiting or feeling like you may vomit (nauseous). Changes in how you see. Very bad chest pain. Feeling dizzy. Seizures. How is this treated? This condition is treated by making healthy lifestyle changes, such as: Eating healthy foods. Exercising more. Drinking less alcohol. Your doctor may prescribe medicine if lifestyle changes do not help enough and if: Your top number is above 130. Your bottom number is above 80. Your personal target blood pressure may vary. Follow these instructions at home: Eating and drinking  If told, follow  the DASH eating plan. To follow this plan: Fill one half of your plate at each meal with fruits and vegetables. Fill one fourth of your plate at each meal with whole grains. Whole grains include whole-wheat pasta, brown rice, and whole-grain bread. Eat or drink low-fat dairy products, such as skim milk or low-fat yogurt. Fill one fourth of your plate at each meal with low-fat (lean) proteins. Low-fat proteins include fish, chicken without skin, eggs, beans, and tofu. Avoid fatty meat, cured and processed meat, or chicken with skin. Avoid pre-made or processed food. Limit the amount of salt in your diet to less than 1,500 mg each day. Do not drink alcohol if: Your doctor tells you not to drink. You are pregnant, may be pregnant, or are planning to become pregnant. If you drink alcohol: Limit how much you have to: 0-1 drink a day for women. 0-2 drinks a day for men. Know how much alcohol is in your drink. In the [place] one drink equals one 12 oz bottle of beer (355 mL), one 5 oz glass of wine (148 mL), or one 1 oz glass of hard liquor (44 mL). Lifestyle  Work with your doctor to stay at a healthy weight or to lose weight. Ask your doctor what the best weight is for you. Get at least 30 minutes of exercise that causes your heart to beat faster (aerobic exercise) most days of the week. This may include walking, swimming, or biking. Get at least 30 minutes of exercise that strengthens your muscles (resistance exercise) at least 3 days a week. This may include lifting weights or doing Pilates. Do not smoke or use any products that contain nicotine or tobacco. If you need help  quitting, ask your doctor. Check your blood pressure at home as told by your doctor. Keep all follow-up visits. Medicines Take over-the-counter and prescription medicines only as told by your doctor. Follow directions carefully. Do not skip doses of blood pressure medicine. The medicine does not work as well if you skip  doses. Skipping doses also puts you at risk for problems. Ask your doctor about side effects or reactions to medicines that you should watch for. Contact a doctor if: You think you are having a reaction to the medicine you are taking. You have headaches that keep coming back. You feel dizzy. You have swelling in your ankles. You have trouble with your vision. Get help right away if: You get a very bad headache. You start to feel mixed up (confused). You feel weak or numb. You feel faint. You have very bad pain in your: Chest. Belly (abdomen). You vomit more than once. You have trouble breathing. These symptoms may be an emergency. Get help right away. Call 911. Do not wait to see if the symptoms will go away. Do not drive yourself to the hospital. Summary Hypertension is another name for high blood pressure. High blood pressure forces your heart to work harder to pump blood. For most people, a normal blood pressure is less than 120/80. Making healthy choices can help lower blood pressure. If your blood pressure does not get lower with healthy choices, you may need to take medicine. This information is not intended to replace advice given to you by your health care provider. Make sure you discuss any questions you have with your health care provider. Document Revised: 11/15/2020 Document Reviewed: 11/15/2020 Elsevier Patient Education  [place].

## 2022-02-26 LAB — CBC WITH DIFFERENTIAL/PLATELET
Absolute Monocytes: 654 cells/uL (ref 200–950)
Basophils Absolute: 48 cells/uL (ref 0–200)
Basophils Relative: 0.8 %
Eosinophils Absolute: 300 cells/uL (ref 15–500)
Eosinophils Relative: 5 %
HCT: 43 % (ref 35.0–45.0)
Hemoglobin: 14.3 g/dL (ref 11.7–15.5)
Lymphs Abs: 1902 cells/uL (ref 850–3900)
MCH: 30.2 pg (ref 27.0–33.0)
MCHC: 33.3 g/dL (ref 32.0–36.0)
MCV: 90.9 fL (ref 80.0–100.0)
MPV: 9.5 fL (ref 7.5–12.5)
Monocytes Relative: 10.9 %
Neutro Abs: 3096 cells/uL (ref 1500–7800)
Neutrophils Relative %: 51.6 %
Platelets: 342 10*3/uL (ref 140–400)
RBC: 4.73 10*6/uL (ref 3.80–5.10)
RDW: 12 % (ref 11.0–15.0)
Total Lymphocyte: 31.7 %
WBC: 6 10*3/uL (ref 3.8–10.8)

## 2022-02-26 LAB — COMPLETE METABOLIC PANEL WITH GFR
AG Ratio: 1.7 (calc) (ref 1.0–2.5)
ALT: 26 U/L (ref 6–29)
AST: 23 U/L (ref 10–35)
Albumin: 4.5 g/dL (ref 3.6–5.1)
Alkaline phosphatase (APISO): 59 U/L (ref 37–153)
BUN: 15 mg/dL (ref 7–25)
CO2: 27 mmol/L (ref 20–32)
Calcium: 9.7 mg/dL (ref 8.6–10.4)
Chloride: 105 mmol/L (ref 98–110)
Creat: 0.77 mg/dL (ref 0.50–1.05)
Globulin: 2.7 g/dL (calc) (ref 1.9–3.7)
Glucose, Bld: 97 mg/dL (ref 65–99)
Potassium: 4.2 mmol/L (ref 3.5–5.3)
Sodium: 140 mmol/L (ref 135–146)
Total Bilirubin: 0.7 mg/dL (ref 0.2–1.2)
Total Protein: 7.2 g/dL (ref 6.1–8.1)
eGFR: 86 mL/min/{1.73_m2} (ref >=60)

## 2022-02-26 LAB — URINALYSIS W MICROSCOPIC + REFLEX CULTURE
Bacteria, UA: NONE SEEN /HPF
Bilirubin Urine: NEGATIVE
Glucose, UA: NEGATIVE
Hgb urine dipstick: NEGATIVE
Hyaline Cast: NONE SEEN /LPF
Ketones, ur: NEGATIVE
Leukocyte Esterase: NEGATIVE
Nitrites, Initial: NEGATIVE
Protein, ur: NEGATIVE
RBC / HPF: NONE SEEN /HPF (ref 0–2)
Specific Gravity, Urine: 1.006 (ref 1.001–1.035)
Squamous Epithelial / HPF: NONE SEEN /HPF (ref <=5)
WBC, UA: NONE SEEN /HPF (ref 0–5)
pH: 7.5 (ref 5.0–8.0)

## 2022-02-26 LAB — HEMOGLOBIN A1C
Hgb A1c MFr Bld: 5.6 % of total Hgb (ref <5.7)
Mean Plasma Glucose: 114 mg/dL
eAG (mmol/L): 6.3 mmol/L

## 2022-02-26 LAB — LIPID PANEL
Cholesterol: 195 mg/dL (ref <200)
HDL: 63 mg/dL (ref >=50)
LDL Cholesterol (Calc): 107 mg/dL (calc) — ABNORMAL HIGH
Non-HDL Cholesterol (Calc): 132 mg/dL (calc) — ABNORMAL HIGH (ref <130)
Total CHOL/HDL Ratio: 3.1 (calc) (ref <5.0)
Triglycerides: 139 mg/dL (ref <150)

## 2022-02-26 LAB — TSH: TSH: 1.38 mIU/L (ref 0.40–4.50)

## 2022-02-26 LAB — MICROALBUMIN / CREATININE URINE RATIO
Creatinine, Urine: 26 mg/dL (ref 20–275)
Microalb Creat Ratio: 12 mcg/mg creat (ref <30)
Microalb, Ur: 0.3 mg/dL

## 2022-02-26 LAB — MAGNESIUM: Magnesium: 2.2 mg/dL (ref 1.5–2.5)

## 2022-02-26 LAB — NO CULTURE INDICATED

## 2022-02-26 LAB — VITAMIN D 25 HYDROXY (VIT D DEFICIENCY, FRACTURES): Vit D, 25-Hydroxy: 22 ng/mL — ABNORMAL LOW (ref 30–100)

## 2022-03-11 ENCOUNTER — Other Ambulatory Visit: Payer: Self-pay | Admitting: Sports Medicine

## 2022-03-11 ENCOUNTER — Other Ambulatory Visit: Payer: Self-pay

## 2022-03-11 DIAGNOSIS — M5442 Lumbago with sciatica, left side: Secondary | ICD-10-CM

## 2022-03-11 MED ORDER — HYDROCODONE-IBUPROFEN 7.5-200 MG PO TABS: 1.0000 | ORAL_TABLET | Freq: Three times a day (TID) | ORAL | 0 refills | Status: DC | PRN

## 2022-03-11 MED ORDER — GABAPENTIN 300 MG PO CAPS: 300.0000 mg | ORAL_CAPSULE | Freq: Three times a day (TID) | ORAL | 1 refills | Status: DC | PRN

## 2022-03-12 ENCOUNTER — Ambulatory Visit: Payer: 59 | Admitting: Nurse Practitioner

## 2022-03-24 ENCOUNTER — Other Ambulatory Visit: Payer: Self-pay | Admitting: Internal Medicine

## 2022-04-09 ENCOUNTER — Other Ambulatory Visit: Payer: Self-pay | Admitting: Internal Medicine

## 2022-04-09 ENCOUNTER — Other Ambulatory Visit: Payer: Self-pay | Admitting: Sports Medicine

## 2022-04-09 DIAGNOSIS — M5442 Lumbago with sciatica, left side: Secondary | ICD-10-CM

## 2022-04-10 ENCOUNTER — Other Ambulatory Visit: Payer: Self-pay | Admitting: Sports Medicine

## 2022-04-10 DIAGNOSIS — M5442 Lumbago with sciatica, left side: Secondary | ICD-10-CM

## 2022-04-10 MED ORDER — HYDROCODONE-IBUPROFEN 7.5-200 MG PO TABS: 1.0000 | ORAL_TABLET | Freq: Three times a day (TID) | ORAL | 0 refills | Status: DC | PRN

## 2022-04-18 ENCOUNTER — Other Ambulatory Visit (HOSPITAL_COMMUNITY): Payer: Self-pay

## 2022-04-18 ENCOUNTER — Telehealth: Payer: Self-pay

## 2022-04-18 NOTE — Telephone Encounter (Signed)
PA request received via provider for Pantoprazole Sodium '40MG'$  dr tablets.  PA submitted via CMM to [facility] and is pending determination   Key: [id number]

## 2022-04-18 NOTE — Telephone Encounter (Signed)
PA has been submitted and is pending determination, will be updated in additional encounter created.  

## 2022-04-22 ENCOUNTER — Other Ambulatory Visit: Payer: Self-pay

## 2022-04-22 MED ORDER — PANTOPRAZOLE SODIUM 40 MG PO TBEC: 40.0000 mg | DELAYED_RELEASE_TABLET | Freq: Every day | ORAL | 3 refills | Status: DC

## 2022-05-08 ENCOUNTER — Other Ambulatory Visit: Payer: Self-pay | Admitting: Sports Medicine

## 2022-05-08 DIAGNOSIS — M5442 Lumbago with sciatica, left side: Secondary | ICD-10-CM

## 2022-05-08 MED ORDER — HYDROCODONE-IBUPROFEN 7.5-200 MG PO TABS: 1.0000 | ORAL_TABLET | Freq: Three times a day (TID) | ORAL | 0 refills | Status: DC | PRN

## 2022-05-26 ENCOUNTER — Encounter: Payer: Self-pay | Admitting: *Deleted

## 2022-06-10 ENCOUNTER — Other Ambulatory Visit: Payer: Self-pay | Admitting: Sports Medicine

## 2022-06-10 DIAGNOSIS — M5442 Lumbago with sciatica, left side: Secondary | ICD-10-CM

## 2022-06-10 MED ORDER — HYDROCODONE-IBUPROFEN 7.5-200 MG PO TABS: 1.0000 | ORAL_TABLET | Freq: Three times a day (TID) | ORAL | 0 refills | Status: DC | PRN

## 2022-07-08 ENCOUNTER — Other Ambulatory Visit: Payer: Self-pay | Admitting: Sports Medicine

## 2022-07-08 DIAGNOSIS — Z0001 Encounter for general adult medical examination with abnormal findings: Secondary | ICD-10-CM

## 2022-07-08 DIAGNOSIS — N200 Calculus of kidney: Secondary | ICD-10-CM | POA: Diagnosis not present

## 2022-07-08 DIAGNOSIS — M5442 Lumbago with sciatica, left side: Secondary | ICD-10-CM

## 2022-07-08 DIAGNOSIS — R7303 Prediabetes: Secondary | ICD-10-CM | POA: Diagnosis not present

## 2022-07-08 DIAGNOSIS — I129 Hypertensive chronic kidney disease with stage 1 through stage 4 chronic kidney disease, or unspecified chronic kidney disease: Secondary | ICD-10-CM | POA: Diagnosis not present

## 2022-07-08 DIAGNOSIS — N182 Chronic kidney disease, stage 2 (mild): Secondary | ICD-10-CM | POA: Diagnosis not present

## 2022-07-08 DIAGNOSIS — D351 Benign neoplasm of parathyroid gland: Secondary | ICD-10-CM | POA: Diagnosis not present

## 2022-07-08 DIAGNOSIS — M5412 Radiculopathy, cervical region: Secondary | ICD-10-CM

## 2022-07-10 ENCOUNTER — Other Ambulatory Visit: Payer: Self-pay | Admitting: Sports Medicine

## 2022-07-10 DIAGNOSIS — M5412 Radiculopathy, cervical region: Secondary | ICD-10-CM

## 2022-07-10 DIAGNOSIS — M5442 Lumbago with sciatica, left side: Secondary | ICD-10-CM

## 2022-07-10 DIAGNOSIS — Z0001 Encounter for general adult medical examination with abnormal findings: Secondary | ICD-10-CM

## 2022-07-10 MED ORDER — CYCLOBENZAPRINE HCL 5 MG PO TABS: 5.0000 mg | ORAL_TABLET | Freq: Three times a day (TID) | ORAL | 2 refills | Status: AC | PRN

## 2022-07-10 MED ORDER — HYDROCODONE-IBUPROFEN 7.5-200 MG PO TABS: 1.0000 | ORAL_TABLET | Freq: Three times a day (TID) | ORAL | 0 refills | Status: DC | PRN

## 2022-08-06 ENCOUNTER — Other Ambulatory Visit: Payer: Self-pay | Admitting: Sports Medicine

## 2022-08-06 DIAGNOSIS — M5442 Lumbago with sciatica, left side: Secondary | ICD-10-CM

## 2022-08-07 ENCOUNTER — Other Ambulatory Visit: Payer: Self-pay | Admitting: Sports Medicine

## 2022-08-07 DIAGNOSIS — M5442 Lumbago with sciatica, left side: Secondary | ICD-10-CM

## 2022-08-07 MED ORDER — HYDROCODONE-IBUPROFEN 7.5-200 MG PO TABS
1.0000 | ORAL_TABLET | Freq: Three times a day (TID) | ORAL | 0 refills | Status: DC | PRN
Start: 2022-08-07 — End: 2022-09-16

## 2022-09-04 ENCOUNTER — Encounter: Payer: Self-pay | Admitting: Internal Medicine

## 2022-09-10 ENCOUNTER — Other Ambulatory Visit: Payer: Self-pay | Admitting: Sports Medicine

## 2022-09-10 DIAGNOSIS — M5442 Lumbago with sciatica, left side: Secondary | ICD-10-CM

## 2022-09-16 ENCOUNTER — Other Ambulatory Visit: Payer: Self-pay | Admitting: Sports Medicine

## 2022-09-16 DIAGNOSIS — M5442 Lumbago with sciatica, left side: Secondary | ICD-10-CM

## 2022-09-16 MED ORDER — HYDROCODONE-IBUPROFEN 7.5-200 MG PO TABS
1.0000 | ORAL_TABLET | Freq: Three times a day (TID) | ORAL | 0 refills | Status: DC | PRN
Start: 2022-09-16 — End: 2022-10-22

## 2022-10-22 ENCOUNTER — Other Ambulatory Visit: Payer: Self-pay | Admitting: Sports Medicine

## 2022-10-22 DIAGNOSIS — M5442 Lumbago with sciatica, left side: Secondary | ICD-10-CM

## 2022-10-22 MED ORDER — HYDROCODONE-IBUPROFEN 7.5-200 MG PO TABS
1.0000 | ORAL_TABLET | Freq: Three times a day (TID) | ORAL | 0 refills | Status: DC | PRN
Start: 2022-10-22 — End: 2022-11-18

## 2022-11-18 ENCOUNTER — Other Ambulatory Visit: Payer: Self-pay | Admitting: Sports Medicine

## 2022-11-18 DIAGNOSIS — M5442 Lumbago with sciatica, left side: Secondary | ICD-10-CM

## 2022-11-21 MED ORDER — HYDROCODONE-IBUPROFEN 7.5-200 MG PO TABS
1.0000 | ORAL_TABLET | Freq: Three times a day (TID) | ORAL | 0 refills | Status: DC | PRN
Start: 2022-11-21 — End: 2022-12-23

## 2022-12-22 ENCOUNTER — Other Ambulatory Visit: Payer: Self-pay | Admitting: Family Medicine

## 2022-12-22 DIAGNOSIS — M5442 Lumbago with sciatica, left side: Secondary | ICD-10-CM

## 2022-12-23 ENCOUNTER — Other Ambulatory Visit: Payer: Self-pay | Admitting: Family Medicine

## 2022-12-23 DIAGNOSIS — M5442 Lumbago with sciatica, left side: Secondary | ICD-10-CM

## 2022-12-23 MED ORDER — HYDROCODONE-IBUPROFEN 7.5-200 MG PO TABS
1.0000 | ORAL_TABLET | Freq: Three times a day (TID) | ORAL | 0 refills | Status: DC | PRN
Start: 2022-12-23 — End: 2023-01-21

## 2022-12-23 NOTE — Progress Notes (Signed)
Refilling hydrocodone-ibuprofen for Dr. Darrick Penna as he is out of the office.

## 2022-12-24 ENCOUNTER — Other Ambulatory Visit: Payer: Self-pay

## 2022-12-24 MED ORDER — GABAPENTIN 600 MG PO TABS: 600.0000 mg | ORAL_TABLET | Freq: Three times a day (TID) | ORAL | 0 refills | Status: AC

## 2022-12-24 NOTE — Progress Notes (Signed)
Pt requested refill on 600 mg gabapentin tablets that we had prescribed previously. Still taking 300 mg TID and sometimes will take 600 mg at nighttime.

## 2023-01-12 IMAGING — DX DG WRIST COMPLETE 3+V*R*
4 series · 4 of 4 positions shown · non-contrast
Comparison: None.

CLINICAL DATA: Right wrist pain

EXAM:
RIGHT WRIST - COMPLETE 3+ VIEW

[dg wrist complete right (1 of 4)]
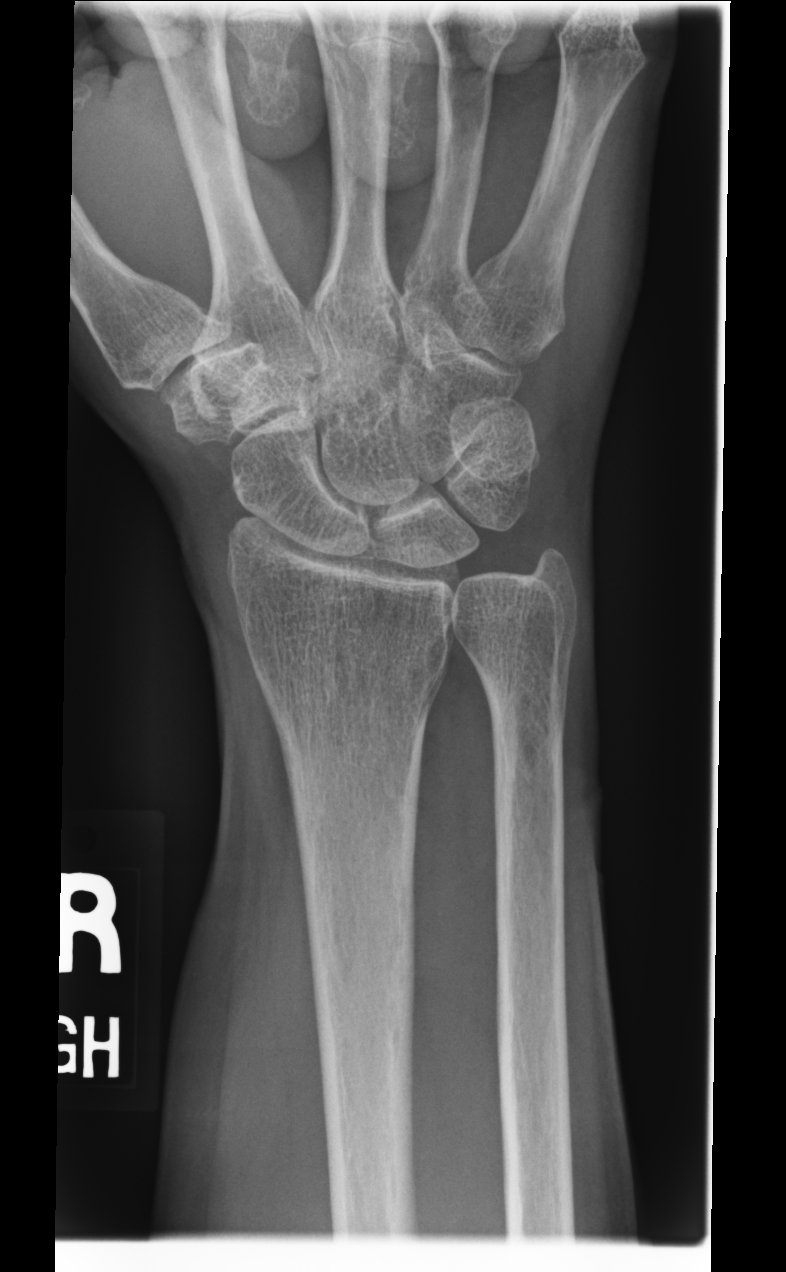

[dg wrist complete right (2 of 4)]
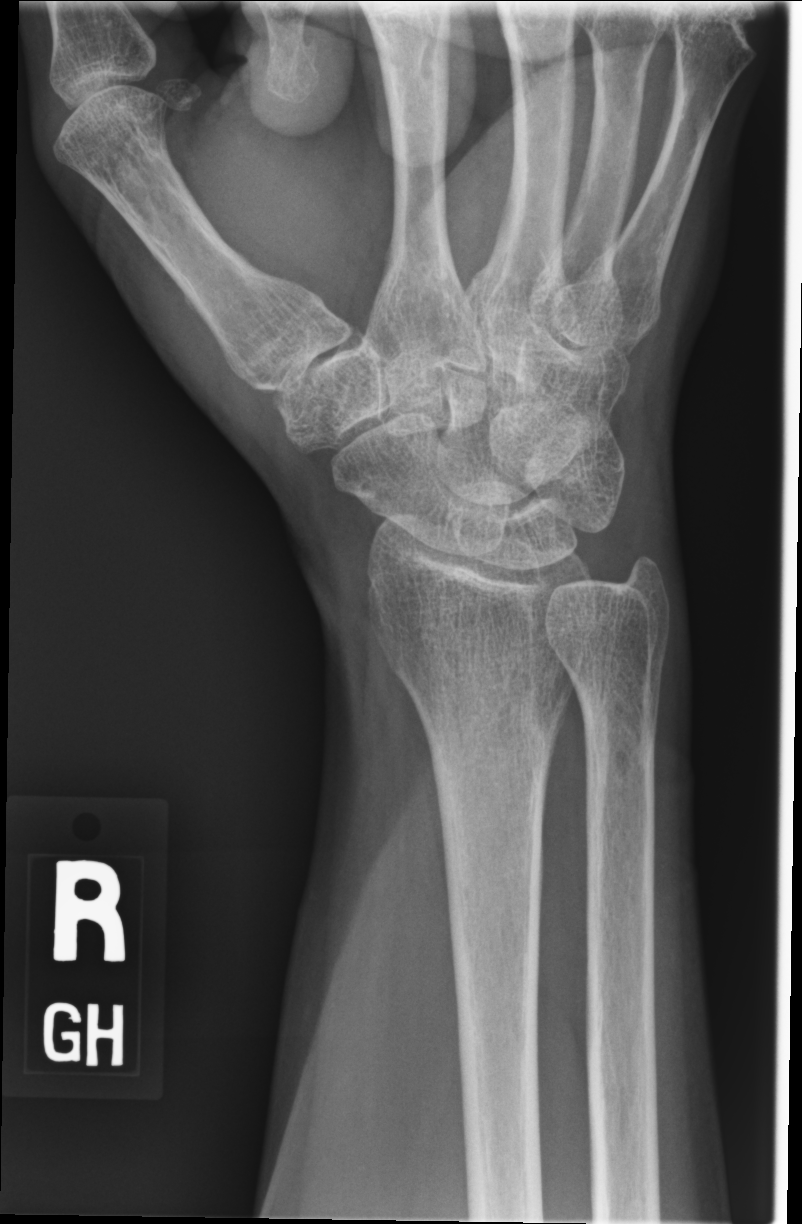

[dg wrist complete right (3 of 4)]
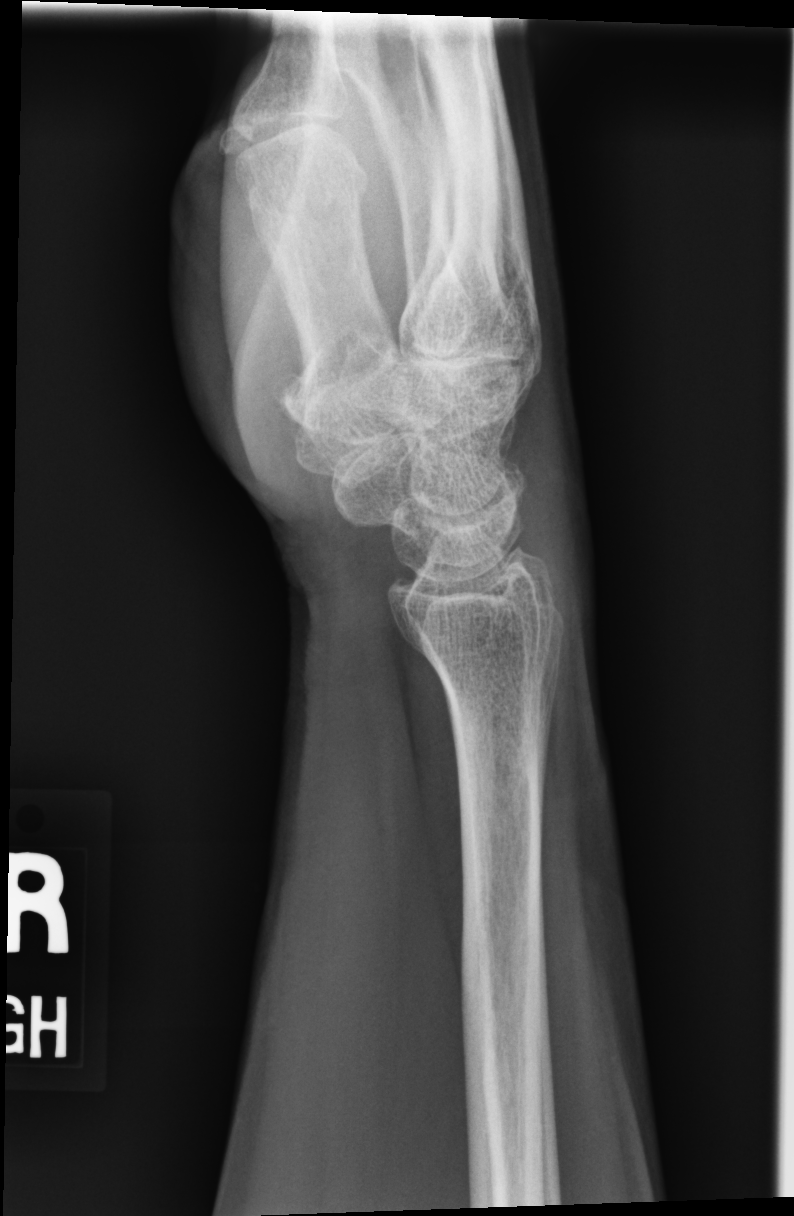

[dg wrist complete right (4 of 4)]
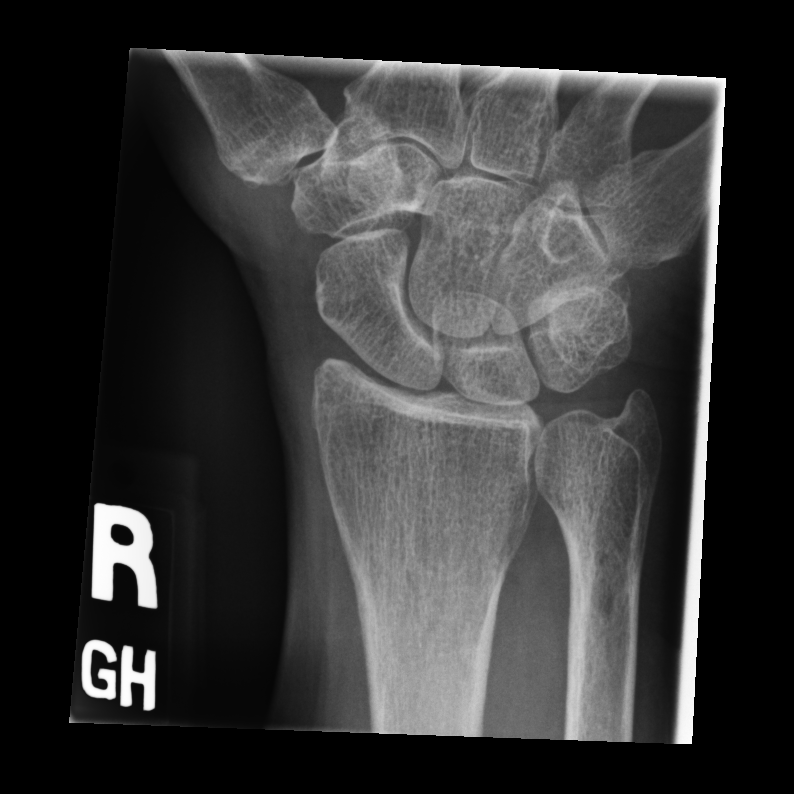

[4 of 4 positions shown; findings below may reference images not displayed]

FINDINGS: Frontal, oblique, lateral, and ulnar deviated views of the right
wrist are obtained. No fracture, subluxation, or dislocation. Mild
osteoarthritis at the first carpometacarpal joint. Soft tissues are
unremarkable.
IMPRESSION: 1. Mild osteoarthritis.  No acute bony abnormality.

## 2023-01-19 ENCOUNTER — Other Ambulatory Visit: Payer: Self-pay | Admitting: Family Medicine

## 2023-01-19 DIAGNOSIS — M5442 Lumbago with sciatica, left side: Secondary | ICD-10-CM

## 2023-01-21 MED ORDER — HYDROCODONE-IBUPROFEN 7.5-200 MG PO TABS
1.0000 | ORAL_TABLET | Freq: Three times a day (TID) | ORAL | 0 refills | Status: DC | PRN
Start: 2023-01-21 — End: 2023-02-20

## 2023-01-21 NOTE — Telephone Encounter (Signed)
Refilled for Dr. Darrick Penna as he is out of the office.

## 2023-02-19 ENCOUNTER — Other Ambulatory Visit: Payer: Self-pay | Admitting: Family Medicine

## 2023-02-19 DIAGNOSIS — M5442 Lumbago with sciatica, left side: Secondary | ICD-10-CM

## 2023-02-20 ENCOUNTER — Other Ambulatory Visit: Payer: Self-pay | Admitting: Family Medicine

## 2023-02-20 DIAGNOSIS — M5442 Lumbago with sciatica, left side: Secondary | ICD-10-CM

## 2023-02-20 MED ORDER — HYDROCODONE-IBUPROFEN 7.5-200 MG PO TABS: 1.0000 | ORAL_TABLET | Freq: Three times a day (TID) | ORAL | 0 refills | Status: DC | PRN

## 2023-02-20 NOTE — Progress Notes (Signed)
 Patient requesting Vicoprofen refill.  Scheduled for visit for med-check next week.  [place] PMP reviewed, no red flags.  Should keep f/u appt as scheduled.

## 2023-02-24 ENCOUNTER — Ambulatory Visit (INDEPENDENT_AMBULATORY_CARE_PROVIDER_SITE_OTHER): Payer: Self-pay | Admitting: Sports Medicine

## 2023-02-24 VITALS — BP 152/100 | Ht 66.0 in | Wt 195.0 lb

## 2023-02-24 DIAGNOSIS — F419 Anxiety disorder, unspecified: Secondary | ICD-10-CM

## 2023-02-24 DIAGNOSIS — M7581 Other shoulder lesions, right shoulder: Secondary | ICD-10-CM | POA: Diagnosis present

## 2023-02-24 MED ORDER — DIAZEPAM 5 MG PO TABS: ORAL_TABLET | ORAL | 1 refills | Status: DC

## 2023-02-24 NOTE — Assessment & Plan Note (Signed)
 Since this is flaring again I asked her to return and we will repeat an ultrasound and evaluate this

## 2023-02-24 NOTE — Assessment & Plan Note (Signed)
 This is a chronic problem but not severe.  I did refill diazepam 30 mg with 1 refill and encouraged limited usage She seems to use this only for rescue when she is having significant problems

## 2023-02-24 NOTE — Progress Notes (Signed)
 Patient ID: Gabriela [name], female   DOB: 1955-11-05, 68 y.o.   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  She will come back for me to do an evaluation and ultrasound on that to see if she is really aggravated some of her older injuries. She has low back pain but only on the days she works and she thinks is the rotation that bothers that Currently she takes gabapentin  as needed when the back is bothersome but she usually breaks up to 600 mg tablet into about thirds and will take a third of a tablet 2-3 times in a day she has stopped the gabapentin  at 300 when she does not take Protonix  40 mg at night because she has esophageal laryngeal reflux when her back spasms she is using an occasional Flexeril  5 mg She uses her diazepam  5 mg usually only when she comes home from a day and is quite anxious and having more back pain  Review of med usage reveals that she has not had access prescriptions or multiple prescriptions at any time  Impression: Medication management for chronic conditions that give her musculoskeletal pain I think she is doing okay with these  Plan I do think she needs to be on some regular back exercises and was given a Williams flexion series to start

## 2023-02-26 ENCOUNTER — Encounter: Payer: 59 | Admitting: Nurse Practitioner

## 2023-03-10 ENCOUNTER — Encounter: Payer: Self-pay | Admitting: Sports Medicine

## 2023-03-10 ENCOUNTER — Ambulatory Visit (INDEPENDENT_AMBULATORY_CARE_PROVIDER_SITE_OTHER): Payer: Medicare Other | Admitting: Sports Medicine

## 2023-03-10 ENCOUNTER — Other Ambulatory Visit: Payer: Self-pay

## 2023-03-10 VITALS — BP 136/88 | Ht 66.0 in | Wt 195.0 lb

## 2023-03-10 DIAGNOSIS — M5442 Lumbago with sciatica, left side: Secondary | ICD-10-CM

## 2023-03-10 DIAGNOSIS — M25511 Pain in right shoulder: Secondary | ICD-10-CM

## 2023-03-10 DIAGNOSIS — G8929 Other chronic pain: Secondary | ICD-10-CM | POA: Diagnosis not present

## 2023-03-10 MED ORDER — MELOXICAM 15 MG PO TABS: 15.0000 mg | ORAL_TABLET | Freq: Every day | ORAL | 2 refills | Status: DC

## 2023-03-10 MED ORDER — HYDROCODONE-IBUPROFEN 7.5-200 MG PO TABS: 1.0000 | ORAL_TABLET | Freq: Three times a day (TID) | ORAL | 0 refills | Status: DC | PRN
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
## 2023-03-24 ENCOUNTER — Encounter: Payer: Self-pay | Admitting: Internal Medicine

## 2023-04-07 ENCOUNTER — Encounter: Payer: Self-pay | Admitting: Sports Medicine

## 2023-04-07 ENCOUNTER — Ambulatory Visit (INDEPENDENT_AMBULATORY_CARE_PROVIDER_SITE_OTHER): Payer: Medicare Other | Admitting: Sports Medicine

## 2023-04-07 VITALS — Ht 66.0 in | Wt 195.0 lb

## 2023-04-07 DIAGNOSIS — M67922 Unspecified disorder of synovium and tendon, left upper arm: Secondary | ICD-10-CM

## 2023-04-07 DIAGNOSIS — G8929 Other chronic pain: Secondary | ICD-10-CM

## 2023-04-07 DIAGNOSIS — M25511 Pain in right shoulder: Secondary | ICD-10-CM

## 2023-04-07 DIAGNOSIS — M5442 Lumbago with sciatica, left side: Secondary | ICD-10-CM | POA: Diagnosis not present

## 2023-04-07 MED ORDER — HYDROCODONE-IBUPROFEN 7.5-200 MG PO TABS: 1.0000 | ORAL_TABLET | Freq: Three times a day (TID) | ORAL | 0 refills | Status: DC | PRN
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
## 2023-04-13 ENCOUNTER — Ambulatory Visit: Payer: Medicare Other | Admitting: Physical Therapy
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
## 2023-04-15 ENCOUNTER — Other Ambulatory Visit: Payer: Self-pay | Admitting: Family Medicine

## 2023-04-15 DIAGNOSIS — M8580 Other specified disorders of bone density and structure, unspecified site: Secondary | ICD-10-CM

## 2023-04-17 ENCOUNTER — Other Ambulatory Visit: Payer: Self-pay | Admitting: Internal Medicine

## 2023-04-28 ENCOUNTER — Other Ambulatory Visit: Payer: Self-pay

## 2023-04-28 ENCOUNTER — Ambulatory Visit: Attending: Sports Medicine

## 2023-04-28 DIAGNOSIS — M25511 Pain in right shoulder: Secondary | ICD-10-CM | POA: Insufficient documentation

## 2023-04-28 DIAGNOSIS — M67922 Unspecified disorder of synovium and tendon, left upper arm: Secondary | ICD-10-CM | POA: Insufficient documentation

## 2023-04-28 DIAGNOSIS — M25512 Pain in left shoulder: Secondary | ICD-10-CM | POA: Diagnosis present

## 2023-04-28 DIAGNOSIS — M6281 Muscle weakness (generalized): Secondary | ICD-10-CM | POA: Diagnosis present

## 2023-04-28 DIAGNOSIS — G8929 Other chronic pain: Secondary | ICD-10-CM | POA: Diagnosis present
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
## 2023-05-01 ENCOUNTER — Encounter: Payer: 59 | Admitting: Nurse Practitioner

## 2023-05-04 ENCOUNTER — Ambulatory Visit: Admitting: Physical Therapy

## 2023-05-06 ENCOUNTER — Other Ambulatory Visit: Payer: Self-pay | Admitting: Sports Medicine

## 2023-05-06 ENCOUNTER — Ambulatory Visit

## 2023-05-06 DIAGNOSIS — G8929 Other chronic pain: Secondary | ICD-10-CM

## 2023-05-06 DIAGNOSIS — M25512 Pain in left shoulder: Secondary | ICD-10-CM | POA: Diagnosis not present

## 2023-05-06 DIAGNOSIS — M5442 Lumbago with sciatica, left side: Secondary | ICD-10-CM

## 2023-05-06 DIAGNOSIS — M6281 Muscle weakness (generalized): Secondary | ICD-10-CM
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
## 2023-05-07 ENCOUNTER — Other Ambulatory Visit: Payer: Self-pay | Admitting: Sports Medicine

## 2023-05-07 DIAGNOSIS — M5442 Lumbago with sciatica, left side: Secondary | ICD-10-CM

## 2023-05-07 MED ORDER — HYDROCODONE-IBUPROFEN 7.5-200 MG PO TABS: 1.0000 | ORAL_TABLET | Freq: Three times a day (TID) | ORAL | 0 refills | Status: DC | PRN

## 2023-05-08 ENCOUNTER — Encounter: Payer: Self-pay | Admitting: Physical Therapy

## 2023-05-08 ENCOUNTER — Ambulatory Visit: Admitting: Physical Therapy

## 2023-05-08 DIAGNOSIS — M25512 Pain in left shoulder: Secondary | ICD-10-CM | POA: Diagnosis not present

## 2023-05-08 DIAGNOSIS — G8929 Other chronic pain: Secondary | ICD-10-CM

## 2023-05-08 DIAGNOSIS — M6281 Muscle weakness (generalized): Secondary | ICD-10-CM
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
## 2023-05-12 ENCOUNTER — Ambulatory Visit: Attending: Sports Medicine

## 2023-05-12 DIAGNOSIS — M25512 Pain in left shoulder: Secondary | ICD-10-CM | POA: Diagnosis present

## 2023-05-12 DIAGNOSIS — G8929 Other chronic pain: Secondary | ICD-10-CM | POA: Insufficient documentation

## 2023-05-12 DIAGNOSIS — M25511 Pain in right shoulder: Secondary | ICD-10-CM | POA: Diagnosis present

## 2023-05-12 DIAGNOSIS — M6281 Muscle weakness (generalized): Secondary | ICD-10-CM | POA: Insufficient documentation
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
## 2023-05-14 ENCOUNTER — Ambulatory Visit

## 2023-05-14 DIAGNOSIS — G8929 Other chronic pain: Secondary | ICD-10-CM

## 2023-05-14 DIAGNOSIS — M25512 Pain in left shoulder: Secondary | ICD-10-CM | POA: Diagnosis not present

## 2023-05-14 DIAGNOSIS — M6281 Muscle weakness (generalized): Secondary | ICD-10-CM

## 2023-05-18 ENCOUNTER — Encounter: Payer: Self-pay | Admitting: Sports Medicine
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
## 2023-05-19 ENCOUNTER — Ambulatory Visit (INDEPENDENT_AMBULATORY_CARE_PROVIDER_SITE_OTHER): Payer: Medicare Other | Admitting: Sports Medicine

## 2023-05-19 ENCOUNTER — Ambulatory Visit

## 2023-05-19 VITALS — BP 164/97 | Ht 66.0 in | Wt 194.0 lb

## 2023-05-19 DIAGNOSIS — M25512 Pain in left shoulder: Secondary | ICD-10-CM | POA: Diagnosis not present

## 2023-05-19 DIAGNOSIS — G8929 Other chronic pain: Secondary | ICD-10-CM

## 2023-05-19 DIAGNOSIS — M6281 Muscle weakness (generalized): Secondary | ICD-10-CM

## 2023-05-19 DIAGNOSIS — M7581 Other shoulder lesions, right shoulder: Secondary | ICD-10-CM | POA: Diagnosis not present

## 2023-05-19 NOTE — Assessment & Plan Note (Signed)
 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  For that reason I want to extend her time out of work which requires repetitive right arm activity until mid May when she will have had a chance to complete her physical therapy.  I would note that she shows some signs of healing of the rotator cuff tears that we saw in 2023. She has also had a right sided disc rupture at C5-C6 which could have contributed to the innervation of that shoulder and if that is affected may have increased her risk for this tendon tear and her previous rotator cuff tears  We will see her in 1 month to see if we can clear her to return to her normal work activities

## 2023-05-19 NOTE — Progress Notes (Signed)
 Chief complaint: Follow-up of right biceps tendon complete rupture and some arthritis of the right shoulder  Ultrasound in our office on January 28 showed a complete rupture of her right biceps tendon that was in the process of scarring down. The remaining tendon would sublux from the groove. There was also AC joint arthritis  On February 25 the patient had only shown limited benefit from treatment but was still trying to work.  We took her out of work activities since they required using her arm repetitively.  We sent her to physical therapy and suggested no return to work until we have completed a reasonable course of physical therapy.  Today she comes in with improvement in pain and some improvement in strength.  However she has only had a few sessions of physical therapy because of scheduling difficulties.  This clearly does seem to be helping but she needs to complete a full course to lessen the risk of reinjury  Physical exam Pleasant female in no acute distress BP (!) 164/97   Ht 5\' 6"  (1.676 m)   Wt 194 lb (88 kg)   LMP 02/10/2009 (Approximate)   BMI 31.31 kg/m   Shoulder: Right Inspection reveals no abnormalities, atrophy or asymmetry. Palpation with mild tenderness over AC joint or bicipital groove. ROM is full in all planes. Rotator cuff strength normal throughout. No signs of impingement with negative Neer and Hawkin's tests, empty can. Speeds and Yergason's tests normal. No labral pathology noted with negative Obrien's, negative clunk and good stability. Normal scapular function observed. No painful arc and no drop arm sign. No apprehension sign  Today bicep strength seems to be improved as does overall shoulder strength She does have some neck pain particularly the left side on evaluation
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
## 2023-05-22 ENCOUNTER — Ambulatory Visit

## 2023-05-22 DIAGNOSIS — M6281 Muscle weakness (generalized): Secondary | ICD-10-CM

## 2023-05-22 DIAGNOSIS — G8929 Other chronic pain: Secondary | ICD-10-CM

## 2023-05-22 DIAGNOSIS — M25512 Pain in left shoulder: Secondary | ICD-10-CM | POA: Diagnosis not present
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
## 2023-05-26 ENCOUNTER — Ambulatory Visit

## 2023-05-26 DIAGNOSIS — M25512 Pain in left shoulder: Secondary | ICD-10-CM | POA: Diagnosis not present

## 2023-05-26 DIAGNOSIS — G8929 Other chronic pain: Secondary | ICD-10-CM

## 2023-05-26 DIAGNOSIS — M6281 Muscle weakness (generalized): Secondary | ICD-10-CM
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
## 2023-05-28 ENCOUNTER — Ambulatory Visit

## 2023-05-28 ENCOUNTER — Encounter: Payer: Self-pay | Admitting: Sports Medicine

## 2023-05-28 ENCOUNTER — Telehealth: Payer: Self-pay

## 2023-05-28 DIAGNOSIS — M6281 Muscle weakness (generalized): Secondary | ICD-10-CM

## 2023-05-28 DIAGNOSIS — M25512 Pain in left shoulder: Secondary | ICD-10-CM | POA: Diagnosis not present

## 2023-05-28 DIAGNOSIS — G8929 Other chronic pain: Secondary | ICD-10-CM

## 2023-05-28 NOTE — Telephone Encounter (Signed)
 Pt c/o left hand tremor since she finished PT today around 12:15 pm. It's on and off and made worse when she flexes her wrist. She's also c/o left sided neck pain. Asking for advice.  Per Dr. Nolene Baumgarten - take her usual pain medicine, ice the area of her neck/shoulder and take it easy as this should resolve in the next few hours. Likely overstretched a nerve during PT session today. He would like her to note how she does over the next few PT sessions and if she continues to have issues then we may need to see her back and re-evaluate/adjust PT going forward.
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
## 2023-06-01 ENCOUNTER — Ambulatory Visit
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
## 2023-06-03 ENCOUNTER — Ambulatory Visit

## 2023-06-03 ENCOUNTER — Other Ambulatory Visit: Payer: Self-pay | Admitting: Sports Medicine

## 2023-06-03 ENCOUNTER — Encounter: Payer: Self-pay | Admitting: Sports Medicine

## 2023-06-03 DIAGNOSIS — M6281 Muscle weakness (generalized): Secondary | ICD-10-CM

## 2023-06-03 DIAGNOSIS — G8929 Other chronic pain: Secondary | ICD-10-CM

## 2023-06-03 DIAGNOSIS — M25512 Pain in left shoulder: Secondary | ICD-10-CM | POA: Diagnosis not present
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
## 2023-06-08 ENCOUNTER — Other Ambulatory Visit: Payer: Self-pay | Admitting: Sports Medicine

## 2023-06-08 ENCOUNTER — Ambulatory Visit

## 2023-06-08 DIAGNOSIS — M6281 Muscle weakness (generalized): Secondary | ICD-10-CM

## 2023-06-08 DIAGNOSIS — G8929 Other chronic pain: Secondary | ICD-10-CM

## 2023-06-08 DIAGNOSIS — M5442 Lumbago with sciatica, left side: Secondary | ICD-10-CM

## 2023-06-08 DIAGNOSIS — M25512 Pain in left shoulder: Secondary | ICD-10-CM | POA: Diagnosis not present
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
## 2023-06-10 ENCOUNTER — Ambulatory Visit

## 2023-06-11 ENCOUNTER — Encounter: Payer: Self-pay | Admitting: Sports Medicine

## 2023-06-11 MED ORDER — HYDROCODONE-IBUPROFEN 7.5-200 MG PO TABS
1.0000 | ORAL_TABLET | Freq: Three times a day (TID) | ORAL | 0 refills | Status: DC | PRN
Start: 2023-06-11 — End: 2023-07-09

## 2023-06-11 NOTE — Telephone Encounter (Signed)
 Refilling medication for Dr. Nolene Baumgarten who is out of the office.

## 2023-06-15 ENCOUNTER — Ambulatory Visit: Attending: Sports Medicine

## 2023-06-15 DIAGNOSIS — G8929 Other chronic pain: Secondary | ICD-10-CM | POA: Diagnosis present

## 2023-06-15 DIAGNOSIS — M25512 Pain in left shoulder: Secondary | ICD-10-CM | POA: Diagnosis present

## 2023-06-15 DIAGNOSIS — M6281 Muscle weakness (generalized): Secondary | ICD-10-CM | POA: Insufficient documentation

## 2023-06-15 DIAGNOSIS — M25511 Pain in right shoulder: Secondary | ICD-10-CM | POA: Insufficient documentation
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
## 2023-06-17 ENCOUNTER — Ambulatory Visit

## 2023-06-17 DIAGNOSIS — G8929 Other chronic pain: Secondary | ICD-10-CM

## 2023-06-17 DIAGNOSIS — M6281 Muscle weakness (generalized): Secondary | ICD-10-CM

## 2023-06-17 DIAGNOSIS — M25512 Pain in left shoulder: Secondary | ICD-10-CM | POA: Diagnosis not present

## 2023-06-18 ENCOUNTER — Ambulatory Visit (INDEPENDENT_AMBULATORY_CARE_PROVIDER_SITE_OTHER): Admitting: Family Medicine

## 2023-06-18 ENCOUNTER — Encounter: Payer: Self-pay | Admitting: Family Medicine

## 2023-06-18 ENCOUNTER — Other Ambulatory Visit: Payer: Self-pay

## 2023-06-18 VITALS — BP 136/86 | Ht 66.0 in | Wt 194.0 lb

## 2023-06-18 DIAGNOSIS — S46211S Strain of muscle, fascia and tendon of other parts of biceps, right arm, sequela: Secondary | ICD-10-CM | POA: Diagnosis present

## 2023-06-18 DIAGNOSIS — G8929 Other chronic pain: Secondary | ICD-10-CM

## 2023-06-18 NOTE — Patient Instructions (Signed)
 [facility]

## 2023-06-18 NOTE — Progress Notes (Signed)
 DATE OF VISIT: 06/18/2023        Gabriela [name] DOB: July 11, 1955 MRN: [id number]  CC:  f/u Rt shoulder  History of present Illness: Gabriela [name] is a 68 y.o. female who presents for a follow-up visit for Rt shoulder Last seen 05/19/23 by Dr Nolene Baumgarten - has proximal bicep tear that is healing on the right - has old supraspinatus tear on the left Has been out of work since 04/09/23 due to injury - works as a TEFL teacher at [employer] - Works in a high [employer] - Typically works 3 days a week up to 8 hours each shift  Since last visit she reports that she has been doing well She reports that pain and strength has been improving -Does not feel 100%, but feeling 75 to 80% improved - Pain at times can be 3-4 out of 10 Has been doing PT  - completed 13 sessions - last session 06/17/23 - last PT note reviewed today She feels she is ready to return to work with a decreased schedule for a short period of time  Medications:  Outpatient Encounter Medications as of 06/18/2023  Medication Sig   Cetirizine HCl (ZYRTEC ALLERGY PO) Take 1 tablet by mouth daily.    cyclobenzaprine  (FLEXERIL ) 5 MG tablet Take 1 tablet (5 mg total) by mouth 3 (three) times daily as needed for muscle spasms.   diazepam  (VALIUM ) 5 MG tablet 1/2 tab at bedtime as needed, for muscle spasms   EPINEPHrine  (EPIPEN  2-PAK) 0.3 mg/0.3 mL DEVI Inject 0.3 mLs (0.3 mg total) into the muscle as needed (anaphylaxis).   gabapentin  (NEURONTIN ) 300 MG capsule Take 1 capsule (300 mg total) by mouth 3 (three) times daily as needed.   gabapentin  (NEURONTIN ) 600 MG tablet Take 1 tablet (600 mg total) by mouth 3 (three) times daily.   HYDROcodone -ibuprofen  (VICOPROFEN ) 7.5-200 MG tablet Take 1 tablet by mouth every 8 (eight) hours as needed for moderate pain (pain score 4-6).   meloxicam  (MOBIC ) 15 MG tablet Take 1 tablet (15 mg total) by mouth daily.   pantoprazole  (PROTONIX ) 40 MG tablet Take 1 tablet (40 mg total) by mouth daily.  Patient needs follow up appointment for future refills. Please call [phone] to schedule an appointment.   No facility-administered encounter medications on file as of 06/18/2023.    Allergies: is allergic to acetaminophen  and nexium  [esomeprazole ].  Physical Examination: Vitals: LMP 02/10/2009 (Approximate)  GENERAL:  Gabriela [name] is a 68 y.o. female appearing their stated age, alert and oriented x 3, in no apparent distress.  SKIN: no rashes or lesions, skin clean, dry, intact MSK: Right shoulder without gross deformity.  Minimal tenderness over the bicipital groove.  No tenderness over the [facility] joint or greater tuberosity.  Near full range of motion in all planes without pain.  Negative Hawkins, negative Neer, negative empty can.  Negative drop arm.  Rotator cuff strength and bicep strength 4/5 throughout Normal grip strength bilaterally Neurovascularly intact distally  Radiology: Limited MSK ultrasound right shoulder Date: 06/18/2023 Indication: Follow-up right proximal long head biceps tendon rupture Findings: - Again noted to have disruption of the proximal aspect of long head of the biceps.  It is not position in the proximal bicipital groove.  Is seen in the distal bicipital groove.  Long axis view show disruption and retraction of the tendon.  Does appear to have some scarring and signs of healing.  No significant surrounding fluid  Impression: Right proximal biceps tendon tear with  signs of scarring and healing Images and interpretation completed by Marvel Slicker, DO today  Assessment & Plan Biceps rupture, proximal, right, sequela Improving pain and strength following prior right proximal biceps tendon rupture - MSK ultrasound does show some signs of healing and scarring, but still disruption of the proximal fibers  Plan: - PT notes reviewed in detail and is making good progress - On exam today he does have improved strength - She would like to return to work on a  limited basis.  I think this would be okay.  She was given note to return to work 2 days a week for up to 6 hours.  Will have these restrictions in place for about 4 weeks.  She will follow-up with us  at that time to assess her progress.  If she feels she is doing well within the next 2 weeks she can reach out and let us  know and we could advance her to her regular schedule of 3 days a week up to 8 hours. - In the interim she will continue her PT and exercises as she is doing - Follow-up with me or Dr. Nolene Baumgarten in about 4 weeks to reevaluate, sooner as needed   Patient expressed understanding & agreement with above.  No diagnosis found.  No orders of the defined types were placed in this encounter.
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
## 2023-06-22 ENCOUNTER — Ambulatory Visit

## 2023-06-22 DIAGNOSIS — G8929 Other chronic pain: Secondary | ICD-10-CM

## 2023-06-22 DIAGNOSIS — M25512 Pain in left shoulder: Secondary | ICD-10-CM | POA: Diagnosis not present

## 2023-06-22 DIAGNOSIS — M6281 Muscle weakness (generalized): Secondary | ICD-10-CM
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
## 2023-06-24 ENCOUNTER — Ambulatory Visit

## 2023-06-24 DIAGNOSIS — M25512 Pain in left shoulder: Secondary | ICD-10-CM | POA: Diagnosis not present

## 2023-06-24 DIAGNOSIS — G8929 Other chronic pain: Secondary | ICD-10-CM

## 2023-06-24 DIAGNOSIS — M6281 Muscle weakness (generalized): Secondary | ICD-10-CM

## 2023-06-24 NOTE — Addendum Note (Signed)
 Addended by: Waleska Buttery M on: 06/24/2023 12:53 PM   Modules accepted: Orders

## 2023-07-09 ENCOUNTER — Other Ambulatory Visit: Payer: Self-pay | Admitting: Family Medicine

## 2023-07-09 DIAGNOSIS — M5442 Lumbago with sciatica, left side: Secondary | ICD-10-CM

## 2023-07-09 MED ORDER — HYDROCODONE-IBUPROFEN 7.5-200 MG PO TABS: 1.0000 | ORAL_TABLET | Freq: Three times a day (TID) | ORAL | 0 refills | Status: DC | PRN

## 2023-07-13 ENCOUNTER — Encounter: Payer: Self-pay | Admitting: Family Medicine

## 2023-07-13 ENCOUNTER — Ambulatory Visit (INDEPENDENT_AMBULATORY_CARE_PROVIDER_SITE_OTHER): Admitting: Family Medicine

## 2023-07-13 VITALS — BP 122/86 | Ht 66.0 in | Wt 185.0 lb

## 2023-07-13 DIAGNOSIS — S46211S Strain of muscle, fascia and tendon of other parts of biceps, right arm, sequela: Secondary | ICD-10-CM

## 2023-07-13 NOTE — Progress Notes (Signed)
 DATE OF VISIT: 07/13/2023        Gabriela [name] DOB: 1955-11-23 MRN: [id number]  CC:  f/u Rt shoulder  History of present Illness: Gabriela [name] is a 68 y.o. female who presents for a follow-up visit for Rt shoulder Last seen by me 06/18/23 - has RT proximal bicep tear  - has been doing PT -- last session 06/24/23 - has been back to work at [employer] since last visit -- working 2 days/wk up to [employer] (typically works 3 days/wk up to [employer])  Since last visit reports doing well Has not been doing HEP regularly Feels would like to start HEP as opposed to returning to PT Work is going well, but some days having some pain Would like to continue her restrictions for an additional 8-weeks  Has been doing some planting at home around her pool this weekend Some discomfort related to this  Medications:  Outpatient Encounter Medications as of 07/13/2023  Medication Sig   Cetirizine HCl (ZYRTEC ALLERGY PO) Take 1 tablet by mouth daily.    cyclobenzaprine  (FLEXERIL ) 5 MG tablet Take 1 tablet (5 mg total) by mouth 3 (three) times daily as needed for muscle spasms.   diazepam  (VALIUM ) 5 MG tablet 1/2 tab at bedtime as needed, for muscle spasms   EPINEPHrine  (EPIPEN  2-PAK) 0.3 mg/0.3 mL DEVI Inject 0.3 mLs (0.3 mg total) into the muscle as needed (anaphylaxis).   gabapentin  (NEURONTIN ) 300 MG capsule Take 1 capsule (300 mg total) by mouth 3 (three) times daily as needed.   gabapentin  (NEURONTIN ) 600 MG tablet Take 1 tablet (600 mg total) by mouth 3 (three) times daily.   HYDROcodone -ibuprofen  (VICOPROFEN ) 7.5-200 MG tablet Take 1 tablet by mouth every 8 (eight) hours as needed for moderate pain (pain score 4-6).   meloxicam  (MOBIC ) 15 MG tablet Take 1 tablet (15 mg total) by mouth daily.   pantoprazole  (PROTONIX ) 40 MG tablet Take 1 tablet (40 mg total) by mouth daily. Patient needs follow up appointment for future refills. Please call [phone] to schedule an appointment.   No  facility-administered encounter medications on file as of 07/13/2023.    Allergies: is allergic to acetaminophen  and nexium  [esomeprazole ].  Physical Examination: Vitals: BP 122/86 (BP Location: Left Arm, Patient Position: Sitting, Cuff Size: Normal)   Ht 5\' 6"  (1.676 m)   Wt 185 lb (83.9 kg)   LMP 02/10/2009 (Approximate)   BMI 29.86 kg/m  GENERAL:  Gabriela [name] is a 68 y.o. female appearing their stated age, alert and oriented x 3, in no apparent distress.  SKIN: no rashes or lesions, skin clean, dry, intact MSK: Right shoulder without gross deformity.  Minimal tenderness over the bicipital groove and the greater tuberosity.  No tenderness of the AC joint.  Near full range of motion in all planes without pain.  Negative Hawkins, negative Neer, negative empty can.  Negative drop arm.  Positive speeds.  Rotator cuff strength 5 -/5 throughout.  Bicep strength 4/5.  Normal grip strength bilaterally Neurovascularly intact distally  Assessment & Plan Biceps rupture, proximal, right, sequela Improving pain, strength, mobility following prior right proximal biceps tendon rupture  Plan: - Has been doing well with PT, prior PT notes reviewed - Recommend she try to institute her HEP at least every other day, even more days if possible - Agree that it would be okay to extend her work restrictions.  Note given today to extend her work restrictions for an additional 8 weeks from the last note  she was given that started a 4-week restriction on 06/25/2023.  New restrictions will go through 09/17/2023 with working 2 days a week no more than 6 hours a day. - She will follow-up with us  in 4 to 6 weeks if ongoing issues or no improvement, sooner as needed   Patient expressed understanding & agreement with above.  Encounter Diagnosis  Name Primary?   Biceps rupture, proximal, right, sequela Yes    No orders of the defined types were placed in this encounter.

## 2023-07-15 ENCOUNTER — Other Ambulatory Visit (HOSPITAL_BASED_OUTPATIENT_CLINIC_OR_DEPARTMENT_OTHER): Payer: Self-pay | Admitting: Family Medicine

## 2023-07-15 DIAGNOSIS — E782 Mixed hyperlipidemia: Secondary | ICD-10-CM

## 2023-07-16 ENCOUNTER — Ambulatory Visit: Admitting: Family Medicine

## 2023-07-18 ENCOUNTER — Other Ambulatory Visit: Payer: Self-pay | Admitting: Internal Medicine

## 2023-07-23 ENCOUNTER — Ambulatory Visit: Admitting: Family Medicine

## 2023-07-23 ENCOUNTER — Other Ambulatory Visit: Payer: Self-pay

## 2023-07-23 ENCOUNTER — Encounter: Payer: Self-pay | Admitting: Family Medicine

## 2023-07-23 VITALS — BP 128/84 | Ht 66.0 in | Wt 194.0 lb

## 2023-07-23 DIAGNOSIS — M25561 Pain in right knee: Secondary | ICD-10-CM | POA: Diagnosis present

## 2023-07-23 NOTE — Progress Notes (Signed)
   PCP: Vangie Genet, MD  SUBJECTIVE:   HPI:  Patient is a 68 y.o. female here with chief complaint of right knee instability.  She notes 1 day of worsening instability of the right knee.  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  Today at work she had a twisting motion that brought her down to her knees.  Since she has had some right lateral knee pain that she iced prior to coming in.  Denies any catching locking sensation or significant swelling.  She has not taken any medications for it.  She reports a previous arthroscopic knee surgery many years ago but cannot recall which knee it was on.  Pertinent ROS were reviewed with the patient and found to be negative unless otherwise specified above in HPI.   PERTINENT  PMH / PSH / FH / SH:  Past Medical, Surgical, Social, and Family History Reviewed & Updated in the EMR.  Pertinent findings include:  Chronic pain on Vicoprofen   Allergies  Allergen Reactions   Acetaminophen  Nausea Only   Nexium  [Esomeprazole ] Hives and Rash    OBJECTIVE:  BP 128/84   Ht 5' 6 (1.676 m)   Wt 194 lb (88 kg)   LMP 02/10/2009 (Approximate)   BMI 31.31 kg/m   PHYSICAL EXAM:  GEN: Alert and Oriented, NAD, comfortable in exam room RESP: Unlabored respirations, symmetric chest rise PSY: normal mood, congruent affect   RIGHT KNEE MSK EXAM: No gross deformity, ecchymoses, swelling. TTP along lateral joint line, mildly along medial joint line. No quad, patella, or patellar tendon pain. No posterior pain.Aaron Aas FROM with normal strength. Negative ant/post drawers. Negative valgus/varus testing. Negative lachman. Negative mcmurrays and thessaly. NV intact distally.  Limited MSK Ultrasound of the Right Knee No effusion in the suprapatellar pouch Patellar and quadriceps tendons are normal Lateral meniscus with protrusion and degenerative tearing as  well as moderate narrowing and spurring along the lateral joint line Medial meniscus is intact with fairly well preserved joint line MCL and LCL visualized and intact.  Impression: Right knee degenerative lateral meniscus tear. U/S performed and interpreted by Lin Rend, MD.    Assessment & Plan Acute pain of right knee Acute on chronic issue for her.  She has some degenerative tearing of the lateral meniscus on ultrasound though reassuring does not have significant pain with provocative testing.  She does have some instability sensation as discussed above.  Remainder of her ligament exam is normal.  Plan: -Discussed activity modification in the acute phase as well as encourage continued light exercise -Home exercises focused on quadricep and hamstring strengthening reviewed -Discussed compression sleeve use - As needed follow-up, consider MRI if instability sensation is worsening, develops effusion, or ligament exam changes Patient's questions were answered and they are in agreement with this plan.   Lin Rend, MD PGY-4, Sports Medicine Fellow [facility]

## 2023-07-23 NOTE — Addendum Note (Signed)
 Addended by: Rodgers Clack on: 07/23/2023 05:33 PM   Modules accepted: Level of Service

## 2023-08-06 ENCOUNTER — Other Ambulatory Visit: Payer: Self-pay | Admitting: Sports Medicine

## 2023-08-06 DIAGNOSIS — M5442 Lumbago with sciatica, left side: Secondary | ICD-10-CM

## 2023-08-06 MED ORDER — HYDROCODONE-IBUPROFEN 7.5-200 MG PO TABS: 1.0000 | ORAL_TABLET | Freq: Three times a day (TID) | ORAL | 0 refills | Status: DC | PRN

## 2023-08-11 ENCOUNTER — Ambulatory Visit (HOSPITAL_BASED_OUTPATIENT_CLINIC_OR_DEPARTMENT_OTHER)
Admission: RE | Admit: 2023-08-11 | Discharge: 2023-08-11 | Disposition: A | Discharge status: Home / Self Care | Payer: Self-pay | Source: Ambulatory Visit | Attending: Family Medicine | Admitting: Family Medicine

## 2023-08-11 DIAGNOSIS — E782 Mixed hyperlipidemia: Secondary | ICD-10-CM | POA: Insufficient documentation

## 2023-09-02 ENCOUNTER — Encounter: Payer: Self-pay | Admitting: Sports Medicine

## 2023-09-02 ENCOUNTER — Other Ambulatory Visit: Payer: Self-pay

## 2023-09-02 DIAGNOSIS — M5442 Lumbago with sciatica, left side: Secondary | ICD-10-CM

## 2023-09-03 ENCOUNTER — Other Ambulatory Visit: Payer: Self-pay | Admitting: Sports Medicine

## 2023-09-03 DIAGNOSIS — M5442 Lumbago with sciatica, left side: Secondary | ICD-10-CM

## 2023-09-03 MED ORDER — HYDROCODONE-IBUPROFEN 7.5-200 MG PO TABS: 1.0000 | ORAL_TABLET | Freq: Three times a day (TID) | ORAL | 0 refills | Status: DC | PRN

## 2023-10-01 ENCOUNTER — Encounter: Payer: Self-pay | Admitting: Sports Medicine

## 2023-10-01 ENCOUNTER — Other Ambulatory Visit: Payer: Self-pay | Admitting: Sports Medicine

## 2023-10-01 DIAGNOSIS — M5442 Lumbago with sciatica, left side: Secondary | ICD-10-CM

## 2023-10-01 MED ORDER — HYDROCODONE-IBUPROFEN 7.5-200 MG PO TABS
1.0000 | ORAL_TABLET | Freq: Three times a day (TID) | ORAL | 0 refills | Status: DC | PRN
Start: 2023-10-01 — End: 2023-10-27

## 2023-10-27 ENCOUNTER — Other Ambulatory Visit: Payer: Self-pay | Admitting: Sports Medicine

## 2023-10-27 ENCOUNTER — Encounter: Payer: Self-pay | Admitting: Sports Medicine

## 2023-10-27 DIAGNOSIS — M5442 Lumbago with sciatica, left side: Secondary | ICD-10-CM

## 2023-10-27 MED ORDER — HYDROCODONE-IBUPROFEN 7.5-200 MG PO TABS
1.0000 | ORAL_TABLET | Freq: Three times a day (TID) | ORAL | 0 refills | Status: DC | PRN
Start: 2023-10-27 — End: 2023-11-26

## 2023-11-25 ENCOUNTER — Encounter: Payer: Self-pay | Admitting: Sports Medicine

## 2023-11-25 DIAGNOSIS — F419 Anxiety disorder, unspecified: Secondary | ICD-10-CM

## 2023-11-25 DIAGNOSIS — M5442 Lumbago with sciatica, left side: Secondary | ICD-10-CM

## 2023-11-26 MED ORDER — DIAZEPAM 5 MG PO TABS: ORAL_TABLET | ORAL | 1 refills | Status: DC

## 2023-11-26 MED ORDER — HYDROCODONE-IBUPROFEN 7.5-200 MG PO TABS: 1.0000 | ORAL_TABLET | Freq: Three times a day (TID) | ORAL | 0 refills | Status: DC | PRN

## 2023-12-24 ENCOUNTER — Encounter: Payer: Self-pay | Admitting: Sports Medicine

## 2023-12-24 ENCOUNTER — Other Ambulatory Visit: Payer: Self-pay | Admitting: Sports Medicine

## 2023-12-24 DIAGNOSIS — M5442 Lumbago with sciatica, left side: Secondary | ICD-10-CM

## 2023-12-24 MED ORDER — HYDROCODONE-IBUPROFEN 7.5-200 MG PO TABS
1.0000 | ORAL_TABLET | Freq: Three times a day (TID) | ORAL | 0 refills | Status: AC | PRN
Start: 2023-12-24 — End: ?

## 2023-12-24 NOTE — Progress Notes (Signed)
 Refill- Hydrocodone

## 2024-01-05 ENCOUNTER — Other Ambulatory Visit (HOSPITAL_BASED_OUTPATIENT_CLINIC_OR_DEPARTMENT_OTHER): Payer: Self-pay | Admitting: Family Medicine

## 2024-01-05 DIAGNOSIS — Z780 Asymptomatic menopausal state: Secondary | ICD-10-CM

## 2024-01-20 ENCOUNTER — Encounter: Payer: Self-pay | Admitting: Sports Medicine

## 2024-01-21 ENCOUNTER — Other Ambulatory Visit: Payer: Self-pay | Admitting: Sports Medicine

## 2024-01-21 DIAGNOSIS — M5442 Lumbago with sciatica, left side: Secondary | ICD-10-CM

## 2024-01-21 MED ORDER — HYDROCODONE-IBUPROFEN 7.5-200 MG PO TABS: 1.0000 | ORAL_TABLET | Freq: Three times a day (TID) | ORAL | 0 refills | Status: DC | PRN

## 2024-01-21 NOTE — Progress Notes (Signed)
Refill hydrocodone  

## 2024-02-09 ENCOUNTER — Other Ambulatory Visit: Payer: Self-pay

## 2024-02-10 ENCOUNTER — Telehealth: Payer: Self-pay | Admitting: *Deleted

## 2024-02-10 LAB — SURGICAL PATHOLOGY

## 2024-02-10 NOTE — Telephone Encounter (Signed)
 Spoke to patient to confirm upcoming afternoon [facility] clinic appointment on 1/14, paperwork will be sent via [facility] location and time, also informed patient that the surgeon's office would be calling as well to get information from them similar to the packet that they will be receiving so make sure to do both.  Reminded patient that all providers will be coming to the clinic to see them HERE and if they had any questions to not hesitate to reach back out to myself or their navigators.

## 2024-02-15 ENCOUNTER — Encounter: Payer: Self-pay | Admitting: Genetic Counselor

## 2024-02-15 DIAGNOSIS — Z170 Estrogen receptor positive status [ER+]: Secondary | ICD-10-CM | POA: Insufficient documentation

## 2024-02-16 ENCOUNTER — Other Ambulatory Visit: Payer: Self-pay | Admitting: Sports Medicine

## 2024-02-16 DIAGNOSIS — F419 Anxiety disorder, unspecified: Secondary | ICD-10-CM

## 2024-02-16 DIAGNOSIS — M5442 Lumbago with sciatica, left side: Secondary | ICD-10-CM

## 2024-02-16 MED ORDER — HYDROCODONE-IBUPROFEN 7.5-200 MG PO TABS: 1.0000 | ORAL_TABLET | Freq: Three times a day (TID) | ORAL | 0 refills | Status: DC | PRN

## 2024-02-16 MED ORDER — DIAZEPAM 5 MG PO TABS: ORAL_TABLET | ORAL | 2 refills | Status: AC
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
## 2024-02-17 ENCOUNTER — Encounter: Payer: Self-pay | Admitting: Genetic Counselor

## 2024-02-17 ENCOUNTER — Inpatient Hospital Stay (HOSPITAL_BASED_OUTPATIENT_CLINIC_OR_DEPARTMENT_OTHER): Admitting: Hematology and Oncology

## 2024-02-17 ENCOUNTER — Other Ambulatory Visit: Payer: Self-pay | Admitting: General Surgery

## 2024-02-17 ENCOUNTER — Inpatient Hospital Stay: Attending: Hematology and Oncology

## 2024-02-17 ENCOUNTER — Other Ambulatory Visit: Payer: Self-pay

## 2024-02-17 ENCOUNTER — Encounter: Payer: Self-pay | Admitting: *Deleted

## 2024-02-17 ENCOUNTER — Ambulatory Visit
Admission: RE | Admit: 2024-02-17 | Discharge: 2024-02-17 | Disposition: A | Discharge status: Home / Self Care | Source: Ambulatory Visit | Attending: Radiation Oncology | Admitting: Radiation Oncology

## 2024-02-17 ENCOUNTER — Inpatient Hospital Stay: Admitting: Genetic Counselor

## 2024-02-17 ENCOUNTER — Ambulatory Visit: Admitting: Physical Therapy

## 2024-02-17 VITALS — BP 171/94 | HR 85 | Temp 98.1°F | Resp 18 | Wt 191.7 lb

## 2024-02-17 DIAGNOSIS — Z1732 Human epidermal growth factor receptor 2 negative status: Secondary | ICD-10-CM | POA: Diagnosis not present

## 2024-02-17 DIAGNOSIS — Z833 Family history of diabetes mellitus: Secondary | ICD-10-CM

## 2024-02-17 DIAGNOSIS — Z79899 Other long term (current) drug therapy: Secondary | ICD-10-CM | POA: Diagnosis not present

## 2024-02-17 DIAGNOSIS — Z9079 Acquired absence of other genital organ(s): Secondary | ICD-10-CM

## 2024-02-17 DIAGNOSIS — Z801 Family history of malignant neoplasm of trachea, bronchus and lung: Secondary | ICD-10-CM | POA: Insufficient documentation

## 2024-02-17 DIAGNOSIS — Z1721 Progesterone receptor positive status: Secondary | ICD-10-CM | POA: Diagnosis not present

## 2024-02-17 DIAGNOSIS — Z8379 Family history of other diseases of the digestive system: Secondary | ICD-10-CM | POA: Diagnosis not present

## 2024-02-17 DIAGNOSIS — Z90721 Acquired absence of ovaries, unilateral: Secondary | ICD-10-CM

## 2024-02-17 DIAGNOSIS — Z825 Family history of asthma and other chronic lower respiratory diseases: Secondary | ICD-10-CM | POA: Insufficient documentation

## 2024-02-17 DIAGNOSIS — C50412 Malignant neoplasm of upper-outer quadrant of left female breast: Secondary | ICD-10-CM | POA: Insufficient documentation

## 2024-02-17 DIAGNOSIS — Z803 Family history of malignant neoplasm of breast: Secondary | ICD-10-CM | POA: Diagnosis not present

## 2024-02-17 DIAGNOSIS — F17210 Nicotine dependence, cigarettes, uncomplicated: Secondary | ICD-10-CM | POA: Insufficient documentation

## 2024-02-17 DIAGNOSIS — C50919 Malignant neoplasm of unspecified site of unspecified female breast: Secondary | ICD-10-CM

## 2024-02-17 DIAGNOSIS — Z170 Estrogen receptor positive status [ER+]: Secondary | ICD-10-CM

## 2024-02-17 LAB — GENETIC SCREENING ORDER

## 2024-02-17 NOTE — Assessment & Plan Note (Signed)
 02/09/2024: Screening mammogram detected left breast mass 12 o'clock position periareolar 0.8 cm, tomography measured 1 cm, axilla negative, biopsy: Grade 2 IDC ER 100%, PR 100%, Ki-67 10%, HER2 negative  Pathology and radiology counseling:Discussed with the patient, the details of pathology including the type of breast cancer,the clinical staging, the significance of ER, PR and HER-2/neu receptors and the implications for treatment. After reviewing the pathology in detail, we proceeded to discuss the different treatment options between surgery, radiation, chemotherapy, antiestrogen therapies.  Recommendations: 1. Breast conserving surgery followed by 2. Oncotype DX testing to determine if chemotherapy would be of any benefit followed by 3. Adjuvant radiation therapy followed by 4. Adjuvant antiestrogen therapy  Oncotype counseling: I discussed Oncotype DX test. I explained to the patient that this is a 21 gene panel to evaluate patient tumors DNA to calculate recurrence score. This would help determine whether patient has high risk or low risk breast cancer. She understands that if her tumor was found to be high risk, she would benefit from systemic chemotherapy. If low risk, no need of chemotherapy.  Return to clinic after surgery to discuss final pathology report and then determine if Oncotype DX testing will need to be sent.
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
## 2024-02-22 ENCOUNTER — Telehealth: Payer: Self-pay

## 2024-02-22 DIAGNOSIS — C50412 Malignant neoplasm of upper-outer quadrant of left female breast: Secondary | ICD-10-CM

## 2024-02-22 NOTE — Telephone Encounter (Signed)
 [employer] 2021-05 - Specimen Collection Study to Evaluate Biomarkers in Subjects with Cancer    Called patient to introduce. Patient did not answer. Left a voicemail message with callback number. Will continue to follow on patient interest.  Laury Quale, MPH  Clinical Research Coordinator

## 2024-02-25 ENCOUNTER — Telehealth: Payer: Self-pay

## 2024-02-25 DIAGNOSIS — Z170 Estrogen receptor positive status [ER+]: Secondary | ICD-10-CM

## 2024-02-25 NOTE — Telephone Encounter (Signed)
 [employer] 2021-05 - Specimen Collection Study to Evaluate Biomarkers in Subjects with Cancer    Attempted to call patient to inform about study. Left voicemail with contact information if patient has any questions.  Laury Quale, MPH  Clinical Research Coordinator

## 2024-02-26 ENCOUNTER — Telehealth: Payer: Self-pay | Admitting: Hematology and Oncology

## 2024-02-26 ENCOUNTER — Telehealth: Payer: Self-pay | Admitting: *Deleted

## 2024-02-26 ENCOUNTER — Encounter: Payer: Self-pay | Admitting: *Deleted

## 2024-02-26 ENCOUNTER — Encounter (HOSPITAL_COMMUNITY): Payer: Self-pay

## 2024-02-26 NOTE — Progress Notes (Addendum)
 PCP - none Cardiologist - none  Chest x-ray - n/a EKG - n/a Stress Test - n/a ECHO - n/a Cardiac Cath - n/a  ICD Pacemaker/Loop - n/a  Sleep Study - n/a  Diabetes - n/a  Aspirin & Blood Thinner Instructions:  n/a  ERAS - clear liquids til 0430 DOS PRE-SURGERY Ensure drink given with instructions  Anesthesia review: Yes. Gabriela [name], Gabriela [name].   Magnetic Marker Breast Seed placement on 02/23/23.  STOP now taking any Aspirin (unless otherwise instructed by your surgeon), Aleve, Naproxen, Ibuprofen , Motrin , Advil , Goody's, BC's, all herbal medications, fish oil, and all vitamins.   Coronavirus Screening Do you have any of the following symptoms:  Cough occasional Fever (>100.62F)  yes/no: No Runny nose yes/no: No Sore throat yes/no: No Difficulty breathing/shortness of breath  yes/no: No  Have you traveled in the last 14 days and where? yes/no: No  Patient verbalized understanding of instructions that were given to them at the PAT appointment. Patient was also instructed that they will need to review over the PAT instructions again at home before surgery.

## 2024-02-26 NOTE — Pre-Procedure Instructions (Signed)
 Surgical Instructions   Your procedure is scheduled on March 03, 2024. Report to [facility] Main Entrance A at 05:30 A.M., then check in with the Admitting office. Any questions or running late day of surgery: call [phone]  Questions prior to your surgery date: call [phone], Monday-Friday, 8am-4pm. If you experience any cold or flu symptoms such as cough, fever, chills, shortness of breath, etc. between now and your scheduled surgery, please notify us  at the above number.     Remember:  Do not eat after midnight the night before your surgery   You may drink clear liquids until 04:30 the morning of your surgery.   Clear liquids allowed are: Water, Non-Citrus Juices (without pulp), Carbonated Beverages, Clear Tea (no milk, honey, etc.), Black Coffee Only (NO MILK, CREAM OR POWDERED CREAMER of any kind), and Gatorade.  Patient Instructions  The night before surgery:  No food after midnight. ONLY clear liquids after midnight  The day of surgery (if you do NOT have diabetes):  Drink ONE (1) Pre-Surgery Clear Ensure by 04:30 the morning of surgery. Drink in one sitting. Do not sip.  This drink was given to you during your hospital  pre-op appointment visit.  Nothing else to drink after completing the  Pre-Surgery Clear Ensure.     Take these medicines the morning of surgery with A SIP OF WATER  pantoprazole  (PROTONIX )  gabapentin  (NEURONTIN )   May take these medicines IF NEEDED: cetirizine (ZYRTEC)  cyclobenzaprine  (FLEXERIL )  triamcinolone  (NASACORT  ALLERGY 24HR)   One week prior to surgery, STOP taking any Aspirin (unless otherwise instructed by your surgeon) Aleve, Naproxen, Ibuprofen , Motrin , Advil , Goody's, BC's, all herbal medications, fish oil, and non-prescription vitamins. This includes your HYDROcodone -ibuprofen  (VICOPROFEN ) .                     Do NOT Smoke (Tobacco/Vaping) for 24 hours prior to your procedure.  If you use a CPAP at night, you may  bring your mask/headgear for your overnight stay.   You will be asked to remove any contacts, glasses, piercing's, hearing aid's, dentures/partials prior to surgery. Please bring cases for these items if needed.    Your surgeon will determine if you are to be admitted or discharged the same day.  Patients discharged the day of surgery will not be allowed to drive home, and someone needs to stay with them for 24 hours.  SURGICAL WAITING ROOM VISITATION Patients may have no more than 2 support people in the waiting area - these visitors may rotate.   Pre-op nurse will coordinate an appropriate time for 2 ADULT support persons, who may not rotate, to accompany patient in pre-op.  Children under the age of 46 must have an adult with them who is not the patient and must remain in the main waiting area with an adult.  If the patient needs to stay at the hospital during part of their recovery, the visitor guidelines for inpatient rooms apply.  Please refer to the [facility] website for the visitor guidelines for any additional information.   If you received a COVID test during your pre-op visit  it is requested that you wear a mask when out in public, stay away from anyone that may not be feeling well and notify your surgeon if you develop symptoms. If you have been in contact with anyone that has tested positive in the last 10 days please notify you surgeon.      Pre-operative CHG Bathing Instructions  You can play a key role in reducing the risk of infection after surgery. Your skin needs to be as free of germs as possible. You can reduce the number of germs on your skin by washing with CHG (chlorhexidine  gluconate) soap before surgery. CHG is an antiseptic soap that kills germs and continues to kill germs even after washing.   DO NOT use if you have an allergy to chlorhexidine /CHG or antibacterial soaps. If your skin becomes reddened or irritated, stop using the CHG and notify one of our RNs  at [phone].              TAKE A SHOWER THE NIGHT BEFORE SURGERY   Please keep in mind the following:  DO NOT shave, including legs and underarms, 48 hours prior to surgery.   You may shave your face before/day of surgery.  Place clean sheets on your bed the night before surgery Use a clean washcloth (not used since being washed) for shower. DO NOT sleep with pet's night before surgery.  CHG Shower Instructions:  Wash your face and private area with normal soap. If you choose to wash your hair, wash first with your normal shampoo.  After you use shampoo/soap, rinse your hair and body thoroughly to remove shampoo/soap residue.  Turn the water OFF and apply half the bottle of CHG soap to a CLEAN washcloth.  Apply CHG soap ONLY FROM YOUR NECK DOWN TO YOUR TOES (washing for 3-5 minutes)  DO NOT use CHG soap on face, private areas, open wounds, or sores.  Pay special attention to the area where your surgery is being performed.  If you are having back surgery, having someone wash your back for you may be helpful. Wait 2 minutes after CHG soap is applied, then you may rinse off the CHG soap.  Pat dry with a clean towel  Put on clean pajamas    Additional instructions for the day of surgery: If you choose, you may shower the morning of surgery with an antibacterial soap.  DO NOT APPLY any lotions, deodorants, cologne, or perfumes.   Do not wear jewelry or makeup Do not wear nail polish, gel polish, artificial nails, or any other type of covering on natural nails (fingers and toes) Do not bring valuables to the hospital. [facility] is not responsible for valuables/personal belongings. Put on clean/comfortable clothes.  Please brush your teeth.  Ask your nurse before applying any prescription medications to the skin.

## 2024-02-26 NOTE — Telephone Encounter (Signed)
 Left vm for pt about scheduled appt date and time. Encouraged to call back if need to reschedule

## 2024-02-26 NOTE — Telephone Encounter (Signed)
 Left message for a return phone call to follow up from [facility] 1/7 and assess navigation needs.

## 2024-02-29 ENCOUNTER — Telehealth: Payer: Self-pay

## 2024-02-29 ENCOUNTER — Encounter (HOSPITAL_COMMUNITY): Payer: Self-pay

## 2024-02-29 ENCOUNTER — Other Ambulatory Visit: Payer: Self-pay

## 2024-02-29 ENCOUNTER — Encounter (HOSPITAL_COMMUNITY)
Admission: RE | Admit: 2024-02-29 | Discharge: 2024-02-29 | Disposition: A | Source: Ambulatory Visit | Attending: General Surgery

## 2024-02-29 ENCOUNTER — Ambulatory Visit: Payer: Self-pay | Admitting: Genetic Counselor

## 2024-02-29 VITALS — BP 156/90 | HR 82 | Temp 98.5°F | Resp 17 | Ht 66.0 in | Wt 195.4 lb

## 2024-02-29 DIAGNOSIS — F1090 Alcohol use, unspecified, uncomplicated: Secondary | ICD-10-CM | POA: Diagnosis not present

## 2024-02-29 DIAGNOSIS — E785 Hyperlipidemia, unspecified: Secondary | ICD-10-CM | POA: Insufficient documentation

## 2024-02-29 DIAGNOSIS — R7303 Prediabetes: Secondary | ICD-10-CM | POA: Diagnosis not present

## 2024-02-29 DIAGNOSIS — I10 Essential (primary) hypertension: Secondary | ICD-10-CM | POA: Insufficient documentation

## 2024-02-29 DIAGNOSIS — Z87891 Personal history of nicotine dependence: Secondary | ICD-10-CM | POA: Insufficient documentation

## 2024-02-29 DIAGNOSIS — Z1379 Encounter for other screening for genetic and chromosomal anomalies: Secondary | ICD-10-CM | POA: Insufficient documentation

## 2024-02-29 DIAGNOSIS — Z01818 Encounter for other preprocedural examination: Secondary | ICD-10-CM | POA: Diagnosis not present

## 2024-02-29 DIAGNOSIS — Z01810 Encounter for preprocedural cardiovascular examination: Secondary | ICD-10-CM | POA: Diagnosis present

## 2024-02-29 DIAGNOSIS — F4310 Post-traumatic stress disorder, unspecified: Secondary | ICD-10-CM | POA: Diagnosis not present

## 2024-02-29 DIAGNOSIS — K219 Gastro-esophageal reflux disease without esophagitis: Secondary | ICD-10-CM | POA: Insufficient documentation

## 2024-02-29 DIAGNOSIS — Z01812 Encounter for preprocedural laboratory examination: Secondary | ICD-10-CM | POA: Diagnosis present

## 2024-02-29 DIAGNOSIS — C50912 Malignant neoplasm of unspecified site of left female breast: Secondary | ICD-10-CM | POA: Diagnosis not present

## 2024-02-29 DIAGNOSIS — I739 Peripheral vascular disease, unspecified: Secondary | ICD-10-CM | POA: Insufficient documentation

## 2024-02-29 DIAGNOSIS — C50412 Malignant neoplasm of upper-outer quadrant of left female breast: Secondary | ICD-10-CM

## 2024-02-29 HISTORY — DX: Peripheral vascular disease, unspecified: I73.9

## 2024-02-29 HISTORY — DX: Gastro-esophageal reflux disease without esophagitis: K21.9

## 2024-02-29 HISTORY — DX: Hyperlipidemia, unspecified: E78.5

## 2024-02-29 HISTORY — DX: Malignant (primary) neoplasm, unspecified: C80.1

## 2024-02-29 HISTORY — DX: Essential (primary) hypertension: I10

## 2024-02-29 HISTORY — DX: Personal history of other medical treatment: Z92.89

## 2024-02-29 LAB — CBC
HCT: 41.7 % (ref 36.0–46.0)
Hemoglobin: 13.8 g/dL (ref 12.0–15.0)
MCH: 30.2 pg (ref 26.0–34.0)
MCHC: 33.1 g/dL (ref 30.0–36.0)
MCV: 91.2 fL (ref 80.0–100.0)
Platelets: 286 K/uL (ref 150–400)
RBC: 4.57 MIL/uL (ref 3.87–5.11)
RDW: 12.2 % (ref 11.5–15.5)
WBC: 7.1 K/uL (ref 4.0–10.5)
nRBC: 0 % (ref 0.0–0.2)

## 2024-02-29 LAB — BASIC METABOLIC PANEL WITH GFR
Anion gap: 10 (ref 5–15)
BUN: 18 mg/dL (ref 8–23)
CO2: 24 mmol/L (ref 22–32)
Calcium: 10.4 mg/dL — ABNORMAL HIGH (ref 8.9–10.3)
Chloride: 104 mmol/L (ref 98–111)
Creatinine, Ser: 0.73 mg/dL (ref 0.44–1.00)
GFR, Estimated: >60 mL/min
Glucose, Bld: 98 mg/dL (ref 70–99)
Potassium: 4.4 mmol/L (ref 3.5–5.1)
Sodium: 138 mmol/L (ref 135–145)

## 2024-02-29 NOTE — Telephone Encounter (Signed)
 [employer] 2021-05 - Specimen Collection Study to Evaluate Biomarkers in Subjects with Cancer    Called and left voicemail patient may be eligible for research study if interested to hear about study. Left callback number.  Laury Quale, MPH  Clinical Research Coordinator

## 2024-02-29 NOTE — Progress Notes (Signed)
 HPI:  [name] was previously seen in the [facility] due to a personal and family history of cancer and concerns regarding a hereditary predisposition to cancer. Please refer to our prior cancer genetics clinic note for more information regarding our discussion, assessment and recommendations, at the time. [name] recent genetic test results were disclosed to her, as were recommendations warranted by these results. These results and recommendations are discussed in more detail below.  Results were disclosed by telephone on 02/29/24.   CANCER HISTORY:  Oncology History  Malignant neoplasm of upper-outer quadrant of left breast in female, estrogen receptor positive (HCC)  02/09/2024 Initial Diagnosis   Screening mammogram detected left breast mass 12 o'clock position periareolar 0.8 cm, tomography measured 1 cm, axilla negative, biopsy: Grade 2 IDC ER 100%, PR 100%, Ki-67 10%, HER2 negative   02/17/2024 Cancer Staging   Staging form: Breast, AJCC 8th Edition - Clinical: Stage IA (cT1b, cN0, cM0, G2, ER+, PR+, HER2-) - Signed by Gabriela Potts, MD on 02/17/2024 Stage prefix: Initial diagnosis Histologic grading system: 3 grade system   02/26/2024 Genetic Testing   Negative CancerNext-Expanded +RNA. Report date 02/26/24.   The CancerNext-Expanded gene panel offered by [facility] and includes sequencing, rearrangement, and RNA analysis for the following 77 genes: AIP, ALK, APC, ATM, AXIN2, BAP1, BARD1, BMPR1A, BRCA1, BRCA2, BRIP1, CDC73, CDH1, CDK4, CDKN1B, CDKN2A, CEBPA, CHEK2, CTNNA1, DDX41, DICER1, EGFR, EPCAM, ETV6, FH, FLCN, GATA2, GREM1, HOXB13, KIT, LZTR1, MAX, MBD4, MEN1, MET, MITF, MLH1, MSH2, MSH3, MSH6, MUTYH, NF1, NF2, NTHL1, PALB2, PDGFRA, PHOX2B, PMS2, POLD1, POLE, POT1, PRKAR1A, PTCH1, PTEN, RAD51C, RAD51D, RB1, RET, RPS20, RUNX1, SDHA, SDHAF2, SDHB, SDHC, SDHD, SMAD4, SMARCA4, SMARCB1, SMARCE1, STK11, SUFU, TMEM127, TP53, TSC1, TSC2, VHL, WT1.        FAMILY HISTORY:  We obtained a detailed, 4-generation family history.  Significant diagnoses are listed below: Family History  Problem Relation Age of Onset   COPD Mother        dec age 29 multiple med.issues   Diverticulitis Mother    Breast cancer Mother 34       Estrogen induced   Diabetes type II Father        dec age 85 complications diabetes   Lung cancer Father    Colon cancer Neg Hx    Esophageal cancer Neg Hx    Stomach cancer Neg Hx    Rectal cancer Neg Hx     [name] is unaware of previous family history of genetic testing for hereditary cancer risks. Patient's ancestors are of German and [place] descent. There is no reported Ashkenazi Jewish ancestry.      GENETIC TEST RESULTS: Genetic testing reported out on 02/26/24 through the [facility] CancerNext-Expanded +RNAinsight panel found no pathogenic mutations. The CancerNext-Expanded gene panel offered by [facility] and includes sequencing, rearrangement, and RNA analysis for the following 77 genes: AIP, ALK, APC, ATM, AXIN2, BAP1, BARD1, BMPR1A, BRCA1, BRCA2, BRIP1, CDC73, CDH1, CDK4, CDKN1B, CDKN2A, CEBPA, CHEK2, CTNNA1, DDX41, DICER1, EGFR, EPCAM, ETV6, FH, FLCN, GATA2, GREM1, HOXB13, KIT, LZTR1, MAX, MBD4, MEN1, MET, MITF, MLH1, MSH2, MSH3, MSH6, MUTYH, NF1, NF2, NTHL1, PALB2, PDGFRA, PHOX2B, PMS2, POLD1, POLE, POT1, PRKAR1A, PTCH1, PTEN, RAD51C, RAD51D, RB1, RET, RPS20, RUNX1, SDHA, SDHAF2, SDHB, SDHC, SDHD, SMAD4, SMARCA4, SMARCB1, SMARCE1, STK11, SUFU, TMEM127, TP53, TSC1, TSC2, VHL, WT1. The test report has been scanned into EPIC and is located under the Molecular Pathology section of the Results Review tab.  A portion of the result report is  included below for reference.     We discussed with [name] that because current genetic testing is not perfect, it is possible there may be a gene mutation in one of these genes that current testing cannot detect, but that chance is small.  We also discussed, that  there could be another gene that has not yet been discovered, or that we have not yet tested, that is responsible for the cancer diagnoses in the family. It is also possible there is a hereditary cause for the cancer in the family that [name] did not inherit and therefore was not identified in her testing.  Therefore, it is important to remain in touch with cancer genetics in the future so that we can continue to offer [name] the most up to date genetic testing.   ADDITIONAL GENETIC TESTING: We discussed with [name] that her genetic testing was fairly extensive.  If there are genes identified to increase cancer risk that can be analyzed in the future, we would be happy to discuss and coordinate this testing at that time.    CANCER SCREENING RECOMMENDATIONS: [name] test result is considered negative (normal).  This means that we have not identified a hereditary cause for her personal and family history of cancer at this time. Most cancers happen by chance and this negative test suggests that her personal and family history of cancer may fall into this category.    Possible reasons for [name]'s negative genetic test include:  1. There may be a gene mutation in one of these genes that current testing methods cannot detect. The likelihood of this is low, but possible.   2. There could be another gene that has not yet been discovered, or that we have not yet tested, that is responsible for the cancer diagnoses in the family.  3.  There may be no hereditary risk for cancer in the family. The cancers in [name] and/or her family may be sporadic/familial or due to other genetic and environmental factors. 4. It is also possible there is a hereditary cause for the cancer in the family that [name] did not inherit.  Therefore, it is recommended she continue to follow the cancer management and screening guidelines provided by her oncology and primary healthcare  provider. An individual's cancer risk and medical management are not determined by genetic test results alone. Overall cancer risk assessment incorporates additional factors, including personal medical history, family history, and any available genetic information that may result in a personalized plan for cancer prevention and surveillance  Given [name]'s personal and family histories, we must interpret these negative results with some caution.  Families with features suggestive of hereditary risk for cancer tend to have multiple family members with cancer, diagnoses in multiple generations and diagnoses before the age of 55. [name] family exhibits some of these features. Thus, this result may simply reflect our current inability to detect all mutations within these genes or there may be a different gene that has not yet been discovered or tested.   RECOMMENDATIONS FOR FAMILY MEMBERS:  Individuals in this family might be at some increased risk of developing cancer, over the general population risk, simply due to the family history of cancer.  We recommended women in this family have a yearly mammogram beginning at age 53, or 71 years younger than the earliest onset of cancer, an annual clinical breast exam, and perform monthly breast self-exams. Women in this family should also have a  gynecological exam as recommended by their primary provider. All family members should be referred for colonoscopy starting at age 13, or 45 years younger than the earliest onset of cancer.  Other relatives may benefit from completing their own genetic testing, especially if they have been diagnosed with cancer.   FOLLOW-UP: Lastly, we discussed with [name] that cancer genetics is a rapidly advancing field and it is possible that new genetic tests will be appropriate for her and/or her family members in the future. We encouraged her to remain in contact with cancer genetics on an annual basis so we  can update her personal and family histories and let her know of advances in cancer genetics that may benefit this family.   Our contact number was provided. [name] questions were answered to her satisfaction, and she knows she is welcome to call us  at anytime with additional questions or concerns.   Burnard Ogren, MS, [facility] Licensed, [email or web address] [phone]

## 2024-03-01 NOTE — Anesthesia Preprocedure Evaluation (Signed)
 "                                  Anesthesia Evaluation  Patient identified by MRN, date of birth, ID band Patient awake    Reviewed: Allergy & Precautions, NPO status , Patient's Chart, lab work & pertinent test results  Airway Mallampati: II  TM Distance: >3 FB Neck ROM: Full    Dental no notable dental hx.    Pulmonary former smoker   Pulmonary exam normal        Cardiovascular hypertension, + Peripheral Vascular Disease   Rhythm:Regular Rate:Normal  CV: CT Cardiac Calcium  Scoring 08/11/2023: FINDINGS: Coronary arteries: Normal origins.   Coronary Calcium  Score: Left main: 0 Left anterior descending artery: 35.4 Left circumflex artery: 0 Right coronary artery: 0 Total: 35.4 Percentile: 70th   Pericardium: Normal. Aorta: Normal caliber. Non-cardiac: See separate report from [facility].   IMPRESSION: Coronary calcium  score of 35.4. This was 70th percentile for age-, race-, and sex-matched controls (MESA).   RECOMMENDATIONS:... - If CAC is 1 to 54, it is reasonable to initiate statin therapy for patients >=59 years of age. - If CAC is >=100 or >=75th percentile, it is reasonable to initiate statin therapy at any age...      Neuro/Psych   Anxiety Depression    negative neurological ROS     GI/Hepatic Neg liver ROS,GERD  Medicated,,  Endo/Other  negative endocrine ROS    Renal/GU negative Renal ROS  negative genitourinary   Musculoskeletal negative musculoskeletal ROS (+)    Abdominal Normal abdominal exam  (+)   Peds  Hematology  (+) Blood dyscrasia, anemia Lab Results      Component                Value               Date                      WBC                      7.1                 02/29/2024                HGB                      13.8                02/29/2024                HCT                      41.7                02/29/2024                MCV                      91.2                02/29/2024                 PLT                      286  02/29/2024             Lab Results      Component                Value               Date                      NA                       138                 02/29/2024                K                        4.4                 02/29/2024                CO2                      24                  02/29/2024                GLUCOSE                  98                  02/29/2024                BUN                      18                  02/29/2024                CREATININE               0.73                02/29/2024                CALCIUM                   10.4 (H)            02/29/2024                GFR                      80.35               12/04/2021                EGFR                     86                  02/25/2022                GFRNONAA                 >60                 02/29/2024  Anesthesia Other Findings   Reproductive/Obstetrics                              Anesthesia Physical Anesthesia Plan  ASA: 3  Anesthesia Plan: General   Post-op Pain Management:    Induction: Intravenous  PONV Risk Score and Plan: 3 and Ondansetron , Dexamethasone , Midazolam  and Treatment may vary due to age or medical condition  Airway Management Planned: Mask and LMA  Additional Equipment: None  Intra-op Plan:   Post-operative Plan: Extubation in OR  Informed Consent: I have reviewed the patients History and Physical, chart, labs and discussed the procedure including the risks, benefits and alternatives for the proposed anesthesia with the patient or authorized representative who has indicated his/her understanding and acceptance.     Dental advisory given  Plan Discussed with: CRNA  Anesthesia Plan Comments: (PAT note written 03/01/2024 by Rhylan Gross, PA-C.  )         Anesthesia Quick Evaluation  "
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
## 2024-03-02 NOTE — H&P (Signed)
 " 18 yof with palpable left breast mass. She has B density breast tissue. She has family history in her mom at age 67. There is an 8 mm mass at 12 oclock. US  shows a 1x0.8x0.7 cm mass. Axillary ultrasound is negative. Biopsy is a grade II IDC that is 100% er/pr positive, her 2 negative and Ki is 10%.  She is here to discuss options  Review of Systems: A complete review of systems was obtained from the patient. I have reviewed this information and discussed as appropriate with the patient. See HPI as well for other ROS.  Review of Systems  All other systems reviewed and are negative.  Medical History: Past Medical History:  Diagnosis Date  Anemia  Anxiety  History of cancer   Patient Active Problem List  Diagnosis  Malignant neoplasm of upper-outer quadrant of left breast in female, estrogen receptor positive (CMS/HHS-HCC)   History reviewed. No pertinent surgical history.   Allergies  Allergen Reactions  Acetaminophen  Other (See Comments), Nausea and Nausea And Vomiting  Esomeprazole  Hives, Rash and Other (See Comments)   Current Outpatient Medications on File Prior to Visit  Medication Sig Dispense Refill  gabapentin  (NEURONTIN ) 600 MG tablet 1/4 tablet Oral twice a day; Duration: 30 days As needed  pantoprazole  (PROTONIX ) 40 MG DR tablet  cetirizine (ZYRTEC) 10 MG tablet Take 10 mg by mouth once daily   Family History  Problem Relation Age of Onset  Breast cancer Mother  Diabetes Father    Social History   Tobacco Use  Smoking Status Every Day  Current packs/day: 0.50  Types: Cigarettes  Smokeless Tobacco Never  Marital status: Single  Tobacco Use  Smoking status: Every Day  Current packs/day: 0.50  Types: Cigarettes  Smokeless tobacco: Never  Substance and Sexual Activity  Alcohol use: Never  Drug use: Never    Objective:   Physical Exam Vitals reviewed.  Constitutional:  Appearance: Normal appearance.  Chest:  Breasts: Right: No inverted nipple,  mass or nipple discharge.  Left: No inverted nipple, mass or nipple discharge.  Comments: 1 cm mass Lymphadenopathy:  Upper Body:  Right upper body: No supraclavicular or axillary adenopathy.  Left upper body: No supraclavicular or axillary adenopathy.  Neurological:  Mental Status: She is alert.    Assessment and Plan:   Malignant neoplasm of upper-outer quadrant of left breast in female, estrogen receptor positive (CMS/HHS-HCC)  Left breast seed guided lumpectomy  We discussed the staging and pathophysiology of breast cancer. We discussed all of the different options for treatment for breast cancer including surgery, chemotherapy, radiation therapy, Herceptin, and antiestrogen therapy.  We discussed a sentinel lymph node biopsy. At 69 with stage I er pos cancer I think based on SOUND and INSEMA as well as our local interpretation we can avoid sn biopsy. She is good with this plan.  We discussed the options for treatment of the breast cancer which included lumpectomy versus a mastectomy. We discussed the performance of the lumpectomy with radioactive seed placement. We discussed a 5-10% chance of a positive margin requiring reexcision in the operating room. We also discussed that she will likely need radiation therapy if she undergoes lumpectomy. We discussed mastectomy and the postoperative care for that as well. Mastectomy can be followed by reconstruction. The decision for lumpectomy vs mastectomy has no impact on decision for chemotherapy. Most mastectomy patients will not need radiation therapy. We discussed that there is no difference in her survival whether she undergoes lumpectomy with radiation therapy or  antiestrogen therapy versus a mastectomy. There is also no real difference between her recurrence in the breast.  We discussed the risks of operation including bleeding, infection, possible reoperation. She understands her further therapy will be based on what her stages at the time  of her operation  "

## 2024-03-03 ENCOUNTER — Ambulatory Visit (HOSPITAL_BASED_OUTPATIENT_CLINIC_OR_DEPARTMENT_OTHER): Payer: Self-pay | Admitting: Anesthesiology

## 2024-03-03 ENCOUNTER — Ambulatory Visit (HOSPITAL_COMMUNITY)
Admission: RE | Admit: 2024-03-03 | Discharge: 2024-03-03 | Disposition: A | Discharge status: Home / Self Care | Attending: General Surgery | Admitting: General Surgery

## 2024-03-03 ENCOUNTER — Encounter (HOSPITAL_COMMUNITY): Admission: RE | Disposition: A | Payer: Self-pay | Source: Home / Self Care | Attending: General Surgery

## 2024-03-03 ENCOUNTER — Encounter (HOSPITAL_COMMUNITY): Payer: Self-pay | Admitting: Vascular Surgery

## 2024-03-03 ENCOUNTER — Encounter (HOSPITAL_COMMUNITY): Payer: Self-pay | Admitting: General Surgery

## 2024-03-03 DIAGNOSIS — Z87891 Personal history of nicotine dependence: Secondary | ICD-10-CM

## 2024-03-03 DIAGNOSIS — Z79899 Other long term (current) drug therapy: Secondary | ICD-10-CM | POA: Insufficient documentation

## 2024-03-03 DIAGNOSIS — Z170 Estrogen receptor positive status [ER+]: Secondary | ICD-10-CM | POA: Diagnosis not present

## 2024-03-03 DIAGNOSIS — K219 Gastro-esophageal reflux disease without esophagitis: Secondary | ICD-10-CM | POA: Insufficient documentation

## 2024-03-03 DIAGNOSIS — I10 Essential (primary) hypertension: Secondary | ICD-10-CM

## 2024-03-03 DIAGNOSIS — Z1732 Human epidermal growth factor receptor 2 negative status: Secondary | ICD-10-CM | POA: Insufficient documentation

## 2024-03-03 DIAGNOSIS — Z1721 Progesterone receptor positive status: Secondary | ICD-10-CM | POA: Insufficient documentation

## 2024-03-03 DIAGNOSIS — C50412 Malignant neoplasm of upper-outer quadrant of left female breast: Secondary | ICD-10-CM | POA: Diagnosis present

## 2024-03-03 DIAGNOSIS — C50912 Malignant neoplasm of unspecified site of left female breast: Secondary | ICD-10-CM

## 2024-03-03 MED ORDER — BUPIVACAINE-EPINEPHRINE 0.25% -1:200000 IJ SOLN
INTRAMUSCULAR | Status: DC | PRN
  Administered 2024-03-03: 10 mL

## 2024-03-03 MED ORDER — MIDAZOLAM HCL 2 MG/2ML IJ SOLN
INTRAMUSCULAR | Status: AC
  Filled 2024-03-03: qty 2

## 2024-03-03 MED ORDER — PHENYLEPHRINE 80 MCG/ML (10ML) SYRINGE FOR IV PUSH (FOR BLOOD PRESSURE SUPPORT)
PREFILLED_SYRINGE | INTRAVENOUS | Status: DC | PRN
  Administered 2024-03-03: 80 ug via INTRAVENOUS

## 2024-03-03 MED ORDER — ENSURE PRE-SURGERY PO LIQD: 296.0000 mL | Freq: Once | ORAL | Status: DC

## 2024-03-03 MED ORDER — CELECOXIB 200 MG PO CAPS
200.0000 mg | ORAL_CAPSULE | Freq: Once | ORAL | Status: DC
  Filled 2024-03-03: qty 1

## 2024-03-03 MED ORDER — BUPIVACAINE-EPINEPHRINE (PF) 0.25% -1:200000 IJ SOLN
INTRAMUSCULAR | Status: AC
  Filled 2024-03-03: qty 30

## 2024-03-03 MED ORDER — PROPOFOL 10 MG/ML IV BOLUS
INTRAVENOUS | Status: DC | PRN
  Administered 2024-03-03: 150 ug/kg/min via INTRAVENOUS
  Administered 2024-03-03: 160 mg via INTRAVENOUS

## 2024-03-03 MED ORDER — CHLORHEXIDINE GLUCONATE 0.12 % MT SOLN
OROMUCOSAL | Status: AC
  Administered 2024-03-03: 15 mL via OROMUCOSAL
  Filled 2024-03-03: qty 15

## 2024-03-03 MED ORDER — 0.9 % SODIUM CHLORIDE (POUR BTL) OPTIME
TOPICAL | Status: DC | PRN
  Administered 2024-03-03: 1000 mL

## 2024-03-03 MED ORDER — LACTATED RINGERS IV SOLN: INTRAVENOUS | Status: DC

## 2024-03-03 MED ORDER — CHLORHEXIDINE GLUCONATE CLOTH 2 % EX PADS: 6.0000 | MEDICATED_PAD | Freq: Once | CUTANEOUS | Status: DC

## 2024-03-03 MED ORDER — FENTANYL CITRATE (PF) 250 MCG/5ML IJ SOLN
INTRAMUSCULAR | Status: DC | PRN
  Administered 2024-03-03 (×2): 50 ug via INTRAVENOUS

## 2024-03-03 MED ORDER — MIDAZOLAM HCL (PF) 2 MG/2ML IJ SOLN
INTRAMUSCULAR | Status: DC | PRN
  Administered 2024-03-03: 2 mg via INTRAVENOUS

## 2024-03-03 MED ORDER — CEFAZOLIN SODIUM-DEXTROSE 2-4 GM/100ML-% IV SOLN
2.0000 g | INTRAVENOUS | Status: AC
  Administered 2024-03-03: 2 g via INTRAVENOUS
  Filled 2024-03-03: qty 100

## 2024-03-03 MED ORDER — ORAL CARE MOUTH RINSE: 15.0000 mL | Freq: Once | OROMUCOSAL | Status: AC

## 2024-03-03 MED ORDER — PROPOFOL 10 MG/ML IV BOLUS
INTRAVENOUS | Status: AC
  Filled 2024-03-03: qty 20

## 2024-03-03 MED ORDER — FENTANYL CITRATE (PF) 100 MCG/2ML IJ SOLN
INTRAMUSCULAR | Status: AC
  Filled 2024-03-03: qty 2

## 2024-03-03 MED ORDER — CHLORHEXIDINE GLUCONATE 0.12 % MT SOLN: 15.0000 mL | Freq: Once | OROMUCOSAL | Status: AC

## 2024-03-03 MED ORDER — ACETAMINOPHEN 500 MG PO TABS: 1000.0000 mg | ORAL_TABLET | Freq: Once | ORAL | Status: DC

## 2024-03-03 MED ORDER — ONDANSETRON HCL 4 MG/2ML IJ SOLN
INTRAMUSCULAR | Status: DC | PRN
  Administered 2024-03-03: 4 mg via INTRAVENOUS

## 2024-03-03 MED ORDER — CHLORHEXIDINE GLUCONATE CLOTH 2 % EX PADS
6.0000 | MEDICATED_PAD | Freq: Once | CUTANEOUS | Status: AC
  Administered 2024-03-03: 6 via TOPICAL

## 2024-03-03 MED ORDER — LIDOCAINE 2% (20 MG/ML) 5 ML SYRINGE
INTRAMUSCULAR | Status: DC | PRN
  Administered 2024-03-03: 60 mg via INTRAVENOUS

## 2024-03-03 NOTE — Anesthesia Procedure Notes (Signed)
 Procedure Name: LMA Insertion Date/Time: 03/03/2024 7:38 AM  Performed by: Jerl Donald LABOR, CRNAPre-anesthesia Checklist: Patient identified, Emergency Drugs available, Suction available and Patient being monitored Patient Re-evaluated:Patient Re-evaluated prior to induction Oxygen Delivery Method: Circle System Utilized Preoxygenation: Pre-oxygenation with 100% oxygen Induction Type: IV induction Ventilation: Mask ventilation without difficulty LMA: LMA inserted LMA Size: 4.0 Number of attempts: 1 Airway Equipment and Method: Bite block Placement Confirmation: ETT inserted through vocal cords under direct vision, positive ETCO2 and breath sounds checked- equal and bilateral Tube secured with: Tape Dental Injury: Teeth and Oropharynx as per pre-operative assessment

## 2024-03-03 NOTE — Interval H&P Note (Signed)
 History and Physical Interval Note:  03/03/2024 7:08 AM  Gabriela [name]  has presented today for surgery, with the diagnosis of LEFT BREAST CANCER.  The various methods of treatment have been discussed with the patient and family. After consideration of risks, benefits and other options for treatment, the patient has consented to  Procedures with comments: LUMPECTOMY WITH MAGNETIC MARKER LOCALIZATION (Left) - LEFT BREAST SEED-GUIDED LUMPECTOMY as a surgical intervention.  The patient's history has been reviewed, patient examined, no change in status, stable for surgery.  I have reviewed the patient's chart and labs.  Questions were answered to the patient's satisfaction.     [name]

## 2024-03-03 NOTE — Op Note (Signed)
 Preoperative diagnosis: clinical stage I left breast cancer Postoperative diagnosis: Same as above Procedure: Left breast seed guided lumpectomy Surgeon: Dr. Adina Bury Anesthesia: General Estimated blood loss: Minimal Specimens:  Left breast lumpectomy marked with paint Additional superior margin marked short superior, long lateral and double deep Complications: None Sponge needle count was correct completion Disposition recovery stable addition   Indications: 32 yof with palpable left breast mass. She has B density breast tissue. She has family history in her mom at age 40. There is an 8 mm mass at 12 oclock. US  shows a 1x0.8x0.7 cm mass. Axillary ultrasound is negative. Biopsy is a grade II IDC that is 100% er/pr positive, her 2 negative and Ki is 10%. We discussed lumpectomy   Procedure: After informed consent was obtained she was taken to the operating room.  She had a seed placed prior to beginning.  I had these mammograms in the operating room.  She was given antibiotics.  SCDs were placed.  She was placed under general anesthesia without complication.  She was prepped and draped in a standard sterile surgical fashion.  A surgical timeout was then performed.   I located the seed in the central breast. I infiltrated Marcaine  throughout this area.  I then made a periareolar incision in order to hide the scar later.  I dissected to the seed using the probe for guidance.  I then removed the seed and some of the surrounding tissue.  Mammogram confirmed removal of the seed and the clip.  I then obtained hemostasis. I thought I was close to a  margin and removed this as above. I closed the breast tissue with 2-0 Vicryl.  The skin was closed with 3-0 Vicryl and 5-0 Monocryl.  Glue and Steri-Strips were applied.  She tolerated this well was extubated transferred recovery stable.

## 2024-03-03 NOTE — Discharge Instructions (Signed)
 [facility] Office Phone Number [phone]  POST OP INSTRUCTIONS Take 400 mg of ibuprofen every 8 hours or 650 mg tylenol  every 6 hours for next 72 hours then as needed. Use ice several times daily also.  A prescription for pain medication may be given to you upon discharge.  Take your pain medication as prescribed, if needed.  If narcotic pain medicine is not needed, then you may take acetaminophen  (Tylenol ), naprosyn (Alleve) or ibuprofen (Advil) as needed. Take your usually prescribed medications unless otherwise directed If you need a refill on your pain medication, please contact your pharmacy.  They will contact our office to request authorization.  Prescriptions will not be filled after 5pm or on week-ends. You should eat very light the first 24 hours after surgery, such as soup, crackers, pudding, etc.  Resume your normal diet the day after surgery. Most patients will experience some swelling and bruising in the breast.  Ice packs and a good support bra will help.  Wear the breast binder provided or a sports bra for 72 hours day and night.  After that wear a sports bra during the day until you return to the office. Swelling and bruising can take several days to resolve.  It is common to experience some constipation if taking pain medication after surgery.  Increasing fluid intake and taking a stool softener will usually help or prevent this problem from occurring.  A mild laxative (Milk of Magnesia or Miralax) should be taken according to package directions if there are no bowel movements after 48 hours. I used skin glue on the incision, you may shower in 24 hours.  The glue will flake off over the next 2-3 weeks as will the steristrips.   Any sutures or staples will be removed at the office during your follow-up visit. ACTIVITIES:  You may resume regular daily activities (gradually increasing) beginning the next day.  Wearing a good support bra or sports bra minimizes pain and  swelling.  You may have sexual intercourse when it is comfortable. You may drive when you no longer are taking prescription pain medication, you can comfortably wear a seatbelt, and you can safely maneuver your car and apply brakes. RETURN TO WORK:  ______________________________________________________________________________________ Gabriela [name] should see your doctor in the office for a follow-up appointment approximately two to three weeks after your surgery.  Your doctor's nurse will typically make your follow-up appointment when she calls you with your pathology report.  Expect your pathology report 3-4 business days after your surgery.  You may call to check if you do not hear from us  after three days.  WHEN TO CALL DR Taniela Feltus: Fever over 101.0 Nausea and/or vomiting. Extreme swelling or bruising. Continued bleeding from incision. Increased pain, redness, or drainage from the incision.  The clinic staff is available to answer your questions during regular business hours.  Please don't hesitate to call and ask to speak to one of the nurses for clinical concerns.  If you have a medical emergency, go to the nearest emergency room or call 911.  A surgeon from [facility] is always on call at the hospital.  For further questions, please visit [email or web address]

## 2024-03-03 NOTE — Anesthesia Postprocedure Evaluation (Signed)
"   Anesthesia Post Note  Patient: Raphaella E Cubillos  Procedure(s) Performed: LUMPECTOMY WITH MAGNETIC MARKER LOCALIZATION (Left: Breast)     Patient location during evaluation: PACU Anesthesia Type: General Level of consciousness: awake and alert Pain management: pain level controlled Vital Signs Assessment: post-procedure vital signs reviewed and stable Respiratory status: spontaneous breathing, nonlabored ventilation, respiratory function stable and patient connected to nasal cannula oxygen Cardiovascular status: blood pressure returned to baseline and stable Postop Assessment: no apparent nausea or vomiting Anesthetic complications: no   No notable events documented.  Last Vitals:  Vitals:   03/03/24 0845 03/03/24 0900  BP: 120/75 124/72  Pulse: 74 72  Resp: 14 13  Temp:  36.6 C  SpO2: 93% 94%    Last Pain:  Vitals:   03/03/24 0900  TempSrc:   PainSc: 3                  Noemi Bellissimo P Faria Casella      "

## 2024-03-03 NOTE — Transfer of Care (Signed)
 Immediate Anesthesia Transfer of Care Note  Patient: Gabriela [name]  Procedure(s) Performed: LUMPECTOMY WITH MAGNETIC MARKER LOCALIZATION (Left: Breast)  Patient Location: PACU  Anesthesia Type:General  Level of Consciousness: awake, alert , and oriented  Airway & Oxygen Therapy: Patient Spontanous Breathing  Post-op Assessment: Report given to RN, Post -op Vital signs reviewed and stable, and Patient moving all extremities  Post vital signs: Reviewed and stable  Last Vitals:  Vitals Value Taken Time  BP 113/77 03/03/24 08:26  Temp 36.7 C 03/03/24 08:26  Pulse 80 03/03/24 08:27  Resp 18 03/03/24 08:27  SpO2 94 % 03/03/24 08:27  Vitals shown include unfiled device data.  Last Pain:  Vitals:   03/03/24 0630  TempSrc:   PainSc: 0-No pain         Complications: No notable events documented.

## 2024-03-04 LAB — SURGICAL PATHOLOGY

## 2024-03-07 ENCOUNTER — Telehealth: Payer: Self-pay | Admitting: *Deleted

## 2024-03-07 ENCOUNTER — Encounter: Payer: Self-pay | Admitting: *Deleted

## 2024-03-07 NOTE — Telephone Encounter (Signed)
 Ordered oncotype per Dr. Pamelia Hoit. Sent requisition to pathology and [facility].

## 2024-03-14 ENCOUNTER — Encounter: Payer: Self-pay | Admitting: Sports Medicine

## 2024-03-14 DIAGNOSIS — M5442 Lumbago with sciatica, left side: Secondary | ICD-10-CM

## 2024-03-15 MED ORDER — HYDROCODONE-IBUPROFEN 7.5-200 MG PO TABS: 1.0000 | ORAL_TABLET | Freq: Three times a day (TID) | ORAL | 0 refills | Status: AC | PRN

## 2024-03-15 NOTE — Telephone Encounter (Signed)
 Refilled for dr. Harvey as he's out of the office.  Reviewed database.

## 2024-03-16 ENCOUNTER — Encounter (HOSPITAL_COMMUNITY): Payer: Self-pay

## 2024-03-17 ENCOUNTER — Encounter (HOSPITAL_COMMUNITY): Payer: Self-pay

## 2024-03-18 ENCOUNTER — Encounter: Payer: Self-pay | Admitting: Diagnostic Radiology

## 2024-03-30 ENCOUNTER — Inpatient Hospital Stay: Attending: Hematology and Oncology | Admitting: Hematology and Oncology
